# Patient Record
Sex: Male | Born: 1980 | Race: White | Hispanic: No | Marital: Single | Smoking: Current every day smoker
Health system: Southern US, Community
[De-identification: ages and names within clinical notes are randomized; demographics above are authoritative.]

## PROBLEM LIST (undated history)

## (undated) DIAGNOSIS — F319 Bipolar disorder, unspecified: Secondary | ICD-10-CM

## (undated) DIAGNOSIS — I10 Essential (primary) hypertension: Secondary | ICD-10-CM

## (undated) HISTORY — DX: Essential (primary) hypertension: I10

---
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
## 2000-11-16 ENCOUNTER — Encounter: Admission: RE | Admit: 2000-11-16 | Discharge: 2000-11-16 | Payer: Self-pay | Admitting: Nephrology

## 2000-11-16 ENCOUNTER — Encounter: Payer: Self-pay | Admitting: Nephrology

## 2001-09-16 ENCOUNTER — Emergency Department (HOSPITAL_COMMUNITY): Admission: EM | Admit: 2001-09-16 | Discharge: 2001-09-16 | Payer: Self-pay | Admitting: Emergency Medicine

## 2001-10-31 ENCOUNTER — Emergency Department (HOSPITAL_COMMUNITY): Admission: EM | Admit: 2001-10-31 | Discharge: 2001-11-01 | Payer: Self-pay | Admitting: Emergency Medicine

## 2002-01-20 ENCOUNTER — Inpatient Hospital Stay (HOSPITAL_COMMUNITY): Admission: EM | Admit: 2002-01-20 | Discharge: 2002-01-24 | Payer: Self-pay | Admitting: Psychiatry

## 2002-02-05 ENCOUNTER — Encounter: Payer: Self-pay | Admitting: Emergency Medicine

## 2002-02-05 ENCOUNTER — Emergency Department (HOSPITAL_COMMUNITY): Admission: EM | Admit: 2002-02-05 | Discharge: 2002-02-05 | Payer: Self-pay

## 2002-02-07 ENCOUNTER — Emergency Department (HOSPITAL_COMMUNITY): Admission: EM | Admit: 2002-02-07 | Discharge: 2002-02-07 | Payer: Self-pay | Admitting: *Deleted

## 2002-04-17 ENCOUNTER — Inpatient Hospital Stay (HOSPITAL_COMMUNITY): Admission: EM | Admit: 2002-04-17 | Discharge: 2002-04-26 | Payer: Self-pay | Admitting: Psychiatry

## 2003-06-02 ENCOUNTER — Inpatient Hospital Stay (HOSPITAL_COMMUNITY): Admission: EM | Admit: 2003-06-02 | Discharge: 2003-06-07 | Payer: Self-pay | Admitting: Psychiatry

## 2003-08-16 ENCOUNTER — Inpatient Hospital Stay (HOSPITAL_COMMUNITY): Admission: EM | Admit: 2003-08-16 | Discharge: 2003-08-19 | Payer: Self-pay | Admitting: Psychiatry

## 2003-10-22 ENCOUNTER — Inpatient Hospital Stay (HOSPITAL_COMMUNITY): Admission: EM | Admit: 2003-10-22 | Discharge: 2003-10-25 | Payer: Self-pay | Admitting: Psychiatry

## 2003-12-23 ENCOUNTER — Other Ambulatory Visit: Payer: Self-pay

## 2004-08-04 ENCOUNTER — Emergency Department: Payer: Self-pay | Admitting: Unknown Physician Specialty

## 2004-08-12 ENCOUNTER — Emergency Department (HOSPITAL_COMMUNITY): Admission: EM | Admit: 2004-08-12 | Discharge: 2004-08-13 | Payer: Self-pay

## 2004-08-21 ENCOUNTER — Emergency Department: Payer: Self-pay | Admitting: Emergency Medicine

## 2004-10-02 ENCOUNTER — Emergency Department: Payer: Self-pay | Admitting: Internal Medicine

## 2004-10-02 ENCOUNTER — Other Ambulatory Visit: Payer: Self-pay

## 2004-11-08 ENCOUNTER — Emergency Department: Payer: Self-pay | Admitting: Emergency Medicine

## 2004-11-27 ENCOUNTER — Emergency Department: Payer: Self-pay | Admitting: Emergency Medicine

## 2004-12-20 ENCOUNTER — Emergency Department: Payer: Self-pay | Admitting: Emergency Medicine

## 2004-12-24 ENCOUNTER — Emergency Department: Payer: Self-pay | Admitting: General Practice

## 2004-12-24 ENCOUNTER — Other Ambulatory Visit: Payer: Self-pay

## 2005-01-05 ENCOUNTER — Inpatient Hospital Stay (HOSPITAL_COMMUNITY): Admission: EM | Admit: 2005-01-05 | Discharge: 2005-01-08 | Payer: Self-pay | Admitting: Psychiatry

## 2005-01-05 ENCOUNTER — Ambulatory Visit: Payer: Self-pay | Admitting: Psychiatry

## 2005-03-21 ENCOUNTER — Emergency Department (HOSPITAL_COMMUNITY): Admission: EM | Admit: 2005-03-21 | Discharge: 2005-03-21 | Payer: Self-pay | Admitting: Emergency Medicine

## 2005-06-21 ENCOUNTER — Emergency Department: Payer: Self-pay | Admitting: Emergency Medicine

## 2005-08-24 ENCOUNTER — Emergency Department: Payer: Self-pay | Admitting: Emergency Medicine

## 2007-08-26 ENCOUNTER — Emergency Department: Payer: Self-pay | Admitting: Emergency Medicine

## 2007-11-01 ENCOUNTER — Emergency Department: Payer: Self-pay | Admitting: Emergency Medicine

## 2007-11-01 ENCOUNTER — Other Ambulatory Visit: Payer: Self-pay

## 2007-12-08 ENCOUNTER — Emergency Department: Payer: Self-pay | Admitting: Emergency Medicine

## 2007-12-12 ENCOUNTER — Emergency Department (HOSPITAL_COMMUNITY): Admission: EM | Admit: 2007-12-12 | Discharge: 2007-12-12 | Payer: Self-pay | Admitting: Emergency Medicine

## 2007-12-20 ENCOUNTER — Emergency Department (HOSPITAL_COMMUNITY): Admission: EM | Admit: 2007-12-20 | Discharge: 2007-12-22 | Payer: Self-pay | Admitting: Emergency Medicine

## 2008-01-10 ENCOUNTER — Inpatient Hospital Stay (HOSPITAL_COMMUNITY): Admission: RE | Admit: 2008-01-10 | Discharge: 2008-01-17 | Payer: Self-pay | Admitting: Psychiatry

## 2008-01-10 ENCOUNTER — Other Ambulatory Visit: Payer: Self-pay

## 2008-01-10 ENCOUNTER — Ambulatory Visit: Payer: Self-pay | Admitting: Psychiatry

## 2008-01-27 ENCOUNTER — Emergency Department (HOSPITAL_COMMUNITY): Admission: EM | Admit: 2008-01-27 | Discharge: 2008-01-27 | Payer: Self-pay | Admitting: Emergency Medicine

## 2008-02-02 ENCOUNTER — Inpatient Hospital Stay (HOSPITAL_COMMUNITY): Admission: RE | Admit: 2008-02-02 | Discharge: 2008-02-07 | Payer: Self-pay | Admitting: Psychiatry

## 2008-02-02 ENCOUNTER — Other Ambulatory Visit: Payer: Self-pay

## 2008-02-07 ENCOUNTER — Emergency Department (HOSPITAL_COMMUNITY): Admission: EM | Admit: 2008-02-07 | Discharge: 2008-02-07 | Payer: Self-pay | Admitting: Emergency Medicine

## 2008-02-08 ENCOUNTER — Ambulatory Visit: Payer: Self-pay | Admitting: Psychiatry

## 2008-02-08 ENCOUNTER — Inpatient Hospital Stay (HOSPITAL_COMMUNITY): Admission: AD | Admit: 2008-02-08 | Discharge: 2008-02-12 | Payer: Self-pay | Admitting: Psychiatry

## 2008-03-03 ENCOUNTER — Emergency Department (HOSPITAL_COMMUNITY): Admission: EM | Admit: 2008-03-03 | Discharge: 2008-03-03 | Payer: Self-pay | Admitting: Emergency Medicine

## 2008-03-04 ENCOUNTER — Emergency Department (HOSPITAL_COMMUNITY): Admission: EM | Admit: 2008-03-04 | Discharge: 2008-03-04 | Payer: Self-pay | Admitting: Emergency Medicine

## 2008-04-10 ENCOUNTER — Other Ambulatory Visit: Payer: Self-pay | Admitting: Emergency Medicine

## 2008-04-10 ENCOUNTER — Ambulatory Visit: Payer: Self-pay | Admitting: Psychiatry

## 2008-04-11 ENCOUNTER — Inpatient Hospital Stay (HOSPITAL_COMMUNITY): Admission: RE | Admit: 2008-04-11 | Discharge: 2008-04-18 | Payer: Self-pay | Admitting: Psychiatry

## 2008-05-13 ENCOUNTER — Other Ambulatory Visit: Payer: Self-pay

## 2008-05-13 ENCOUNTER — Other Ambulatory Visit: Payer: Self-pay | Admitting: Emergency Medicine

## 2008-05-14 ENCOUNTER — Ambulatory Visit: Payer: Self-pay | Admitting: *Deleted

## 2008-05-14 ENCOUNTER — Inpatient Hospital Stay (HOSPITAL_COMMUNITY): Admission: EM | Admit: 2008-05-14 | Discharge: 2008-05-16 | Payer: Self-pay | Admitting: *Deleted

## 2008-06-06 ENCOUNTER — Emergency Department (HOSPITAL_COMMUNITY): Admission: EM | Admit: 2008-06-06 | Discharge: 2008-06-08 | Payer: Self-pay | Admitting: Emergency Medicine

## 2008-07-18 ENCOUNTER — Other Ambulatory Visit: Payer: Self-pay | Admitting: Emergency Medicine

## 2008-07-18 ENCOUNTER — Other Ambulatory Visit: Payer: Self-pay

## 2008-07-19 ENCOUNTER — Inpatient Hospital Stay (HOSPITAL_COMMUNITY): Admission: RE | Admit: 2008-07-19 | Discharge: 2008-07-25 | Payer: Self-pay | Admitting: Psychiatry

## 2008-07-19 ENCOUNTER — Ambulatory Visit: Payer: Self-pay | Admitting: Psychiatry

## 2008-10-08 ENCOUNTER — Emergency Department (HOSPITAL_COMMUNITY): Admission: EM | Admit: 2008-10-08 | Discharge: 2008-10-08 | Payer: Self-pay | Admitting: Emergency Medicine

## 2008-10-15 ENCOUNTER — Other Ambulatory Visit: Payer: Self-pay | Admitting: Emergency Medicine

## 2008-10-16 ENCOUNTER — Inpatient Hospital Stay (HOSPITAL_COMMUNITY): Admission: EM | Admit: 2008-10-16 | Discharge: 2008-10-18 | Payer: Self-pay | Admitting: Psychiatry

## 2008-10-16 ENCOUNTER — Ambulatory Visit: Payer: Self-pay | Admitting: Psychiatry

## 2008-11-05 ENCOUNTER — Other Ambulatory Visit: Payer: Self-pay | Admitting: *Deleted

## 2008-11-06 ENCOUNTER — Inpatient Hospital Stay (HOSPITAL_COMMUNITY): Admission: EM | Admit: 2008-11-06 | Discharge: 2008-11-08 | Payer: Self-pay | Admitting: Psychiatry

## 2009-01-19 ENCOUNTER — Other Ambulatory Visit: Payer: Self-pay | Admitting: Emergency Medicine

## 2009-01-20 ENCOUNTER — Ambulatory Visit: Payer: Self-pay | Admitting: Psychiatry

## 2009-01-20 ENCOUNTER — Inpatient Hospital Stay (HOSPITAL_COMMUNITY): Admission: AD | Admit: 2009-01-20 | Discharge: 2009-01-24 | Payer: Self-pay | Admitting: Psychiatry

## 2009-05-05 ENCOUNTER — Emergency Department (HOSPITAL_COMMUNITY): Admission: EM | Admit: 2009-05-05 | Discharge: 2009-05-06 | Payer: Self-pay | Admitting: Emergency Medicine

## 2009-08-07 ENCOUNTER — Emergency Department (HOSPITAL_COMMUNITY): Admission: EM | Admit: 2009-08-07 | Discharge: 2009-08-07 | Payer: Self-pay | Admitting: Emergency Medicine

## 2009-09-22 IMAGING — CR RIGHT HAND - COMPLETE 3+ VIEW
1 series · 3 of 3 positions shown · non-contrast
Comparison: none

REASON FOR EXAM: fall
COMMENTS:

[Series 1: view not recorded · 0.17mm/px · 3 of 3 slices shown]
[im 1/3]
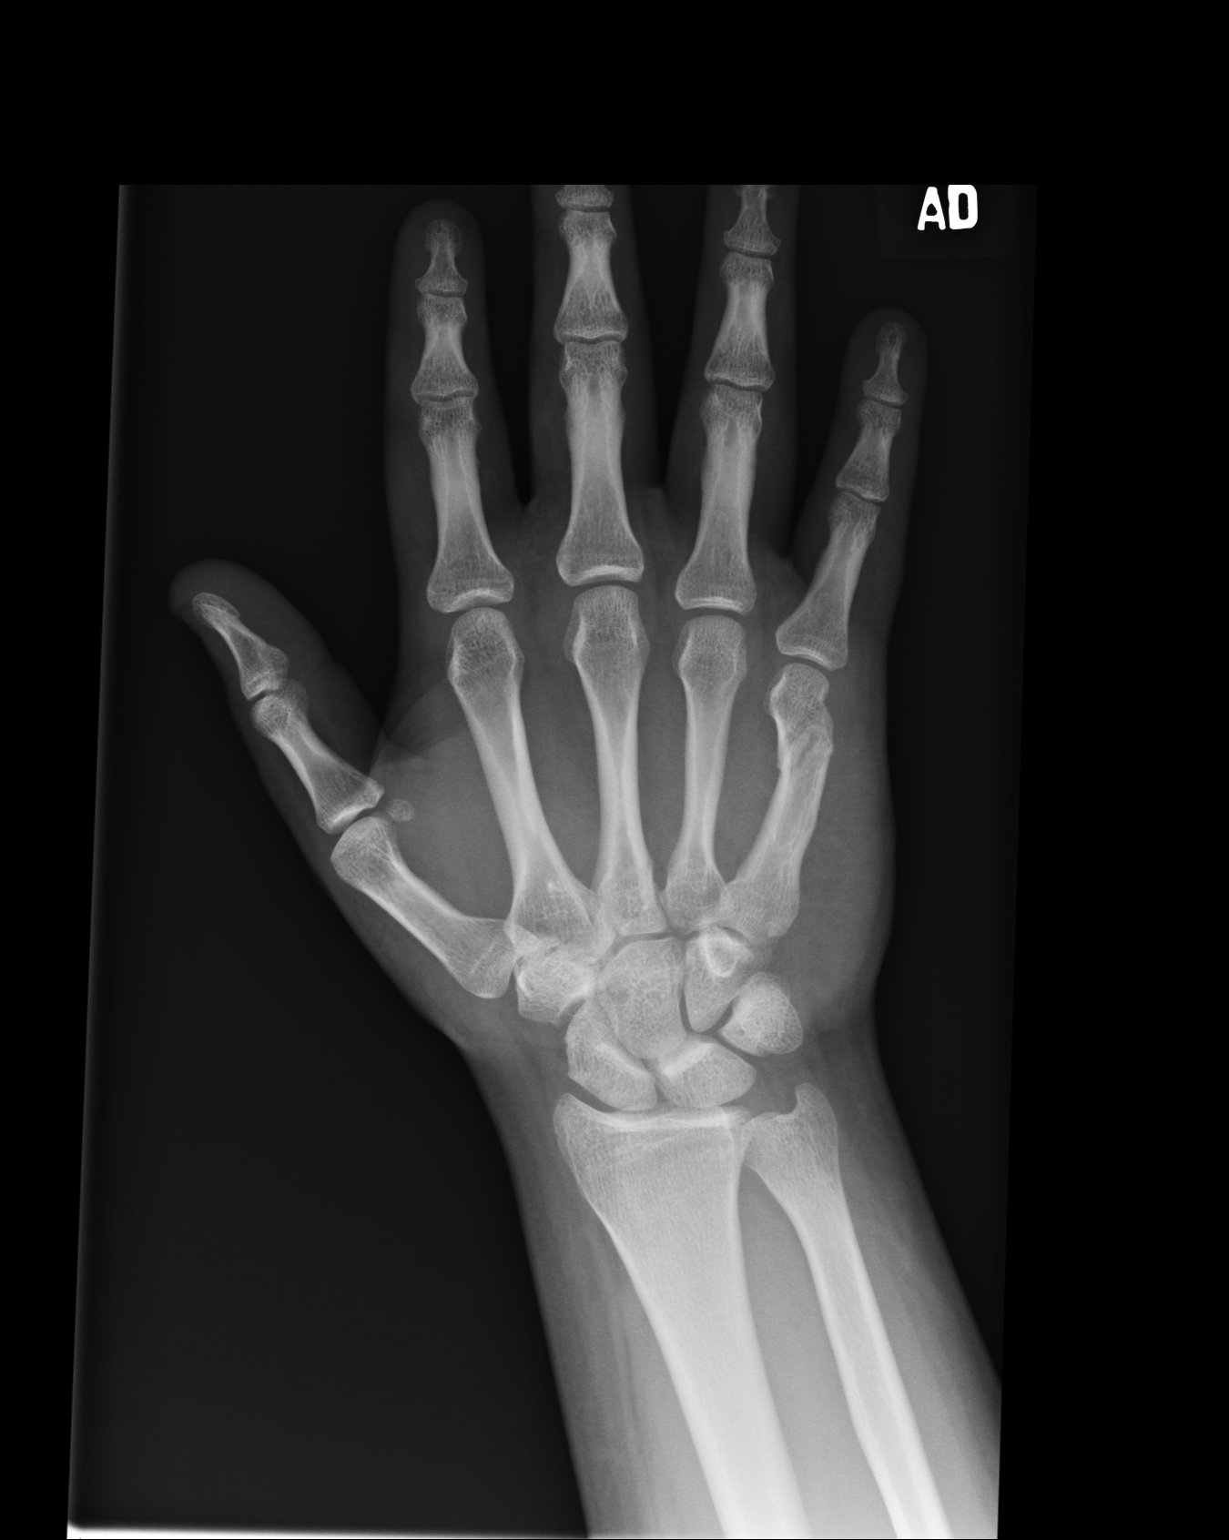
[im 2/3]
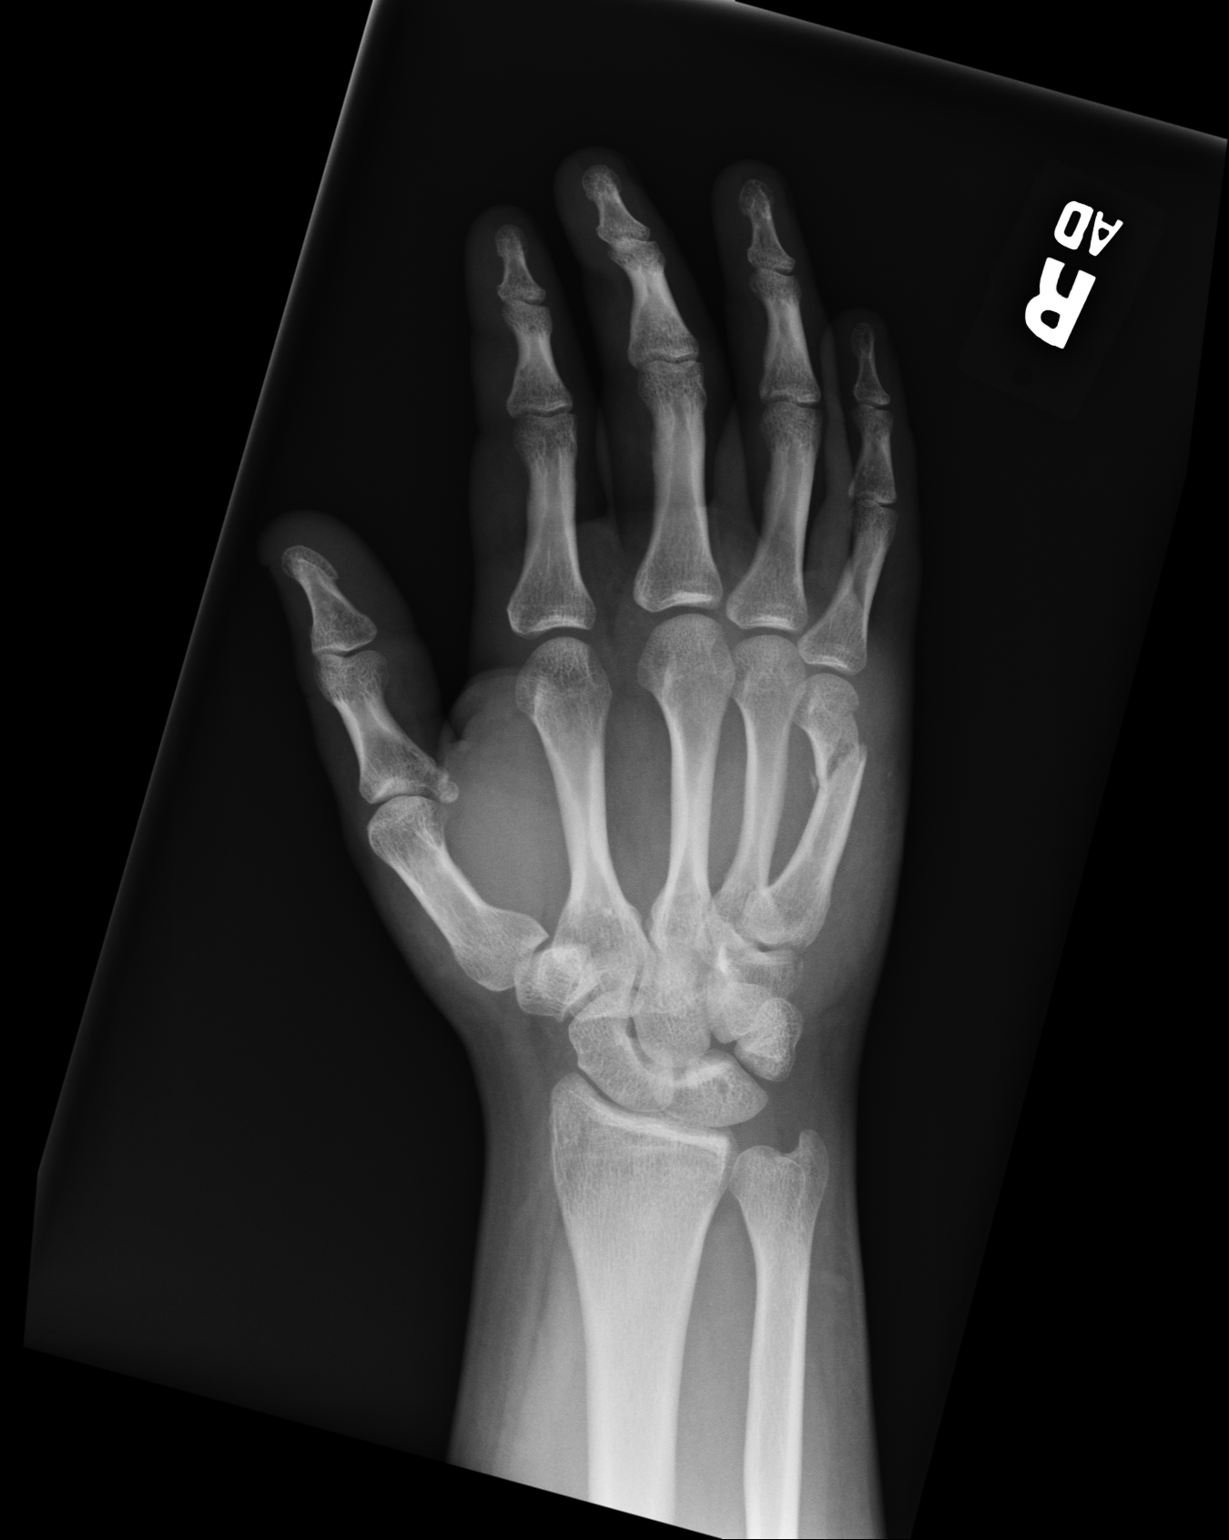
[im 3/3]
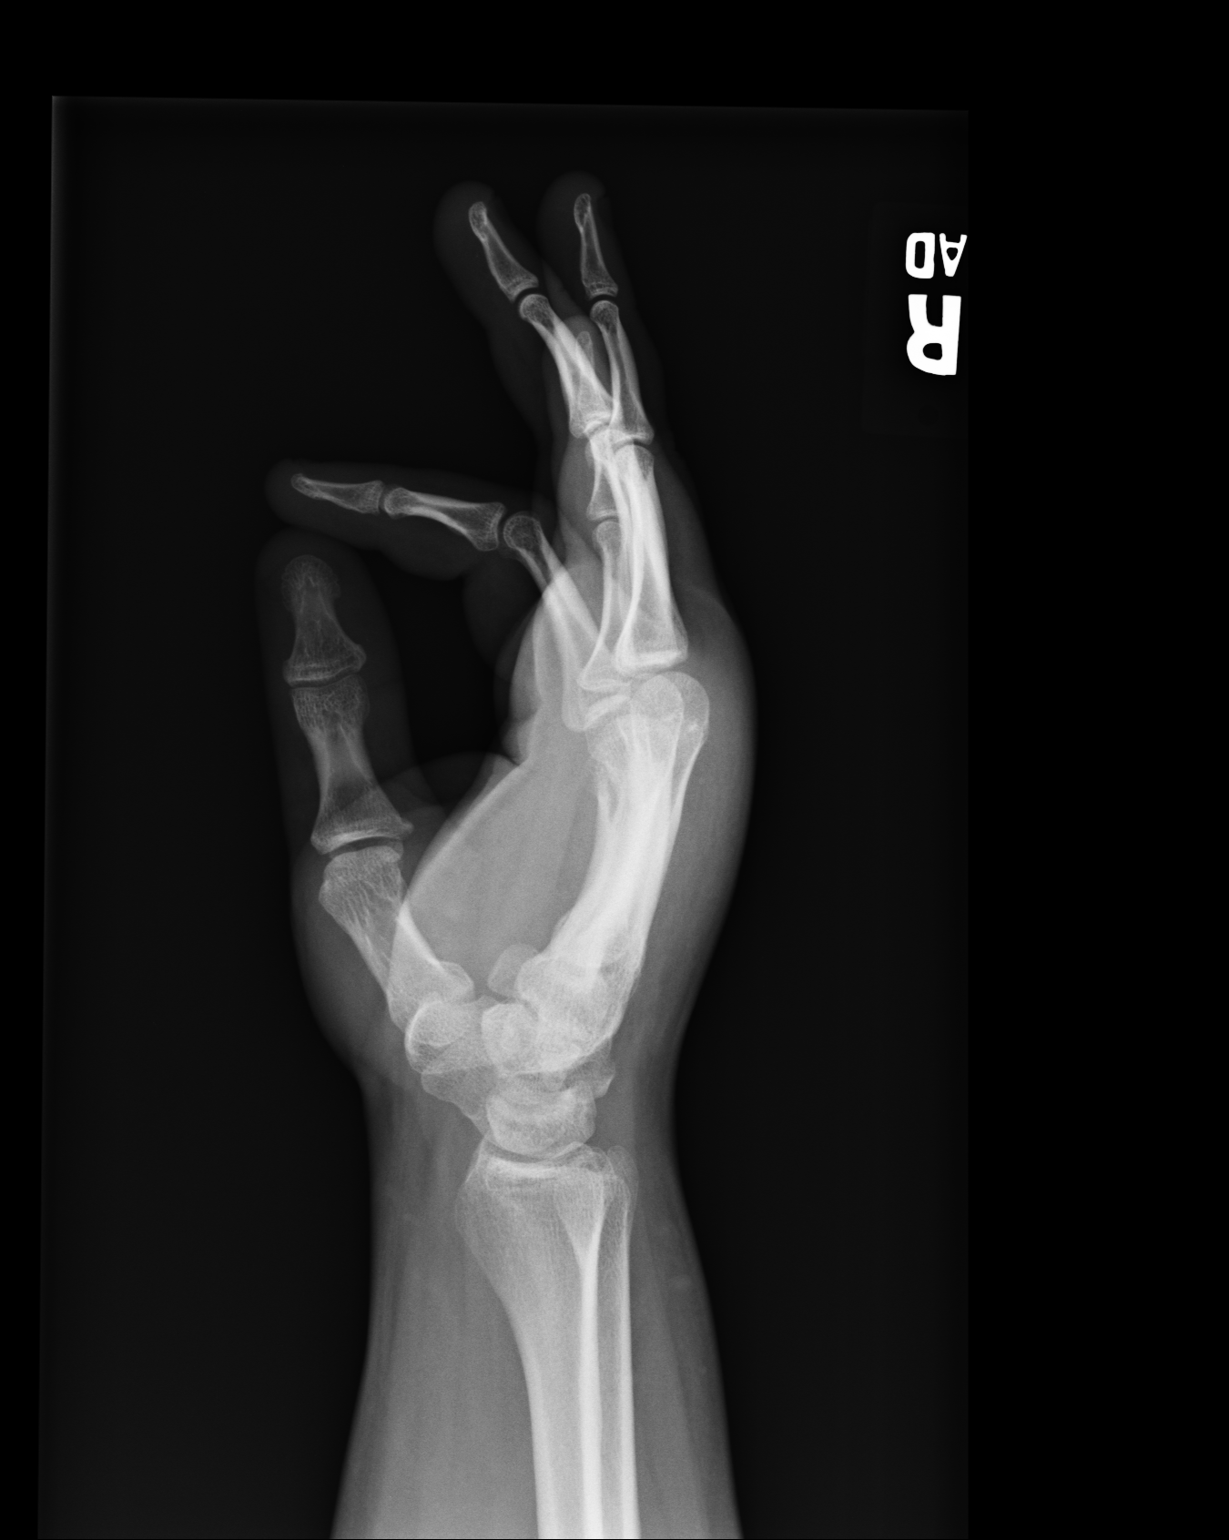

[3 of 3 positions shown; findings below may reference images not displayed]

PROCEDURE:     DXR - DXR HAND RT COMPLETE W/OBLIQUES  - August 26, 2007  [DATE]

RESULT:       A spiral partially comminuted fracture is demonstrated along
the distal aspect of the fifth metacarpal.  Distal fracture fragment
demonstrates dorsal angulation. No further evidence of fracture or
dislocation is appreciated.
IMPRESSION: Fifth metacarpal fracture as described above.

## 2010-01-08 IMAGING — CT CT HEAD W/O CM
1 series · 16 of 30 positions shown, 20 images · IV contrast (agent unspecified)
Comparison: None.

CLINICAL DATA: Headache. Fall.
 HEAD CT WITHOUT CONTRAST:
TECHNIQUE: Contiguous axial images were obtained from the base of the skull through the vertex according to standard protocol without contrast.

[Series 2: head routine 4.8 h47s · axial · 0.46mm/px · z∈[+1310,+1467]mm · 16 of 36 slices shown, 20 images]
[im 2/36  brain]
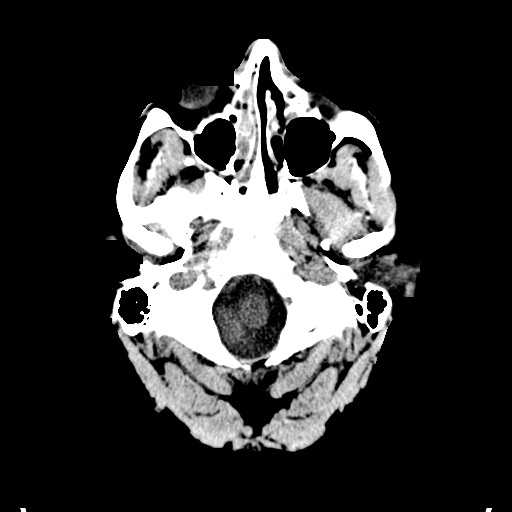
[im 2/36  bone]
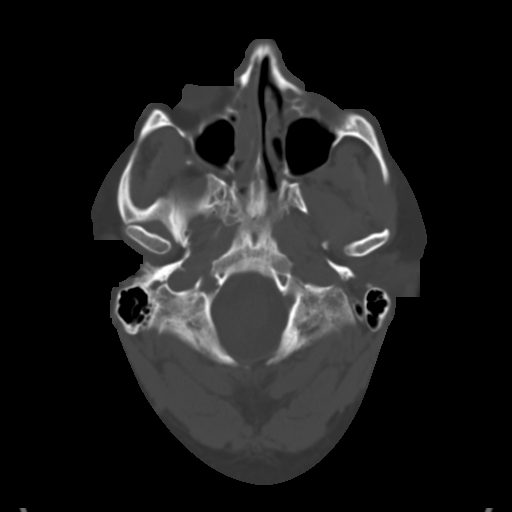
[im 4/36  brain]
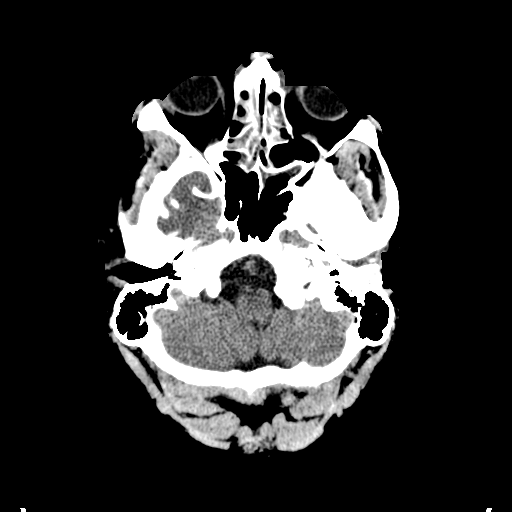
[im 7/36  brain]
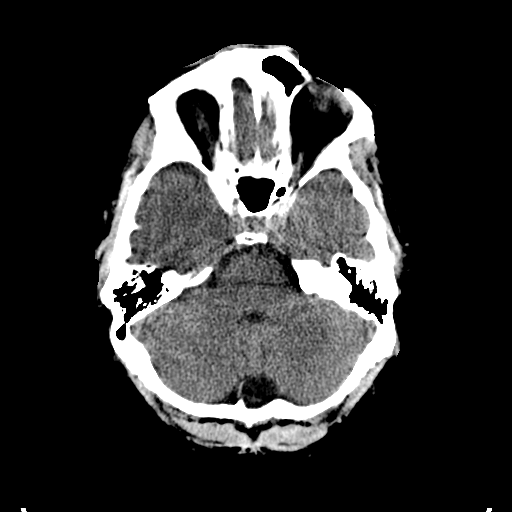
[im 9/36  brain]
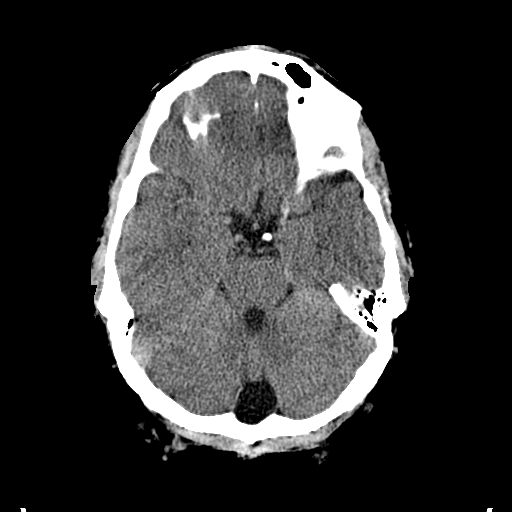
[im 10/36  brain]
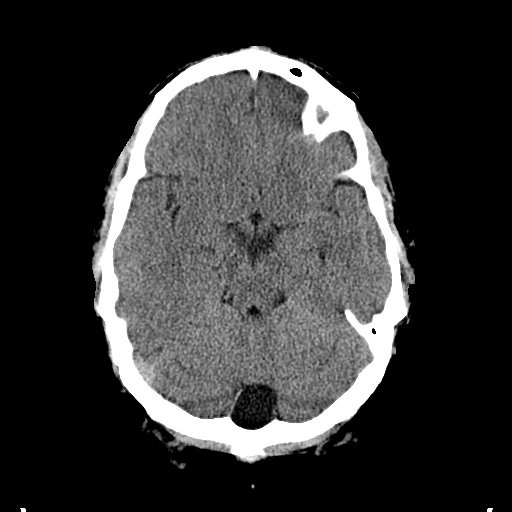
[im 10/36  bone]
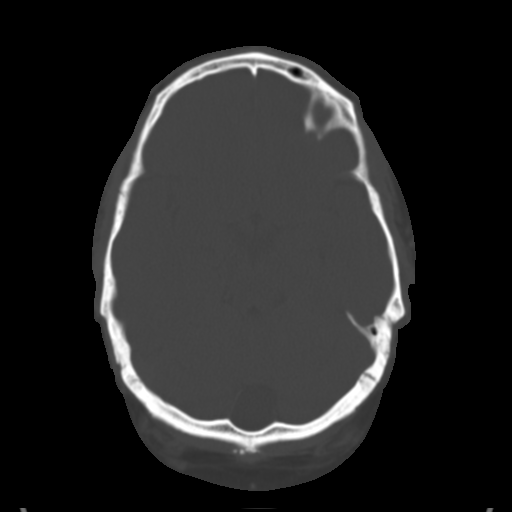
[im 13/36  brain]
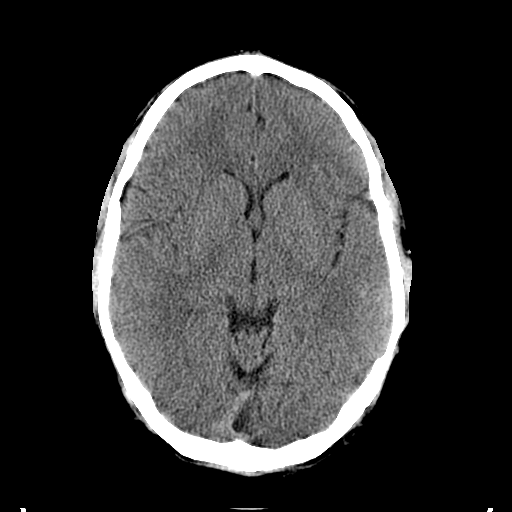
[im 15/36  brain]
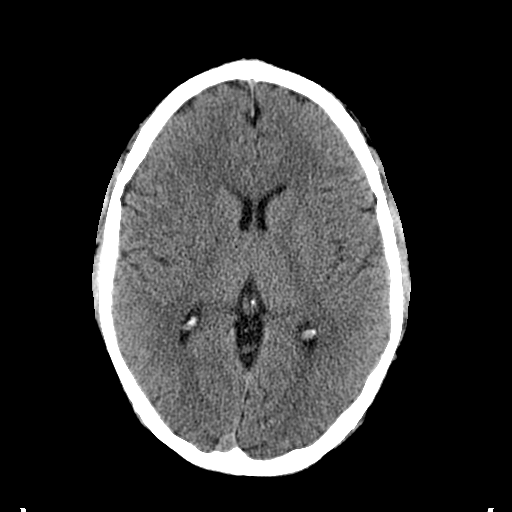
[im 17/36  brain]
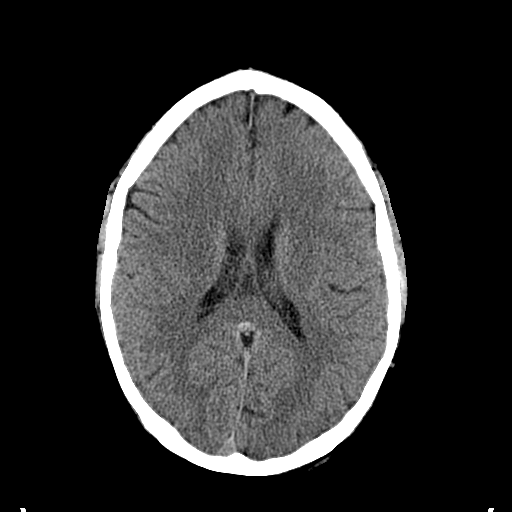
[im 19/36  brain]
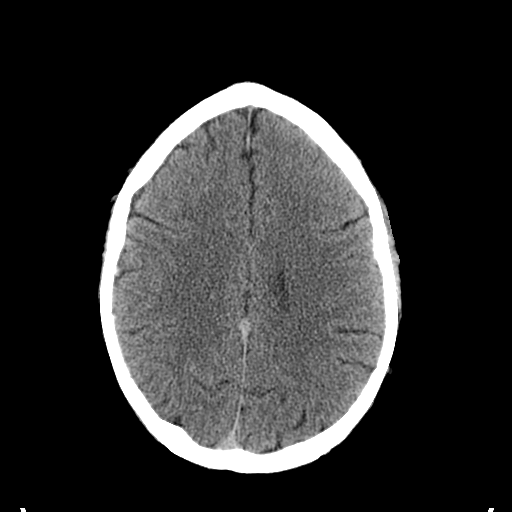
[im 19/36  bone]
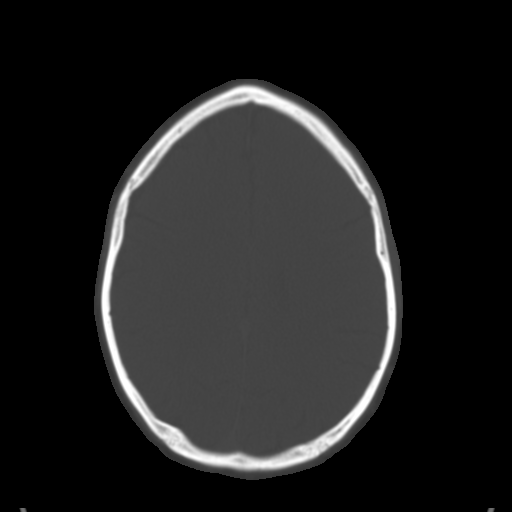
[im 21/36  brain]
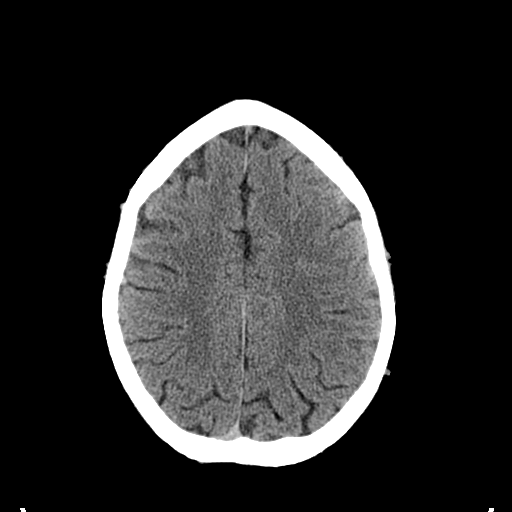
[im 23/36  brain]
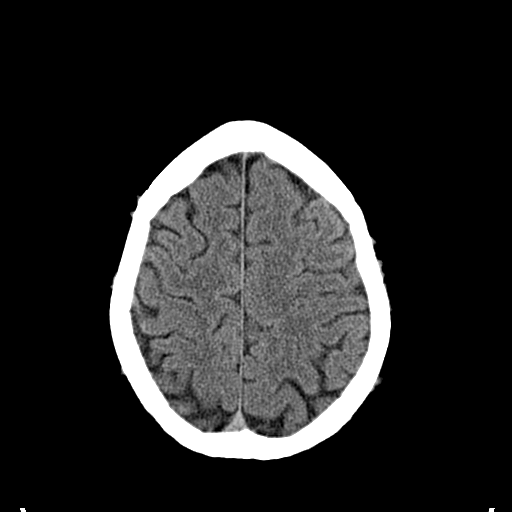
[im 26/36  brain]
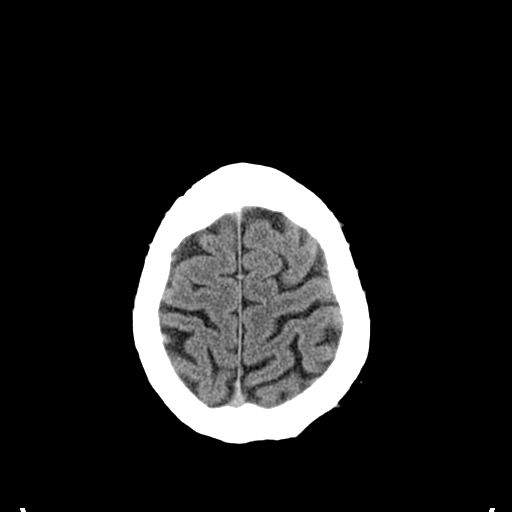
[im 27/36  brain]
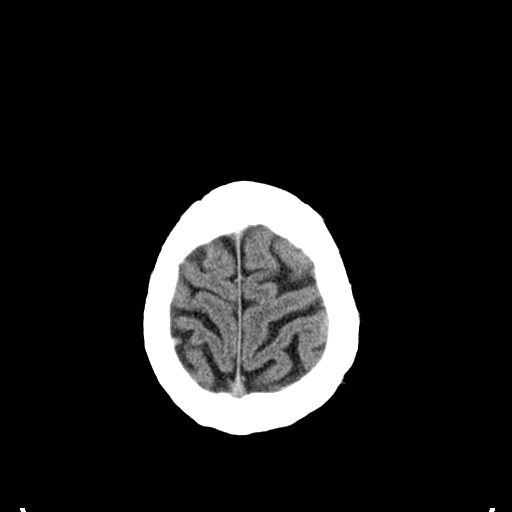
[im 27/36  bone]
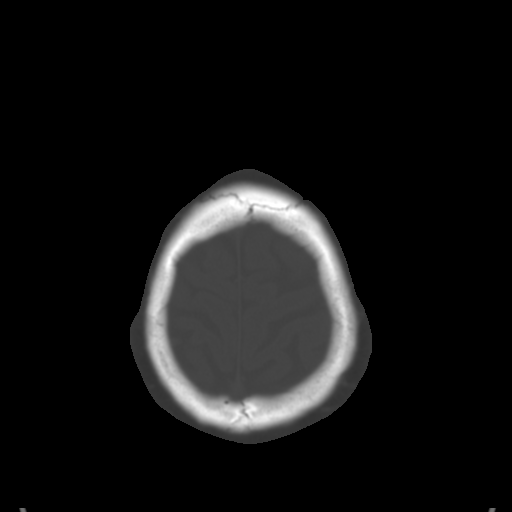
[im 29/36  brain]
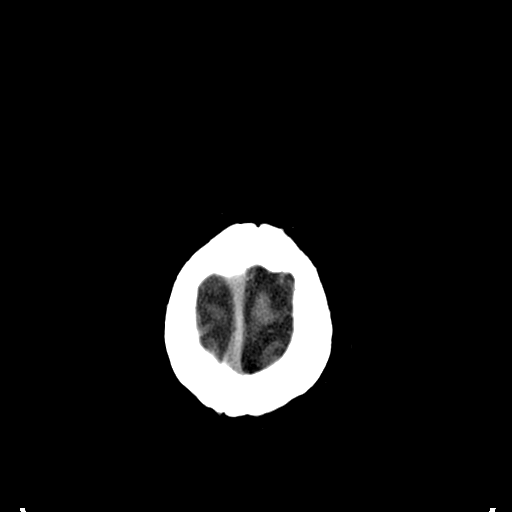
[im 32/36  brain]
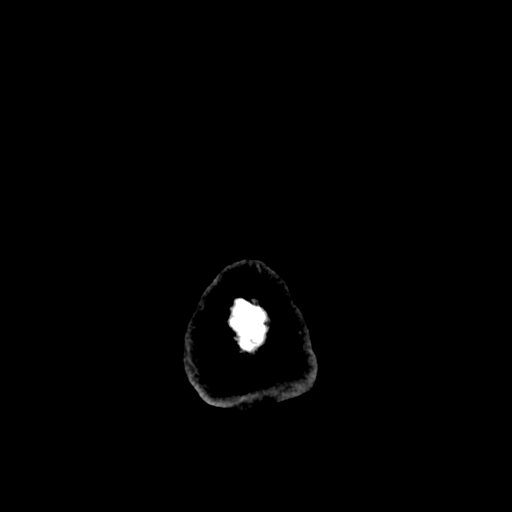
[im 34/36  brain]
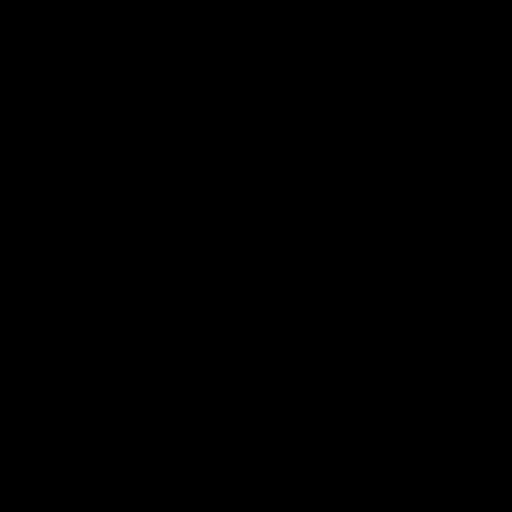

[16 of 30 positions shown; findings below may reference images not displayed]

FINDINGS: There is no evidence of intracranial hemorrhage, brain edema, acute infarct, mass lesion, or mass effect.  No other intra-axial abnormalities are seen, and the ventricles are within normal limits.  No abnormal extra-axial fluid collections or masses are identified.  No skull abnormalities are noted. There is chronic sinusitis in the ethmoid sinuses bilaterally.
IMPRESSION: Negative non-contrast head CT.

## 2010-03-31 IMAGING — CR DG CHEST 2V
2 series · 2 of 2 positions shown · non-contrast
Comparison: None available currently.

CLINICAL DATA: Chest pain, shortness of breath and left arm pain

CHEST - 2 VIEW

[w chest pa]
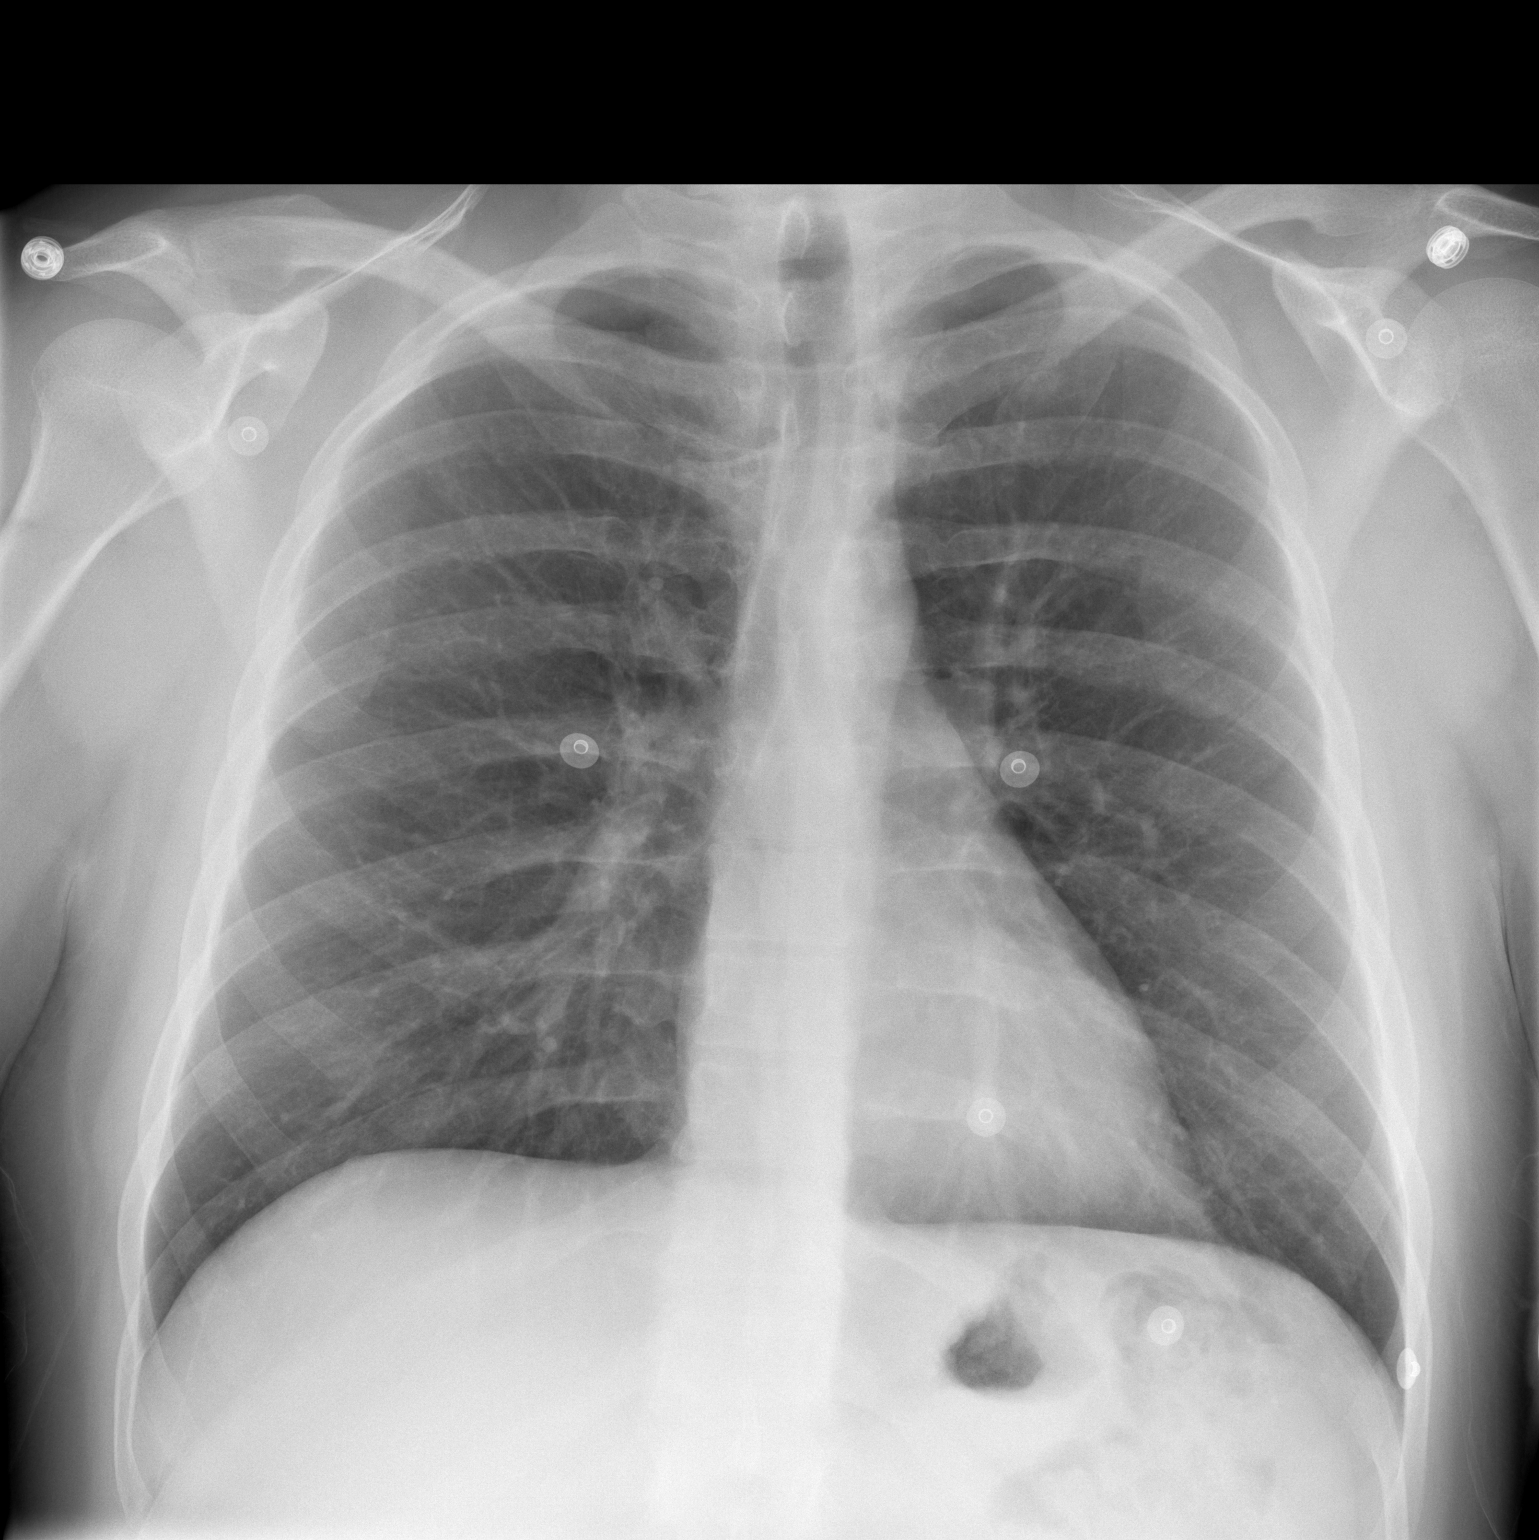

[w chest lat]
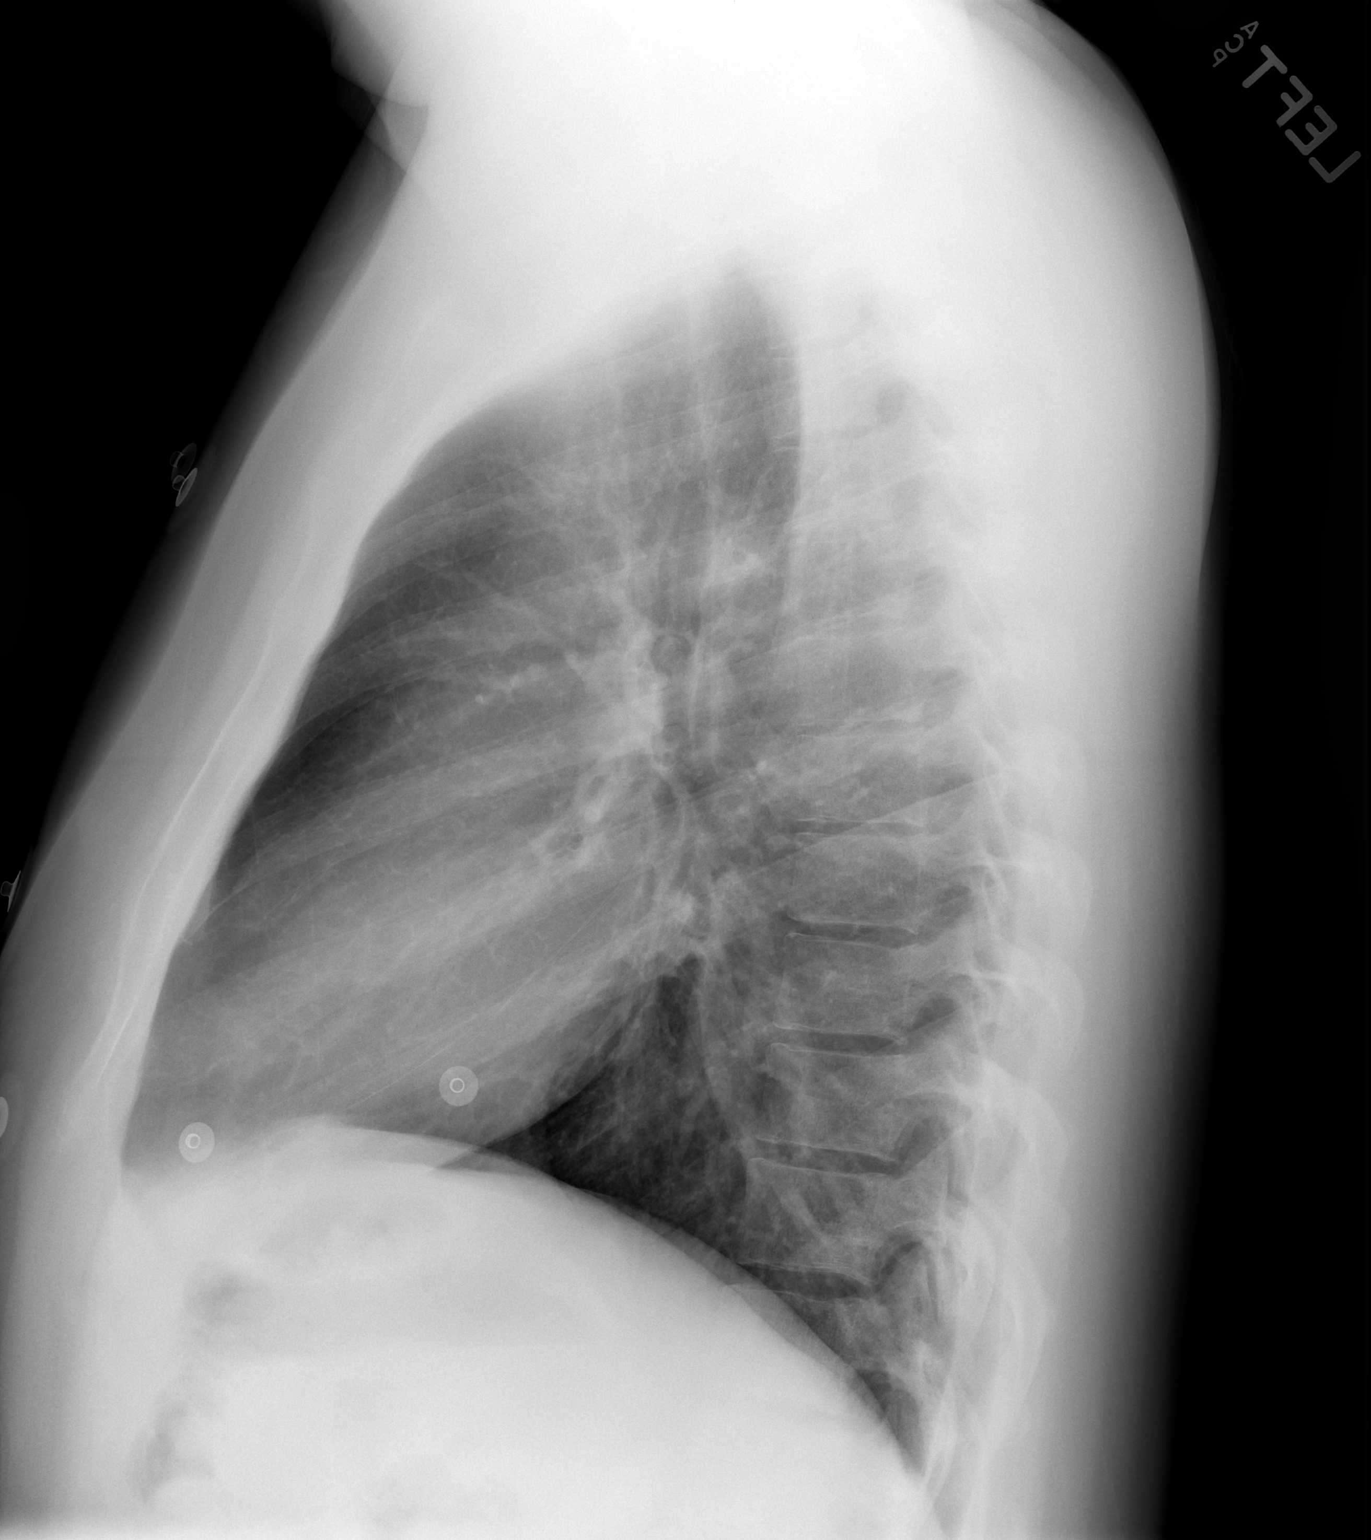

[2 of 2 positions shown; findings below may reference images not displayed]

FINDINGS: Lungs are clear.  No edema or infiltrate.  No
pneumothorax.  No pleural fluid.

Cardiac and mediastinal contours are within normal limits.  The
bony thorax is unremarkable.
IMPRESSION: No active disease.

## 2011-02-04 LAB — DIFFERENTIAL
Basophils Absolute: 0 10*3/uL (ref 0.0–0.1)
Basophils Relative: 0 % (ref 0–1)
Monocytes Relative: 10 % (ref 3–12)
Neutro Abs: 6.1 10*3/uL (ref 1.7–7.7)
Neutrophils Relative %: 61 % (ref 43–77)

## 2011-02-04 LAB — BASIC METABOLIC PANEL
CO2: 29 mEq/L (ref 19–32)
Calcium: 9.2 mg/dL (ref 8.4–10.5)
Creatinine, Ser: 0.95 mg/dL (ref 0.4–1.5)
GFR calc Af Amer: >60 mL/min (ref >60)

## 2011-02-04 LAB — CBC
MCHC: 34.6 g/dL (ref 30.0–36.0)
RBC: 5.03 MIL/uL (ref 4.22–5.81)

## 2011-02-04 LAB — POCT CARDIAC MARKERS
CKMB, poc: <1 ng/mL — ABNORMAL LOW (ref 1.0–8.0)
Myoglobin, poc: 37.6 ng/mL (ref 12–200)

## 2011-02-11 LAB — BASIC METABOLIC PANEL
GFR calc Af Amer: >60 mL/min (ref >60)
GFR calc non Af Amer: >60 mL/min (ref >60)
Potassium: 4.3 mEq/L (ref 3.5–5.1)
Sodium: 137 mEq/L (ref 135–145)

## 2011-02-11 LAB — ETHANOL: Alcohol, Ethyl (B): <5 mg/dL (ref 0–10)

## 2011-02-11 LAB — DIFFERENTIAL
Eosinophils Relative: 0 % (ref 0–5)
Lymphocytes Relative: 21 % (ref 12–46)
Lymphs Abs: 2.2 10*3/uL (ref 0.7–4.0)
Monocytes Absolute: 0.8 10*3/uL (ref 0.1–1.0)
Neutro Abs: 7.5 10*3/uL (ref 1.7–7.7)

## 2011-02-11 LAB — CBC
HCT: 46.5 % (ref 39.0–52.0)
Hemoglobin: 16.4 g/dL (ref 13.0–17.0)
RBC: 5.21 MIL/uL (ref 4.22–5.81)
WBC: 10.6 10*3/uL — ABNORMAL HIGH (ref 4.0–10.5)

## 2011-02-11 LAB — RAPID URINE DRUG SCREEN, HOSP PERFORMED
Opiates: NOT DETECTED
Tetrahydrocannabinol: POSITIVE — AB

## 2011-02-15 LAB — RAPID URINE DRUG SCREEN, HOSP PERFORMED
Amphetamines: NOT DETECTED
Barbiturates: NOT DETECTED
Benzodiazepines: NOT DETECTED
Cocaine: NOT DETECTED

## 2011-02-15 LAB — HEPATIC FUNCTION PANEL
AST: 24 U/L (ref 0–37)
Bilirubin, Direct: <0.1 mg/dL (ref 0.0–0.3)
Total Bilirubin: 0.5 mg/dL (ref 0.3–1.2)

## 2011-02-15 LAB — BASIC METABOLIC PANEL
BUN: 7 mg/dL (ref 6–23)
Chloride: 104 mEq/L (ref 96–112)
Creatinine, Ser: 1.01 mg/dL (ref 0.4–1.5)
Glucose, Bld: 98 mg/dL (ref 70–99)

## 2011-02-15 LAB — DIFFERENTIAL
Basophils Absolute: 0.1 10*3/uL (ref 0.0–0.1)
Basophils Relative: 1 % (ref 0–1)
Eosinophils Absolute: 0.1 10*3/uL (ref 0.0–0.7)
Eosinophils Relative: 0 % (ref 0–5)
Lymphs Abs: 2.4 10*3/uL (ref 0.7–4.0)
Neutrophils Relative %: 71 % (ref 43–77)

## 2011-02-15 LAB — CBC
HCT: 46.6 % (ref 39.0–52.0)
MCV: 89 fL (ref 78.0–100.0)
Platelets: 154 10*3/uL (ref 150–400)
RDW: 13.2 % (ref 11.5–15.5)
WBC: 11.9 10*3/uL — ABNORMAL HIGH (ref 4.0–10.5)

## 2011-02-15 LAB — TSH: TSH: 2.113 u[IU]/mL (ref 0.350–4.500)

## 2011-02-15 LAB — RPR: RPR Ser Ql: NONREACTIVE

## 2011-02-15 LAB — TRICYCLICS SCREEN, URINE: TCA Scrn: NOT DETECTED

## 2011-02-15 LAB — ETHANOL: Alcohol, Ethyl (B): <5 mg/dL (ref 0–10)
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
## 2011-03-16 NOTE — H&P (Signed)
NAMEWILBERN, PENNYPACKER NO.:  [id number]   MEDICAL RECORD NO.:  [id number]          PATIENT TYPE:  IPS   LOCATION:  0507                          FACILITY:  BH   PHYSICIAN:  Geoffery Lyons, M.D.      DATE OF BIRTH:  [birth year]   DATE OF ADMISSION:  01/10/2008  DATE OF DISCHARGE:                       PSYCHIATRIC ADMISSION ASSESSMENT   IDENTIFICATION:  30 year old married and widowed white male.  This is a  voluntary admission.   HISTORY OF PRESENT ILLNESS:  This is a second or third admission for  this 30 year old who are relapsed two and a half weeks ago after  2-1/2  years of abstinence from substances.  He says now he has is up to  drinking between three and five 40-ounces beers daily, also had been  using cocaine, snorting powder about every other day for the past couple  of weeks and using marijuana most every day.  He says that he is  stressed over grief and guilt of his ex-wife who committed suicide  shortly here now before their  divorce was to become finalized.  She  committed suicide about 3 weeks ago.  She was having a lot of difficulty  coping with their separation and she left three children behind ages 30,  23 and 26 years old, now living with the grandmother in [place].  He also  has been suffering from depression and attempted to cut his wrist a  couple of months ago, ended up being admitted to [facility] where he was started on Abilify 10 mg daily and trazodone.  Until the suicide then he had been fairly stable on these medications  and has been working with the [facility] support group.  Endorses  depressed mood with no active suicidal thoughts today.   PAST PSYCHIATRIC HISTORY:  This is his third [facility] admission, last  admission January 05, 2005 to January 08, 2005 for polysubstance abuse and  mood disorder.  He had also at that time been having some auditory  hallucinations, has a history of prior suicide attempts including  attempting to  jump off a bridge at one point and a history of attempting  suicide by hanging himself.  Had a history of substance induced mood  problems including some slight psychosis involving paranoia and auditory  hallucinations when using substances.  Most recent hospitalization was  at [facility] as noted above approximately 2 months ago.  He is followed by the [facility] support group and [facility] mental  health.   SOCIAL HISTORY:  Currently unemployed, was previously working in  Aeronautical engineer, last worked about 8 months ago.  He is currently living in  [place] and he is essentially homeless at this point.  Has three  children ages 89, 44 and 56 years old, now living with the grandmother in  [place].  He had previously been living in his own apartment and then lost  that because of his inability to pay rent.  He has no legal problems.  Has been at [facility] but can no longer stay there.  MEDICAL HISTORY:  The patient has no regular primary care Maximiano Lott.  Medical problems are none.  Past medical history remarkable for smoking  about two packs of cigarettes per day, history of mood disorder and  polysubstance abuse.  Current medications are Abilify 10 mg daily,  trazodone 100 mg at bedtime.   DRUG ALLERGIES.:  No known drug allergies.   PHYSICAL EXAM:  Was done in the emergency room.  Well-nourished, well-  developed, stocky build white male in no distress, pleasant and  cooperative here.  5 feet 11 inches tall, 227 pounds, temperature 97.8,  pulse 74, respirations 20, blood pressure 128/83.  Physical exam was  remarkable for some tattoos on his right scapula and his right inner  forearm.   DIAGNOSTIC STUDIES:  Were done in the emergency room.  Chemistry, sodium  139, potassium 4.3, chloride 105, carbon dioxide 28, BUN 11, creatinine  1.07 and random glucose 86.  His liver enzymes were within normal  limits.  SGOT 22, SGPT 39, alkaline phosphatase 71 and total  bilirubin  of 1.0.  Calcium normal at 9.8.  CBC, WBC 10.3, hemoglobin 16.2,  hematocrit 46.8, platelets 155,000.  His urine drug screen was positive  for marijuana.  Alcohol level less than five.   MENTAL STATUS EXAM:  Fully alert male, cooperative, with a blunted and  depressed appearing affect, appropriate, polite.  Speech normal in pace,  tone and production and form.  Gives a coherent history.  Fully engaged  in conversation, eye contact is variable but adequate.  Mood is  depressed.  Thought process logical and coherent.  No active suicidal  thoughts today.  No flight of ideas or paranoia.  No guarding.  No  symptoms of psychosis.  He is oriented x4.  Insight is adequate.  Impulse control and judgment are normal.  He is asking for help,  thinking that he could best achieve abstinence again if he could get  back to an [facility] where he can participate with the community that  was not using substances.  Has been actively attending groups and  working with a peers and staff.   AXIS I:  EtOH abuse and dependence.  Cocaine abuse rule out dependence,  polysubstance abuse, depressive disorder NOS.  AXIS II:  Deferred AXIS  III:  No diagnosis  AXIS IV:  Severe, homeless.  AXIS V:  Current 42 past year not known.   PLAN:  Is to voluntarily admit the patient with q. 15-minute checks in  place.  He has said that he does get cocaine cravings and feels that the  amantadine 100 mg b.i.d. did help him in the past with cocaine cravings.  Meanwhile, we started him on a Librium detox protocol and he is  tolerating that well.  Will receive 25 mg  q.i.d. today and then in  tapering fashion over the next 4 days along with 100 mg of thiamine  daily and multivitamin and medications for withdrawal symptoms.  He  plans on following up with [facility] and [facility] mental health.      Margaret A. Scott, N.P.      Geoffery Lyons, M.D.  Electronically Signed    MAS/MEDQ  D:  01/11/2008   T:  01/12/2008  Job:  [id number]
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
## 2011-03-19 NOTE — Discharge Summary (Signed)
NAME:  [name], [name] NO.:  [id number]   MEDICAL RECORD NO.:  [id number]                   PATIENT TYPE:  IPS   LOCATION:  0504                                 FACILITY:  BH   PHYSICIAN:  Geoffery Lyons, M.D.                   DATE OF BIRTH:  1981/01/09   DATE OF ADMISSION:  06/02/2003  DATE OF DISCHARGE:  06/07/2003                                 DISCHARGE SUMMARY   CHIEF COMPLAINT AND PRESENT ILLNESS:  This was the second admission to [facility] for this 30 year old single white male voluntarily  admitted.  History of depression, suicidal thoughts, hearing non-command  voices, hearing whispering.  Using cocaine and marijuana for years.  His  last use was three days prior to this admission.  Reported he has been  drinking six beers the day prior to this admission.  History of being  detoxified in the past, very guilty, depressed.  Decreased sleep, decreased  appetite, 44-pound weight loss.  Currently reporting positive auditory  hallucinations but no visual hallucinations or paranoia.   PAST PSYCHIATRIC HISTORY:  Second time at [facility].  Here in June  for psychotic features.  Been seen in [place].   ALCOHOL/DRUG HISTORY:  Drinking 1-1/2 packs of beer, almost daily.   PAST MEDICAL HISTORY:  Bronchitis and gastroesophageal reflux.   MEDICATIONS:  Seroquel 400 mg at night and Protonix.   PHYSICAL EXAMINATION:  Performed and failed to show any acute findings.   LABORATORY DATA:  UDS positive for marijuana.  CMET showed glucose 118,  hematocrit 54.   MENTAL STATUS EXAM:  Alert, tall, well-nourished male, cooperative with fair  eye contact.  Speech is clear.  Mood is depressed.  Affect is flat.  Thought  processes are positive for auditory hallucinations.  No visual  hallucinations.  No paranoia or delusions.  Cognition well-preserved.   ADMISSION DIAGNOSES:   AXIS I:  1. Psychotic disorder not otherwise  specified.  2. Alcohol dependence.  3. Marijuana abuse.   AXIS II:  No diagnosis.   AXIS III:  1. Gastroesophageal reflux disease.  2. Bronchitis.   AXIS IV:  Moderate.   AXIS V:  Global Assessment of Functioning upon admission 35; highest Global  Assessment of Functioning in the last year 55.   LABORATORY DATA:  Thyroid profile within normal limits.   HOSPITAL COURSE:  He was admitted and started intensive individual and group  psychotherapy.  He was given some Librium 25 mg p.r.n. for withdrawal.  He  was placed on trazodone, Seroquel 100 mg at night and Lexapro 5 mg per day.  Seroquel was increased to 150 mg at night.  He reported that he started  using drugs, also endorsed hearing voices but he started sleeping better,  understanding that he needed to abstain from substances.  Would like to go  to a residential  treatment center, [facility].  Voices started decreasing.  His  mood improved.  His affect became brighter.  On June 07, 2003, he was fully  detoxed.  No withdrawal and the voices were gone.  Mood was improved.  Looking at long-term abstinence.  Going to [facility] where they were going to help him get into [facility].  Motivated and  positive about this plan.   DISCHARGE DIAGNOSES:   AXIS I:  1. Alcohol dependence.  2. Polysubstance abuse.  3. Psychotic disorder not otherwise specified.  4. Depressive disorder not otherwise specified.   AXIS II:  No diagnosis.   AXIS III:  1. Gastroesophageal reflux disease.  2. Bronchitis.   AXIS IV:  Moderate.   AXIS V:  Global Assessment of Functioning upon discharge 55.   DISCHARGE MEDICATIONS:  1. Lexapro 10 mg daily.  2. Protonix 40 mg daily.  3. Seroquel 100 mg, 1-1/2 at bedtime.  4. Augmentin 875 mg twice a day.  5. Trazodone 100 mg at bedtime for sleep.   FOLLOW UP:  Referred to [facility] for referral to [facility].                                               Geoffery Lyons, M.D.     IL/MEDQ  D:  07/03/2003  T:  07/05/2003  Job:  [id number]

## 2011-03-19 NOTE — H&P (Signed)
[facility]  Patient:    [name], [name] Visit Number: [id number] MRN: [id number]          Service Type: PSY Location: [id number] Attending Physician:  Jeanice Lim Dictated by:   Candi Leash. Orsini, N.P. Admit Date:  01/20/2002                     Psychiatric Admission Assessment  IDENTIFYING INFORMATION:  This is a 30 year old single white male involuntarily committed for suicidal ideation and psychosis on January 20, 2002.  HISTORY OF PRESENT ILLNESS:  The patient presents with a history of depression for one week, feeling very tired and weak, with suicidal thoughts with no specific plan.  The patient reports a history of positive auditory hallucinations for years but voices have intensified, telling him to "kill himself."  Also experiencing positive visual hallucinations and paranoid ideation, feeling the devil is after him, positive tactile hallucinations, feeling the devil has been crawling on him.  He reports he has been drinking and smoking marijuana to rid himself of these hallucinations.  He reports his sleep and appetite has been satisfactory.  Denies any specific stressors.  He reports he has been feeling shaky this morning from alcohol withdrawal.  PAST PSYCHIATRIC HISTORY:  First hospitalization to [facility]. Has been in [place] two months ago for depression and suicidal thoughts.  He sees Dr. ______ on an outpatient basis.  Last visit was 1-1/2 months ago. The patient has a history of paranoid schizophrenia with no history of detox and longest history of sobriety has been two months.  SOCIAL HISTORY:  This is a 30 year old single white male with no children. Lives in [facility] for the past 3-4 months.  He has completed high school.  No legal problems.  He has been out of work for the past 6-7 months.  FAMILY HISTORY:  None.  ALCOHOL/DRUG HISTORY:  The patient smokes.  He has been drinking a pint of liquor every  other day for approximately a year.  He drinks with his friend. He has no history of blackouts or seizures.  His last drink was on Friday, drinking more than one pint.  He also states that he smokes pot every day.  PRIMARY CARE Eric Nees:  None.  MEDICAL PROBLEMS:  None.  MEDICATIONS:  The patient has been on Benadryl, Prevacid, Abilify 15 mg for one week and Lexapro 10 mg x 1 week.  DRUG ALLERGIES:  None.  PHYSICAL EXAMINATION:  Performed at [facility].  Blood pressure was 128/66.  LABORATORY DATA:  CBC was within normal limits.  CMET within normal limits. Urine drug screen was positive for THC.  Alcohol level was 58.  MENTAL STATUS EXAMINATION:  He is an alert, young adult, Caucasian male, cooperative, fair eye contact.  He is casually dressed.  Speech is normal and relevant.  Mood is depressed.  Affect is blunt and void of emotion.  Thought processes are coherent.  There is no current psychosis.  No auditory or visual hallucinations.  No suicidal or homicidal ideation.  No tactile hallucinations or paranoia.  Cognitive functioning intact.  Memory is fair.  Judgment is fair.  Insight is fair.  DIAGNOSES: Axis I:    1. Schizophrenia, paranoid-type.            2. Depressive disorder. Axis II:   Deferred. Axis III:  None. Axis IV:   Problems with social environment, occupation, other psychosocial  problems. Axis V:    Current 20; estimated this past year 60.  PLAN:  Voluntary admission for depression, suicidal ideation, psychosis. Contract for safety.  Check every 15 minutes.  The patient will be placed on the 400 Hall for close monitoring.  Will initiate Risperdal for psychosis and phenobarbital protocol for alcohol withdrawal.  Will resume Lexapro and dosage to be increased to decrease depressive symptoms.  Will obtain labs.  The patient was educated on medication compliance and to remain drug and alcohol-free after discharge.  Our goal is to  decrease the psychotic symptoms and depressive symptoms so patient can be safe and functional, to attend AA and follow up with Dr. ________.  TENTATIVE LENGTH OF STAY:  Four to five days. Dictated by:   Candi Leash. Orsini, N.P. Attending Physician:  Jeanice Lim DD:  01/21/02 TD:  01/21/02 Job: [id number]

## 2011-03-19 NOTE — Discharge Summary (Signed)
[name], [name] NO.:  [id number]   MEDICAL RECORD NO.:  [id number]          PATIENT TYPE:  IPS   LOCATION:  0601                          FACILITY:  [facility]   PHYSICIAN:  Geoffery Lyons, M.D.      DATE OF BIRTH:  02/22/1981   DATE OF ADMISSION:  04/11/2008  DATE OF DISCHARGE:  04/18/2008                               DISCHARGE SUMMARY   CHIEF COMPLAINT:  This was one of several admissions to [facility] for this 30 year old widowed white male.  Reported to  the emergency room stating that he had homicidal thoughts.  He claimed a  man had framed him for stealing a TV. He was now facing charges, he  could even serve prison time. He has had fight with this person the last  2 days having homicidal thoughts towards him. Has been noncompliant with  his medication of them for the last 3 weeks. He has a court date July 21  regarding this.   PAST PSYCHIATRIC HISTORY:  Last admission March 11 to March 18 to April  3 to April 8th and April 9 to April 13.  He still endorsed that he was  dealing with the fact that his wife from he from whom he was separated  committed suicide.  Has children ages 61, 62 and 6 living with the with  his widow wife's grandparents. When he was discharged in April, he was  on Abilify 10 mg twice a day, Protonix 40 mg per day, trazodone 100 mg  at night.   PHYSICAL EXAM:  Failed to show any acute findings.   LABORATORY WORK:  UDS positive for marijuana.  Exam reveals alert,  cooperative male appropriately groomed and dressed. His mood was with  mixed. Affect was bright, cheerful. Speech was normal in rate, tempo and  production. No active delusions.  No homicidal or suicidal ideas, no  hallucinations.  Cognition well-preserved.  As added information, he  apparently conveyed that he did not want to go to prison and basically  that was the reason why he was in the hospital as per the PA who are  admitted him.   ADMITTING DIAGNOSES:   AXIS I: Mood disorder NOS; marijuana, cocaine,  alcohol and opiate abuse.  AXIS II: No diagnosis.  AXIS III:  Acid reflux.  AXIS IV: Moderate.  AXIS V:  Upon admission 35-40, highest GAF in the last year 60.   COURSE IN THE HOSPITAL:  Was admitted.  He was started in the individual  and group psychotherapy. As already stated, a 30 year old male in one of  multiple admissions.  Claimed he had conflict with the neighbor who  accused him of stealing plasma TV.  The police were called and he spent  7 days in jail. He claimed that this person recognized that it was a  mistake and apologized, but he still endorsing homicidal ideas toward  this person.  Claimed that due to all the stress he resumed his  drinking.  He was wanting to go to past weekend to [place].  We worked  on getting the Risperdal back in place. Risperdal was increased to 2 mg.  June 14, was endorsing that he was better on the Risperdal and wanted to  pursue this medication further and was going to go to [facility]. We  worked relapse prevention.  June 15, found out that he was not going to  be able to go to [facility].  Will have to find some other options. They  were looking into [facility] are [facility] in [place].  We pursued further  work and further stabilization and coping skills.  June 16, was going to  have an assessment for possible admission to [facility].  We continued  to work to stabilize work on Pharmacologist, relapse prevention. By June  18, was in full contact with reality.  He was also going to be followed  by the [facility] program.   DISCHARGE DIAGNOSES:  AXIS I: Mood disorder NOS, alcohol and marijuana,  cocaine, opiate dependence in different stages of remission.  AXIS II:  No diagnoses.  AXIS III:  Gastroesophageal reflux disease.  AXIS IV: Moderate.  AXIS V:  Upon discharge 50.   Discharged on Risperdal M tab 0.5 twice a day and 1 mg at night,  trazodone 100 mg at night and Protonix 40 mg per day.   Followup [facility] and then [facility].      Geoffery Lyons, M.D.  Electronically Signed     IL/MEDQ  D:  05/21/2008  T:  05/21/2008  Job:  [id number]

## 2011-03-19 NOTE — Discharge Summary (Signed)
NAME:  NEHAL, SHIVES NO.:  [id number]   MEDICAL RECORD NO.:  [id number]                   PATIENT TYPE:  IPS   LOCATION:  0501                                 FACILITY:  BH   PHYSICIAN:  Carolanne Grumbling, M.D.                 DATE OF BIRTH:  04/10/1981   DATE OF ADMISSION:  10/22/2003  DATE OF DISCHARGE:  10/25/2003                                 DISCHARGE SUMMARY   INITIAL ASSESSMENT AND DIAGNOSIS:  Mr. Rahming was a 30 year old male.  Mr.  Petrey was admitted to the hospital after reportedly hearing auditory  hallucinations telling him to kill the doctor who had refused his brother  treatment for alcoholism.  He reportedly had a history of schizophrenia and  was living in a group home and was subsequently admitted.   MENTAL STATUS AT THE TIME OF THE INITIAL EVALUATION:  Revealed an alert,  oriented young man who was cooperative.  He was appropriately dressed and  groomed.  Speech was clear, affect was flat.  He had admitted to auditory  hallucinations earlier but denied any other hallucinations or delusions.  He  was positive for wanting to kill the doctor of his brother who lived in  [place].  Judgment was limited, insight was lacking, concentration was  adequate.   ADMITTING DIAGNOSIS:   AXIS I:  1. Paranoid schizophrenia.  2. History of post-traumatic stress disorder.  3. Cannabis abuse.   AXIS II:  Borderline personality disorder by history.   AXIS III:  Healthy.   AXIS IV:  Severe.   AXIS V:  25.   FINDINGS:  All indicated laboratory examinations were within normal limits  except for a slightly elevated serum glucose which was 113 and slightly  elevated liver ALT which was 44, with a normal expected high of 40.   HOSPITAL COURSE:  While in the hospital, Mr. Zenon basically pulled himself  together very quickly.  From the time of admission he denied that he would  really kill the doctor in [place].  He said he understood that his brother,  who was actually a foster brother, was responsive for his death because his  brother had been drinking in an accident where he was driving the car.  He  said even when he was having these homicidal thoughts that he would not  actually kill the doctor in [place].  He by the time of discharge was denying  any auditory hallucinations.  He wanted to go back to his group home for  Christmas, and consequently he was discharged.   FINAL DIAGNOSES:   AXIS I:  1. Psychotic disorder not otherwise specified.  2. Reported history of schizophrenia.  3. Post-traumatic stress disorder by history.   AXIS II:  Borderline personality disorder reportedly by history.   AXIS III:  Overweight.   AXIS IV:  Severe.   AXIS V:  50.  MEDICATIONS AT THE TIME OF DISCHARGE:  Risperdal 3 mg at bedtime, Topamax 25  mg at bedtime, Lexapro 20 mg daily, Remeron 15 mg at bedtime, Protonix 40 mg  daily, and was requesting a nicotine patch.   POST HOSPITAL CARE PLANS:  He was referred back to live at his group home.  He was to follow up with Dr. Barnett Abu for his outpatient treatment.   DIET AND ACTIVITY:  There were no restrictions placed on his diet or his  activity.                                               Carolanne Grumbling, M.D.    GT/MEDQ  D:  11/07/2003  T:  11/07/2003  Job:  [id number]

## 2011-03-19 NOTE — Discharge Summary (Signed)
[name], WRAY NO.:  [id number]   MEDICAL RECORD NO.:  [id number]          PATIENT TYPE:  IPS   LOCATION:  0508                          FACILITY:  BH   PHYSICIAN:  Geoffery Lyons, M.D.      DATE OF BIRTH:  November 30, 1980   DATE OF ADMISSION:  10/16/2008  DATE OF DISCHARGE:  10/18/2008                               DISCHARGE SUMMARY   CHIEF COMPLAINT/HISTORY OF PRESENT ILLNESS:  This was one of multiple  admissions to [facility] for this 30 year old single  male known to over system.  He reports he had been suicidal for 2 days,  can't take it anymore depressed, learning that his grandmother in  [place], [place] died at age 63 on 03-30-23 10-24-2023.  He has been  using cocaine and drinking since then.   PAST PSYCHIATRIC HISTORY:  Multiple admissions to [facility] followed by [facility].   ALCOHOL AND DRUG HISTORY:  Persistent use of substances.  Alcohol,  marijuana, cocaine.   MEDICAL HISTORY:  Noncontributory.   MEDICATIONS:  Off meds for 2-3 weeks.  Used to take Abilify.   PHYSICAL EXAMINATION:  Failed to show any acute findings.   LABORATORY WORK:  Laboratory workup not available in the chart.   MENTAL STATUS EXAMINATION:  Exam reveals an alert, cooperative male.  Mood depressed.  Affect depressed.  Thought processes logical, coherent  and relevant.  Endorsed feeling overwhelmed.  Endorsed no active  suicidal or homicidal ideas upon this assessment.  No hallucinations.  No delusions.  Cognition well preserved.   ADMISSION DIAGNOSES:  AXIS I:  Polysubstance dependence.  Depressive  disorder not otherwise specified.  AXIS II:  No diagnosis.  AXIS III:  No diagnosis.  AXIS IV:  Moderate.  AXIS V:  GAF on admission 35-40, highest GAF in the last year 60.   COURSE IN THE HOSPITAL:  He was admitted and started on individual and  group psychotherapy.  He was able to start opening up.  Endorsed  depression, family  problems.  He lost his grandmother, claims she was  his last surviving family member.  His ex would not allow him to see his  children for Christmas.  Since then, he has been using alcohol,  marijuana and cocaine heavily.  Endorsing decreased sleep, nightmares,  crazy dreams.  He was placed on Abilify and given trazodone for sleep.  By December 18th, he endorsed he was feeling better.  Endorsed no active  suicidal or homicidal ideas, no hallucinations or delusions.  He was  wanting to go to [facility].  He was going to be at his placement but the  outpatient caseworker with [facility] was going to be checking with [facility]  to see when he could be admitted.  He felt safe.  He wanted to be out of  the hospital.  We went ahead and discharged him to outpatient follow-up.   DISCHARGE DIAGNOSES:  AXIS I:  Mood disorder NOS.  Psychotic disorder  NOS.  Polysubstance dependence, alcohol, cocaine, marijuana.  AXIS II:  No diagnosis.  AXIS III:  No diagnosis.  AXIS IV:  Moderate.  AXIS V:  GAF on discharge 50-55.   DISCHARGE MEDICATIONS:  1. Discharged on Abilify 15 mg twice a day.  2. Pepcid 20 mg per day.  3. Trazodone 100 mg at night.   Follow up at [facility] on [address] in [place].      Geoffery Lyons, M.D.  Electronically Signed     IL/MEDQ  D:  11/19/2008  T:  11/19/2008  Job:  [id number]

## 2011-03-19 NOTE — Discharge Summary (Signed)
NAMETAVONE, CAESAR NO.:  [id number]   MEDICAL RECORD NO.:  [id number]          PATIENT TYPE:  IPS   LOCATION:  0502                          FACILITY:  BH   PHYSICIAN:  Geoffery Lyons, M.D.      DATE OF BIRTH:  1981-03-27   DATE OF ADMISSION:  07/19/2008  DATE OF DISCHARGE:  07/25/2008                               DISCHARGE SUMMARY   CHIEF COMPLAINT/PRESENTING ILLNESS:  This was 1 of multiple recurrent  admissions to [facility] for this 30 year old male  voluntarily admitted September 18.  Claimed depression, homicidal ideas,  told ex-girlfriend's boyfriend.  Boyfriend leaving threatening messages  towards him.  He has no history of violence.  Relapse on cocaine and  alcohol.  In 2009 he had been admitted from March 11-March 18, April 3-  April 8, April 9-April 13, June 11-June 18, July 13-July 16 and now  September 19.   PAST SURGICAL HISTORY:  As already stated multiple admissions for  different reasons followed by [facility] community support.   ALCOHOL AND DRUG HISTORY:  As already stated, persistent use of alcohol  and cocaine.   MEDICAL HISTORY:  Noncontributory.   MEDICATIONS:  Was on trazodone on Abilify, although he has been on  Risperdal before.   Physical exam failed to show any acute findings.   LABORATORY WORKUP:  Not available in the chart.   PHYSICAL EXAMINATION:  Reveals an alert, cooperative male, soft spoken,  offers little mood depressed, affect depressed, endorsing different  reasons why he had to be admitted, rationalizes, projects, not too  forthcoming, not able to see his role in everything that is going on in  his life, no active suicidal, homicidal  ideas, no delusions.  No  hallucinations.  Cognition well-preserved.   ADMITTING DIAGNOSES:  Axis I:  Mood disorder not otherwise specified,  alcohol and cocaine abuse.  Axis II:  No diagnosis.  Axis III:  No diagnosis.  Axis IV: Moderate.  Axis V:  Upon  admission 35-40, in the last year 60.   COURSE IN THE HOSPITAL:  He was admitted.  He was started individual and  group psychotherapy.  He was placed back on the Risperdal and the  trazodone.  As already stated he was staying in a boarding house, claim  homicidal thoughts towards the ex-girlfriend's boyfriend who called  threatening him, using marijuana, cocaine, alcohol; alcohol two or three  40 ounces, marijuana every day, cocaine every other day, crack for the  last 2-1/2 weeks.  September 20 trying to see where he was going to go  from here, trying to get into a structured place.  Claimed that he  understood he could not be going back and forth as he has been lately.  Claimed that he did get easily agitated.  Claimed voices, so we pursue  the Risperdal.  Was very vague, wanting to go to residential treatment  center.  Claimed he were not able to make it with broad different  placement options.  He continued to have no clear idea.  We continued to  try to work on Pharmacologist, work some relapse prevention.  We  encouraged to focus on himself.  September 23 he became more honest,  endorsed that the baseline was the landlord wanted him out because he  was allowing people to use drugs in his place.  Wanted Child psychotherapist,  called the landlord and tried to ask the landlord to allow him to get  back.  We were encouraging him to go to rebound.  September 24 endorsed  he wanted to go to the rescue mission in [place].  Apparently his  apartment manager's brother is connected with the mission and will be  working with him and get him established in [place].  So, we went ahead  and facilitated his getting discharged to [facility] with help of his  community support group.  Upon discharge in full contact with reality.  No suicidal, homicidal ideations.  Claimed that he was committed to make  this work.   DISCHARGE DIAGNOSES:  Axis I:  Alcohol, cocaine, marijuana abuse.  Psychotic not otherwise  specified.  Mood disorder not otherwise  specified.  Axis II:  No diagnosis.  Axis III:  No diagnosis.  Axis IV:  Moderate.  Axis V:  Upon discharge 55-60.   Discharged on:  1. Risperdal 0.51 twice a day, 2 at night.  2. Trazodone 100 at bedtime.  3. Protonix 40 mg per day.   FOLLOWUP:  [facility] community support.      Geoffery Lyons, M.D.  Electronically Signed     IL/MEDQ  D:  08/07/2008  T:  08/08/2008  Job:  [id number]

## 2011-03-19 NOTE — H&P (Signed)
[facility]  Patient:    [name], [name] Visit Number: [id number] MRN: [id number]          Service Type: PSY Location: 500 [id number] Attending Physician:  Rachael Fee Dictated by:   Candi Leash. Orsini, N.P. Admit Date:  04/17/2002                     Psychiatric Admission Assessment  IDENTIFYING INFORMATION:  A 30 year old single white male who was voluntarily committed for depression and suicidal ideation and psychosis on April 17, 2002.  HISTORY OF PRESENT ILLNESS:  The patient presents with a history of depression, suicidal thoughts to cut his wrists.  Reports positive auditory hallucinations for years that have been increasing in intensity for 1 week. Reports significant stressors.  His mother and his daughter recently moved to [place].  He states he feels very depressed, has difficulty going to sleep.  His appetite has been satisfactory.  He reports no weight loss.  Denies any current suicidal or homicidal ideations or psychosis.  He reports he has been compliant with his medication, but he states he does not want to return to his current group home.  PAST PSYCHIATRIC HISTORY:  Second hospitalization at [facility], was in [place] in May of 2003 for alcohol and marijuana use.  He has a history of hanging himself, cutting himself, and jumping off a bridge, and jumping in front of a car.  SOCIAL HISTORY:  A 30 year old single white male with 1 daughter who is age 69. He is at [facility] group home in [place], has been there for the past 3-4 months.  He has completed high school.  He has no legal problems.  He wants to return to [facility] group home.  FAMILY HISTORY:  None.  ALCOHOL DRUG HISTORY:  The patient smokes.  He has been sober for the past 5-6 weeks.  PAST MEDICAL HISTORY:  Primary care Keri Veale is none.  Medical problems are none.  MEDICATIONS:  Protonix 40 mg q.a.m., Risperdal 1.5 mg q.h.s., Lexapro 20 mg every day for the  past 2 weeks, Neurontin 300 mg at h.s.  DRUG ALLERGIES:  No known allergies.  PHYSICAL EXAMINATION:  The patients vital signs:  97.4, 95 heart rate, 20 respirations, blood pressure is 137/78.  The patient is 5 feet 9 inches tall and weighs 232 pounds.  GENERAL APPEARANCE:    Patient is a 30 year old Caucasian male in no acute distress.  He is well developed, appears his stated age.  He is fairly groomed, alert and cooperative.  HEAD:  Normocephalic, he can raise his eyebrows.  His hair is short, clean and of equal distribution.  EYES:  His EOMs are intact bilaterally.  His external ear canals are patent.  His hearing is appropriate to conversation.  No sinus tenderness or nasal discharge.  Mucosa is moist with fair dentition. Tongue protrudes midline without tremor.  He can clench his teeth and puff out his cheeks.  NECK:  Supple, no JVD, negative lymphadenopathy.  Thyroid is nonpalpable and nontender.  CHEST:  Clear to auscultation.  No adventitious sounds.  HEART:  Regular rate and rhythm, without murmurs, gallops or rubs. GENITALIA EXAM is deferred.  ABDOMEN:  Soft, mildly obese abdomen.  No masses or organomegaly.  No CVA tenderness.  MUSCULOSKELETAL:  Spine is straight.  Muscle strength and tone is equal bilaterally.  No signs of injuries.  SKIN:  Warm and dry with good turgor.  Nail beds are pink  with good capillary refill.  Strong bilateral radial pulses.  NEUROLOGIC:  Cranial nerves are grossly intact.  Good grip strength bilaterally.  No involuntary movements.  Cerebellar functions were intact, with heel-to-shin, normal alternating movements.  LABORATORY DATA:  UA is clear.  CBC within normal limits, CMET:  blood sugar is elevated at mildly at 117.  SGPT 62.  Urine drug screen was negative.  TSH is 2.472.  MENTAL STATUS EXAMINATION:  He is an alert, young adult, tall, well-developed male, calm, good eye contact.  Speech is clear, affect is flat.   Thought processes:  No current psychosis, no  auditory or visual hallucinations, no suicidal or homicidal ideations.  Cognitive function intact.  Memory is fair, judgment is fair, insight is fair.  ADMISSION DIAGNOSES: Axis I:    1. Schizophrenia, paranoid type,            2. Rule out schizoaffective disorder.            3. Rule out depression not otherwise specified.            4. 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 Axis II:   Deferred. Axis III:  None. Axis IV:   Problems with primary support group, housing and other psychosocial            problems. Axis V:    Current 35, estimated this past year 25.  INITIAL PLAN OF CARE:  Voluntary admission commitment for depression and psychosis and suicidal ideation.  Contract for safety, check every 15 minutes. Will resume his routine medications, will check labs, have trazodone for sleep.  The patient to attend groups.  Case worker to look at patient returning to [facility] group home.  Follow up with mental health, remain alcohol and drug free.  TENTATIVE LENGTH OF STAY:  3-5 days. Dictated by:   Candi Leash. Orsini, N.P. Attending Physician:  Rachael Fee DD:  04/19/02 TD:  04/19/02 Job: [id number]

## 2011-03-19 NOTE — Discharge Summary (Signed)
[facility]  Patient:    [name], [name] Visit Number: [id number] MRN: [id number]          Service Type: EMS Location: [facility] Attending Physician:  Carmelina Peal Dictated by:   Reymundo Poll Dub Mikes, M.D. Admit Date:  02/07/2002 Discharge Date: 02/07/2002                             Discharge Summary  CHIEF COMPLAINT AND HISTORY OF PRESENT ILLNESS:  This was the first admission to [facility] for this 30 year old male involuntarily admitted due to suicidal ideas and psychosis.  History of depression for one week, very tired and weak, suicidal thoughts, no specific plan.  History of positive auditory hallucinations for years but voices were intensified telling him to kill himself.  Experiencing positive visual hallucinations and paranoid ideas, feeling that they were after him; positive tactile hallucinations feeling that had been crawling on him.  Reported he had been drinking and smoking marijuana to rid himself of these hallucinations.  Reported that his sleep and appetite have been satisfactory.  Denied any specific stressors. Had been feeling shaky this morning from alcohol withdrawal.  PAST PSYCHIATRIC HISTORY:  First time at [facility]. Had been in [place] two months ago for depression and suicidal thoughts. History of paranoid schizophrenia.  No history of previous detoxification.  SUBSTANCE ABUSE HISTORY:  Drinking a pint of liquor every other day for a year, drinks with his friends.  He had no history of blackouts or seizures. Last drink was on Friday, drinking one pint.  Smoked marijuana every day.  PAST MEDICAL HISTORY:  Noncontributory.  MEDICATIONS ON ADMISSION: 1. ______ . 2. Prevacid. 3. Abilify for one week. 4. Lexapro 10 mg daily.  PHYSICAL EXAMINATION:  GENERAL:  Performed and failed to show any acute findings.  MENTAL STATUS EXAMINATION ON ADMISSION:  Alert, young male, cooperative,  fair eye contact, casually dressed.  Speech was normal and relevant.  Mood was depressed.  Affect was blunt and void of emotions.  Thought processes: Coherent; no current psychosis, no auditory or visual hallucinations, no suicidal or homicidal ideation.  Cognitive: Well preserved.  ADMITTING DIAGNOSES: Axis I:    1. Schizophrenia, paranoid type.            2. Depressive disorder, not otherwise specified. Axis II:   No diagnosis. Axis III:  No diagnosis. Axis IV:   Moderate. Axis V:    Global assessment of functioning upon admission 25-30, highest          global assessment of functioning in the last year 60.  HOSPITAL COURSE:  He was admitted and started in intensive individual and group psychotherapy.  He was kept on Prevacid, Risperdal, and Lexapro.  Was detoxified using phenobarbital.  Lexapro was increased to 50 mg daily.  Once he was admitted and started back on medications he started feeling better, started looking at his life, growing up in foster homes, living basically isolated, withdrawn.  On March 25 he was feeling much better, was sleeping through the night.  Continued to work on self.  On March 26 he was in full contact with reality, mood euthymic, affect bright and broad, no suicidal ideas, no homicidal ideas, no active psychosis, willing and motivated to pursue further outpatient treatment.  DISCHARGE DIAGNOSES: Axis I:    1. Schizophrenia, paranoid type.            2.  Depressive disorder, not otherwise specified.            3. Alcohol and marijuana abuse. Axis II:   No diagnosis. Axis III:  No diagnosis. Axis IV:   Moderate. Axis V:    Global assessment of functioning upon discharge 55.  DISCHARGE MEDICATIONS: 1. Protonix 40 mg daily. 2. Risperdal 0.25 mg one in the morning and two at night. 3. Lexapro 50 mg daily.  FOLLOWUP:  [facility]. Dictated by:   Reymundo Poll Dub Mikes, M.D. Attending Physician:  Carmelina Peal DD:  02/28/02 TD:   02/28/02 Job: [id number]

## 2011-03-19 NOTE — H&P (Signed)
NAME:  [name], [name]                            ACCOUNT NO.:  [id number]   MEDICAL RECORD NO.:  [id number]                   PATIENT TYPE:  IPS   LOCATION:  0504                                 FACILITY:  [facility]   PHYSICIAN:  Geoffery Lyons, M.D.                   DATE OF BIRTH:  [birth year]   DATE OF ADMISSION:  08/16/2003  DATE OF DISCHARGE:                         PSYCHIATRIC ADMISSION ASSESSMENT   ATTENDING PHYSICIANS:  1. Geoffery Lyons, M.D.  2. Jeanice Lim, M.D.   HISTORY OF PRESENT ILLNESS:  This is a 30 year old white single male.  His  commitment papers indicate that he is known to have schizophrenia and  cannabis dependence as well as cocaine abuse and alcohol abuse, in remission  for 83 days.  He is known to have a diagnosis of PTSD as well as borderline  personality disorder.  He is now complaining of increase in auditory  hallucinations commanding him to kill, kill, kill and he feels that he may  act on the commands.  He is staying at the [facility] and does want to relapse on  drugs.   PAST PSYCHIATRIC HISTORY:  The patient states that he has had several  admissions previously to [facility], [facility] and [facility].  He is currently followed at [facility], where he is receiving Remeron 15 mg at night (h.s.), Risperdal 2 mg  at h.s. and Lexapro 20 mg p.o. daily.  He had recently been on Seroquel, but  that was discontinued because it was making him too sleepy during the  daytime.   SOCIAL HISTORY:  He finished high school.  He has been living at this rehab  center for one and a half months.  He has been clean and sober 83 days and  this was from cocaine, alcohol and marijuana.  He does continue to smoke; he  smokes 1 to 1-1/2 packs per day and he has for 10 years.   FAMILY HISTORY:  He states his mother has had depression.   PRIMARY CARE Armstead Heiland:  He has no medical physician, he just has his  psychiatric, Dr.  Sherin Quarry.   MEDICATIONS:  Medications have already been listed.   ALLERGIES:  He has no known drug allergies.   PHYSICAL EXAMINATION:  GENERAL:  This is an obese white male in no acute  distress.  HEENT:  Exam was within normal limits.  CHEST:  Chest is clear to auscultation and percussion.  HEART:  His heart revealed a regular rate and rhythm without murmurs, rubs,  or gallops.  ABDOMEN:  His abdomen is obese with no palpable hepatosplenomegaly, masses  or tenderness.  MUSCULOSKELETAL:  His musculoskeletal system revealed no clubbing, cyanosis,  or edema.  He does have some decrease in range of motion secondary to  obesity.  NEUROLOGIC:  Cranial nerves II-XII are grossly intact.   MENTAL  STATUS EXAM:  He is alert and oriented x3.  He is obese.  He is neat.  He is cooperative.  His speech has a normal rate, rhythm and tone, no  psychotic content.  His mood is currently within normal limits.  His thought  processes are clear and rational.  He currently denies suicidal, homicidal  or auditory hallucinations.  Concentration and memory are intact.  Judgment  and insight are intact.  His intelligence is average.   ASSESSMENT:   AXES I:  The patient presented with diagnoses of:  1. Paranoid schizophrenia.  2. Posttraumatic stress disorder.  3. Substance abuse, in full sustained remission of cocaine, alcohol and     marijuana for 83 days.   AXIS II:  Borderline personality disorder.   AXIS III:  Obesity.   AXIS IV:  Severe -- occupational problem causing increase in stress and  making him become symptomatic psychotically.   AXIS V:  Twenty-five.   PLAN:  The plan is to optimize his medications to eliminate auditory  hallucinations, to add Topamax to help with nightmares and intrusions for  his PTSD and to encourage him to change his job.  A week and a half ago, he  became a door-to-door vacuum salesman and apparently this is just too  stressful.   TENTATIVE LENGTH OF STAY  AND DISCHARGE PLAN:  Three to five days.     Mickie Leonarda Salon, P.A.-C.               Geoffery Lyons, M.D.    MD/MEDQ  D:  08/17/2003  T:  08/19/2003  Job:  [id number]

## 2011-03-19 NOTE — Discharge Summary (Signed)
NAME:  [name], ROMANOSKI NO.:  [id number]   MEDICAL RECORD NO.:  [id number]                   PATIENT TYPE:  PS   LOCATION:  0507                                 FACILITY:  [facility]   PHYSICIAN:  Haskel Khan, M.D.           DATE OF BIRTH:  10-Sep-1981   DATE OF ADMISSION:  04/17/2002  DATE OF DISCHARGE:  04/26/2002                                 DISCHARGE SUMMARY   IDENTIFYING DATA:  This is a 30 year old single Caucasian male voluntarily  admitted for depression and suicidal ideation and psychosis, reporting  auditory hallucinations worsening over the last week.   MEDICATIONS:  Protonix, Risperdal, Lexapro and Neurontin.   ALLERGIES:  No known drug allergies.   PHYSICAL EXAMINATION:  Essentially within normal limits.  Neurologically  nonfocal.   LABORATORY DATA:  Urinalysis negative.  CBC within normal limits.  CMET  within normal limits.  Glucose mildly elevated at 117 and SGPT at 52.  Urine  drug screen negative.  TSH was 2.47.   MENTAL STATUS EXAM:  Alert, young, tall, well-developed male, calm with good  eye contact.  Speech clear.  Mood depressed.  Affect flat.  Thought  processes goal directed.  Thought content negative for psychotic symptoms.  He denied current auditory hallucinations.  Cognitive function was intact.  Judgment and insight fair.   ADMISSION DIAGNOSES:   AXIS I:  1. Schizophrenia, paranoid-type.  2. Alcohol and marijuana abuse, in partial remission.   AXIS II:  None.   AXIS III:  None.   AXIS IV:  Moderate (problems with primary support group and housing).   AXIS V:  35/55.   HOSPITAL COURSE:  The patient was admitted and ordered routine p.r.n.  medications.  The patient was restarted on Risperdal, Neurontin and Lexapro  as well as Protonix and trazodone.  Risperdal and Neurontin were optimized  and case management worked on psychosocial stressors including patient  returning to group home.  Risperdal and  Neurontin were further optimized  targeting psychotic symptoms.  The patient had a TB skin test and discharge  planning was completed.  The patient participated fully in individual, group  and milieu therapy, reporting a positive response to medication changes and  clinical intervention without side effects.   CONDITION ON DISCHARGE:  Improved.  Mood was more euthymic.  Affect  brighter.  Thought processes goal directed.  Thought content negative for  psychotic symptoms.  There is no suicidal or homicidal ideation.  The  patient reported motivation to be compliant with follow-up plan.   DISCHARGE MEDICATIONS:  1. Lexapro 10 mg, 2 q.a.m.  2. Protonix 40 mg q.a.m.  3. Risperdal 3 mg q.h.s.  4. Neurontin 300 mg, 2 q.h.s.  5. Trazodone 100 mg q.h.s.   FOLLOW UP:  The patient was to [facility] at [facility] on Wednesday, May 02, 2002 at 3:30 p.m.  DISCHARGE DIAGNOSES:   AXIS I:  1. Schizophrenia, paranoid-type.  2. Alcohol and marijuana abuse, in partial remission.   AXIS II:  None.   AXIS III:  None.   AXIS IV:  Moderate (problems with primary support group and housing).   AXIS V:  Global Assessment of Functioning on discharge 55.                                                Haskel Khan, M.D.    JEM/MEDQ  D:  06/06/2002  T:  06/06/2002  Job:  [id number]

## 2011-03-19 NOTE — Discharge Summary (Signed)
[name], [name] NO.:  [id number]   MEDICAL RECORD NO.:  [id number]          PATIENT TYPE:  IPS   LOCATION:  0302                          FACILITY:  [facility]   PHYSICIAN:  Jeanice Lim, M.D. DATE OF BIRTH:  02-26-1981   DATE OF ADMISSION:  01/05/2005  DATE OF DISCHARGE:  01/08/2005                                 DISCHARGE SUMMARY   IDENTIFYING DATA:  This is a 30 year old single Caucasian male voluntarily  admitted, brought by pastor.  He had been threatened with eviction by  landlord due to reported drug use.  Patient reported that he had relapsed on  alcohol and for 2-3 months had been drinking, ever since he was put on  Ativan for panic attacks, which he said was very helpful for the panic  attacks.  Medication were monitored through ACT team so he is unable to  abuse the Ativan, but apparently was drinking on top of his reported  auditory hallucinations 3 days ago.  He had a history of hanging himself,  cutting himself and tried to jump off a bridge reporting strong suicidal  urges before coming to the hospital.  ACT team was contacted regarding  admission, and patient's symptoms.  Patient was emphatic that he go directly  to a residential program despite PACT team providing comprehensive services  and able to assist him with his living situation.  He felt the substance  abuse issue was out of control.   MEDICATIONS:  Lexapro.  Protonix.  Topamax.  Risperdal.  Ativan 1 mg b.i.d.  Ambien.  Remeron.   ALLERGIES:  No known drug allergies.   PHYSICAL EXAMINATION:  Physical and neurologic exam within normal limits.   ROUTINE ADMISSION LABS:  Within normal limits.   MENTAL STATUS EXAM:  Alert, cooperative with psychomotor slowing.  Speech  slow, decreased amount.  Mood depressed, feeling guilty, lack of volition.  Thought process goal directed.  Describing a clear history of auditory  hallucinations occurring a day prior to admission.  Mildly paranoid,  but  this was improving as well.  Patient appeared to be highly motivated to work  on his substance abuse history and did not feel like he could maintain  sobriety nor safety outside of the hospital if he was not in a program.   ADMISSION DIAGNOSES:   AXIS I:  1.  Alcohol dependence.  2.  Cocaine abuse.  3.  Schizophrenia, paranoid type.  4.  Possible substance induced mood and psychotic disorder.   AXIS II:  Deferred.   AXIS III:  None.   AXIS IV:  Severe stressors with living situation and sequelae of substance  use and limited support system other than comprehensive intensive ACT team  followup.   AXIS V:  35/55.   HOSPITAL COURSE:  Patient was admitted, ordered routine p.r.n. medications,  underwent further monitoring, was encouraged to participate in individual,  group and milieu therapy.  He was placed on Librium detox protocol and  continued on Risperdal and Symmetrel.  ACT team psychiatrist was contacted,  Elna Breslow, MD, and this clinician had  no concerns regarding continued use  of Ativan since the Ativan was being monitored, although may not have been  aware of the extent of the patient's alcohol use and was under the  impression that there had been a recent relapse of cocaine, but the patient  apparently had not been informing the ACT team of the extent of his alcohol.  Abuse.  Treatment plan initially was to attempt to get the patient  stabilized and in the outpatient setting due to the intensive services  provided, he would be able to be maintained safely in the outpatient  setting.  However, the patient was emphatic about going to a residential  program and was not willing to be discharged, also reported being unsafe to  be discharged due to feeling out of control with the substances.  Therefore,  despite attempts to encourage the patient continue with getting back into  the intensive monitoring which was quite an asset for him, he refused this  plan.   Therefore, assistance was given for patient to be placed in a  residential program, short term, and then, hopefully, return to ACT team  services if still available.  Patient did report improvement in psychotic  symptoms and mood, and appeared highly motivated regarding the substance use  issues to remain abstinent and requested to be actually detoxed off of the  Ativan since he felt this although very helpful for panic did not make it  possible for him to control the alcohol.  Therefore, patient was quite  forthcoming and showed good insight regarding this, which is another asset.  The patient may be ready to benefit from substance abuse treatment.  Patient  was discharged in improved condition to go directly to [facility] in  [place].  They would pick him up at the [facility], and patient  would be driven directly to the [facility], given medication  education and discharged.   DISCHARGE MEDICATIONS:  1.  Symmetrel 100 mg b.i.d.  2.  Risperdal 2 mg 2 q.h.s.  3.  Topamax 25 mg b.i.d.  4.  Lexapro 10 mg q.a.m.  5.  Ativan 1 mg b.i.d. prior to admission which had been stopped at the      request of the patient.  6.  Protonix 40 mg daily.   Patient was to avoid alcohol and drug use.  The possibility of benzos in the  future will be up to the substance abuse treatment center and the outpatient  psychiatrist, and may be reasonable if ACT services can provide random  screens for substance abuse and be aware of the increased risk of relapsing  with alcohol if on a benzodiazepine.  Patient was discharged in improved  condition.   DISCHARGE DIAGNOSES:   AXIS I:  1.  Alcohol dependence.  2.  Cocaine dependence.  3.  Schizophrenia, paranoid type, acute exacerbation, mild.   AXIS II:  Deferred.   AXIS III:  None.   AXIS IV:  Severe stressors with living situation and sequelae of substance use and limited support system other than comprehensive intensive ACT  team  followup.   AXIS V:  Global assessment of function was 55 on discharge.      JEM/MEDQ  D:  02/13/2005  T:  02/14/2005  Job:  [id number]

## 2011-03-19 NOTE — Discharge Summary (Signed)
NAME:  [name], [name] NO.:  [id number]   MEDICAL RECORD NO.:  [id number]                   PATIENT TYPE:  IPS   LOCATION:  0504                                 FACILITY:  [facility]   PHYSICIAN:  Geoffery Lyons, M.D.                   DATE OF BIRTH:  01-Sep-1981   DATE OF ADMISSION:  08/16/2003  DATE OF DISCHARGE:  08/19/2003                                 DISCHARGE SUMMARY   CHIEF COMPLAINT AND PRESENT ILLNESS:  This was one of several admissions to  [facility] and other institutions for this 30-year-  old single male, committed.  Papers indicated that he was complaining of  increased auditory hallucinations commanding him to kill, kill, kill.  He  was afraid he would act on the commands.  He was staying at the [facility] and did  not want to relapse on drugs.   PAST PSYCHIATRIC HISTORY:  Several admissions to [facility], [place], [facility], and [place].  He was receiving Remeron 15 mg at night,  Risperdal 2 mg at night, Lexapro 20 mg daily.  He had been on Seroquel but  had discontinued, making him too sleepy.   SUBSTANCE ABUSE HISTORY:  As already stated.  History of cocaine, alcohol,  and marijuana but denied any active use.   PAST MEDICAL HISTORY:  Noncontributory.   MEDICATIONS:  1. Risperdal 3 mg at night.  2. Remeron 15 mg at night.  3. Lexapro 20 mg per day.  4. Ambien 10 mg at bedtime for sleep.   PHYSICAL EXAMINATION:  Physical examination was performed, failed to show  any acute findings.   MENTAL STATUS EXAM:  Mental status exam revealed an alert, oriented male,  overweight, cooperative.  Speech was normal rate, rhythm, and tone; no  psychotic content.  Mood: Anxious.  Thought processes were clear and  rational; denied any suicidal or homicidal ideas.  Cognitive: Cognition was  well preserved.   ADMISSION DIAGNOSES:   AXIS I:  1. Psychotic disorder, not otherwise specified.  2. Posttraumatic stress  disorder by history.   AXIS II:  Deferred.   AXIS III:  Obesity.   AXIS IV:  Moderate.   AXIS V:  Global assessment of functioning upon admission 25-30, highest  global assessment of functioning in the last year 65.   LABORATORY DATA:  CBC was within normal limits.  Blood chemistries were  within normal limits.  SGOT 30, SGPT 64.  Thyroid profile was within normal  limits.   HOSPITAL COURSE:  He was admitted and started in intensive individual and  group psychotherapy.  He was maintained on his medications and he was given  Topamax at bedtime.  He was still feeling depressed, living in the  residential treatment center, stress from a new job, selling vacuum cleaners  door-to-door.  He became depressed and suicidal.  As he presented to the  emergency room and was admitted, he was feeling much better.  On October 18,  he was in full contact with reality, no hallucinations, no delusions, no  suicidal ideas, no homicidal ideas.  He stated that he was back to feeling  himself, stated that he just needed a medication adjustment, was feeling  much better.  He was going to get a new job as he did not feel he could back  to selling vacuum cleaners because it was too stressful.  We went ahead and  discharged back to [facility].   DISCHARGE DIAGNOSES:   AXIS I:  1. Psychotic disorder, not otherwise specified.  2. Posttraumatic stress disorder, by history.  3. Polysubstance abuse in sustained remission.   AXIS II:  No diagnosis.   AXIS III:  Obesity.   AXIS IV:  Moderate.   AXIS V:  Global assessment of functioning upon discharge 55.   DISCHARGE MEDICATIONS:  1. Risperdal 3 mg at night.  2. Lexapro 20 mg daily.  3. Remeron 15 mg at bedtime.  4. Topamax 25 mg at bedtime.  5. Ambien 10 mg as needed for sleep.   FOLLOW UP:  [facility], [place] [facility]                                               Geoffery Lyons, M.D.    IL/MEDQ  D:   09/11/2003  T:  09/12/2003  Job:  [id number]

## 2011-03-19 NOTE — Discharge Summary (Signed)
[name], [name] NO.:  [id number]   MEDICAL RECORD NO.:  [id number]          PATIENT TYPE:  IPS   LOCATION:  0501                          FACILITY:  BH   PHYSICIAN:  Geoffery Lyons, M.D.      DATE OF BIRTH:  January 12, 1981   DATE OF ADMISSION:  02/02/2008  DATE OF DISCHARGE:  02/07/2008                               DISCHARGE SUMMARY   CHIEF COMPLAINT/HISTORY OF PRESENT ILLNESS:  This was one of several  admissions, the fourth actually, to [facility] for  this 30 year old widowed white male, last admitted in March 2009 for  polysubstance dependence.  He was discharged to a drug rehab program in  [place], left after 5 days as he  claimed he had a hard time getting  along with other residents.  Had  been living in a boarding house, where  he said he was exposed to substance, relapsed 5 days prior to this  admission, using cocaine most every day, using OxyContin, Percocet, also  drinking a significant amount of alcohol.  Endorsed some paranoia,  feeling that people were looking at him and judging him.  Increased  depression as he stayed in the rooming house.  Feeling hopeless.  No  sleep.  Still dealing with his wife's suicide.  She was a heroin addict  according to his report and cut her throat, and he found her in the  bathtub the first part of March.  He  has been separated.   PAST PSYCHIATRIC HISTORY:  Fourth time at [facility], last time  March 11 to 18.  Had been diagnosed with mood disorder, probably  bipolar.  Has some psychotic symptoms, some suicidal thoughts in the  past with hospitalization to [facility], followup at [facility] and the [facility].   MEDICAL HISTORY:  Noncontributory.   MEDICATIONS:  Abilify 10 mg twice a day, trazodone 100 at bedtime,  Prevacid 30 mg per day.   PHYSICAL EXAMINATION:  Failed to show any acute findings.   LABORATORY WORK:  CBC:  White blood cell 12, hemoglobin 15.4,  sodium  137, potassium 3.7, BUN 11, creatinine 1.06, glucose 91.  Alcohol level  less than 5.  UDS positive for cocaine and marijuana.   MENTAL STATUS EXAM:  Reveals alert, cooperative male, complaining of  myalgia, body aches.  Endorsed he has not slept in days, tired.  Endorsed no active homicidal or suicidal ideas, no hallucinations or  delusions.  Cognition was well-preserved.   ADMITTING DIAGNOSES:  AXIS I:  Opiate dependence, cocaine dependence,  alcohol abuse.  Mood disorder NOS.  AXIS II: No diagnosis.  AXIS III:  No diagnosis  AXIS IV:  Moderate.  AXIS V:  On admission 45, highest GAF in the last year 60.   COURSE IN THE HOSPITAL:  He was admitted, started in individual and  group psychotherapy.  Detoxified with Librium.  He was placed on  Abilify, which he felt work for him.  Was given some Rozerem for sleep  and eventually started on Effexor.  As already stated, endorsed  that he  was really depressed and he relapsed and that the program he went to was  not conducive to staying sober.  Felt that he needed to go to a longer-  term treatment program.  Endorsed feeling depressed, suicidal thoughts,  depressed mood and affect, grieving the death of his wife.  He continued  to feel that he needed to go into a residential program long-term.  In  groups he was able to start working on grieving the loss of his wife and  address his mood, and on April 8 he was in full contact with reality.  He felt he was ready to go home.  He was going to stay with a friend  until the staff from [facility] was going to help him to find a more  permanent arrangement.  As he was in full contact with reality with no  active suicidal or homicidal ideas, no hallucinations or delusions, we  went ahead and discharged to outpatient followup.   DISCHARGE DIAGNOSES:  AXIS I:  Opiate, cocaine dependence, alcohol abuse  and mood disorder NOS.  AXIS II:  No diagnosis.  AXIS III:  No diagnosis.  AXIS  IV:  Moderate.  AXIS V:  On discharge 50, 55.   Discharged on naproxen, Protonix 40 mg per day, Rozerem 8 mg at night  and Abilify 10 mg twice a day.   Followup [facility] and [facility].      Geoffery Lyons, M.D.  Electronically Signed     IL/MEDQ  D:  03/06/2008  T:  03/06/2008  Job:  [id number]

## 2011-03-19 NOTE — H&P (Signed)
NAME:  [name], [name] NO.:  [id number]   MEDICAL RECORD NO.:  [id number]                   PATIENT TYPE:  IPS   LOCATION:  0504                                 FACILITY:  [facility]   PHYSICIAN:  Geoffery Lyons, M.D.                   DATE OF BIRTH:  [birth year]   DATE OF ADMISSION:  06/02/2003  DATE OF DISCHARGE:                         PSYCHIATRIC ADMISSION ASSESSMENT   IDENTIFYING INFORMATION:  The patient is a 30 year old single white male  voluntarily admitted on June 02, 2003.   HISTORY OF PRESENT ILLNESS:  The patient presents with a history of  depression and suicidal thoughts, hearing noncommand voices, hearing  whispering.  The patient has also been using cocaine and marijuana for  years.  His last use was three days ago.  He also reports he has been  drinking.  He had six beers the day prior to this admission.  He has a  history of being detoxified in the past.  He feels very guilty and  depressed.  He reports decreased sleep, decreased appetite with a 42 pound  weight loss.  He is currently reporting positive auditory hallucinations but  denies any visual hallucinations or paranoid ideation.   PAST PSYCHIATRIC HISTORY:  This is the second visit.  The patient was here  in June 2003 for psychotic symptoms.  He sees Dr. Melchor Amour in [place].  He  has a history of cutting his wrists in June 2003.  He also reports a history  of a suicide attempt where he went into traffic.  He was at [facility] years ago.  He was detoxified in March 2004 at [facility] in [place].  Longest history of sobriety has been one week.   SUBSTANCE ABUSE HISTORY:  Smokes.  He has been drinking one and a half packs  of beer almost daily.  His last drink was on July 31 where he had six beers.  First drink was at the age of 24.   PAST MEDICAL HISTORY:  Primary care June Rode: None.  Medical problems:  Bronchitis and GERD.   MEDICATIONS:  1. Seroquel 400 mg at  bedtime.  2. Protonix.   DRUG ALLERGIES:  None.   PHYSICAL EXAMINATION:  Physical examination was done at [facility] with no  significant findings.  The patient looks like a well nourished, well  developed male in no acute distress.   LABORATORY DATA:  Urine drug screen was positive for THC.  Alcohol level was  less than 5.  CMET showed a glucose 118.  Hematocrit 54.   SOCIAL HISTORY:  He is a 62 year old single white male.  He lives with  friends.  He last work was in February 2004.  He worked at [employer] in the  past.  He has completed the 12th grade.  He is in violation of probation for  common loitering.  He was in  jail for 30 days.  The patient did state that  he had two children that are with the grandmother in [place].   FAMILY HISTORY:  Mother with drug use and depression.   MENTAL STATUS EXAM:  He is an alert, tall, well nourished male, cooperative  with fair eye contact.  Speech is clear.  Mood is depressed.  Affect is  flat.  Thought processes: Positive auditory hallucinations, no visual  hallucinations, no paranoia or delusions.  Cognitive functioning is intact.  Memory is fair.  Judgment is fair.  Insight is poor.  He is a questionable  historian.   ADMISSION DIAGNOSES:   AXIS I:  1. Psychosis, not otherwise specified.  2. Rule out substance-induced psychosis.  3. Rule out substance-induced mood disorder.   AXIS II:  Deferred.   AXIS III:  1. Gastroesophageal reflux disease.  2. Bronchitis.   AXIS IV:  Problems with primary support group, housing, legal system, and  other psychosocial problems.   AXIS V:  Current is 35, this past year is 20.   INITIAL PLAN OF CARE:  Plan is an admission to [facility] for  depression, suicidal ideation, psychosis, and polysubstance abuse.  Check  every 15 minutes.  Will stabilize mood and thinking so the patient can be  safe.  Will have Librium available after six hours for withdrawal symptoms.  Will initiate  an antidepressant to decrease depressive symptoms.  Will  continue with Seroquel for psychotic symptoms.  Will increase coping skills.  The patient is to attend groups.  Medication compliance was discussed with  the patient.  The patient is to follow up with mental health and follow up  with his appointment.  Case worker is to look at living situation.  Long-  term goal is for the patient to remain alcohol and drug free and attend  NA/AA meetings.   ESTIMATED LENGTH OF STAY:  Four to six days.      Landry Corporal, N.P.                       Geoffery Lyons, M.D.    JO/MEDQ  D:  06/04/2003  T:  06/04/2003  Job:  [id number]

## 2011-03-19 NOTE — Discharge Summary (Signed)
[name], [name] NO.:  [id number]   MEDICAL RECORD NO.:  [id number]          PATIENT TYPE:  IPS   LOCATION:  0502                          FACILITY:  [facility]   PHYSICIAN:  Geoffery Lyons, M.D.      DATE OF BIRTH:  07/29/1981   DATE OF ADMISSION:  01/10/2008  DATE OF DISCHARGE:  01/17/2008                               DISCHARGE SUMMARY   CHIEF COMPLAINT AND PRESENT ILLNESS:  This was the third admission to  [facility] for this 30 year old widowed white male who  relapsed 2-1/2 weeks after 2-1/2 years of abstinence.  He said he is up  to drinking between three and five 40 ounce beers.  He has been using  cocaine, snorting powder, every other day for the past couple of weeks.  Using marijuana most every day.  He is stressed over the grief and guilt  of his ex-wife whom he claims committed suicide.  That happened about 3  weeks ago.  She, according to him, was having a lot of difficulty coping  with the separation, and she left three children behind, 12, 7 and 6,  now living with the grandmother in [place].  He endorsed depression.  Attempted to cut his wrists a couple of months ago.  He went to [facility].  He was started on Abilify 10 and trazodone.  Until  the suicidal gesture, he had been fairly stable on these medications.   PAST PSYCHIATRIC HISTORY:  The [facility] time at Behavior Health.  Last  admission in March of 2006.  At that time, he was having some auditory  hallucination.  Has had prior suicide attempts including attempting to  jump off a bridge and by hanging himself.  He was at [facility] 2 months prior to this admission.  Followed by __________  and  [facility].   ALCOHOL AND DRUG HISTORY:  Persistent use of alcohol and cocaine as  already stated, as well as marijuana.   MEDICAL HISTORY:  Noncontributory.   LABORATORY WORK:  Sodium 139, potassium 4.3, BUN 11, creatinine 1.07,  glucose  86.  Liver enzymes within normal limits.  SGOT 22, SGPT 39.  Bilirubin 1.0.  CBC revealed white blood cells of 10.3, hemoglobin 16.2.  UDS positive for marijuana.   MENTAL STATUS EXAMINATION:  A fully alert cooperative male, blunted and  depressed affect.  Speech was normal in pace, tone and production.  Giving a coherent history.  Fully engaged in conversation.  Mood is  depressed.  Thought processes logical, coherent and relevant.  No  evidence of active suicidal or homicidal ideations.  No active  delusions.  No hallucinations.  Cognition well-preserved.   ADMITTING DIAGNOSES:  AXIS I:  Alcohol dependence, cocaine dependence.  Depressive disorder not otherwise specified.  AXIS II:  No diagnosis.  AXIS III:  No diagnosis.  AXIS IV:  Moderate.  AXIS V:  Upon admission 35; GAF in the last year 60.   COURSE IN THE HOSPITAL:  He was admitted.  He was started in individual  and group psychotherapy.  He was given Ambien for sleep.  He was  detoxified with Librium and was placed on Symmetrel for cravings.  He  was maintained on Abilify 10 mg per day.  As already stated, he has been  drinking three to four 40 ounce beers a day, marijuana every day,  cocaine every other day, snorting.  Two-and-a-half years abstinence.  Stressful events.  His ex-wife committed suicide.  The divorce was going  to be final.  The kids are with the grandmother in [place].  Not working.  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  Receives SSI.  He been on Abilify and  trazodone.  He has been staying at [facility]; prior to that he was  in [facility] and.  He was going to go into a halfway house, although  considering a group home.  The patient was detoxed. We worked on Materials engineer, we worked on relapse prevention.  Endorsed that he needed the  structure, so he worked on getting accepted into a halfway house.  Endorsed that he really does well when he has this structure.  On January 14, 2008, noticed a lot of anxiety.   He was having a hard time.  Endorsed some difficulty with the GI cramping in stomach.  He did say he  was also abusing opiates.  He did not discuss how much.  He called the  halfway house, and they told him they did not have a bed.  He started  considering going into a group home, as he needed the structure.  He  developed some GI symptoms.  On January 16, 2008, he was still dealing  with a lot of anxiety.  Endorsed dreams and nightmares about his ex-wife  who killed herself.  She was going to be buried the following week.  She  was undergoing autopsy.  Did endorse that he knew that he would not have  been able to help her, but endorsed that all this time he had thought  that he could have done something about it.  He continued to detox and  work on Pharmacologist, relapse prevention, and on January 17, 2008, he was  in full contact with reality.  There were no active suicidal or  homicidal ideas, no hallucinations, no delusions.  He was going to  pursue outpatient treatment.  He was going to go to a residential  treatment program.   DISCHARGE DIAGNOSES:  AXIS I:  Alcohol, opiate, cocaine and marijuana  abuse.  Depressive disorder not otherwise specified.  AXIS II:  No diagnosis.  AXIS III:  No diagnosis.  AXIS IV:  Moderate.  AXIS V:  Upon discharge 50.   DISCHARGE MEDICATIONS:  1. Abilify 10 mg per day.  2. Symmetrel 100 twice a day.  3. Protonix 40 mg per day.  4. Trazodone 100 at bedtime for sleep.   FOLLOWUP:  __________ .      Geoffery Lyons, M.D.  Electronically Signed     IL/MEDQ  D:  02/13/2008  T:  02/13/2008  Job:  [id number]

## 2011-03-19 NOTE — Discharge Summary (Signed)
[name], BEAVERS NO.:  [id number]   MEDICAL RECORD NO.:  [id number]          PATIENT TYPE:  IPS   LOCATION:  0505                          FACILITY:  BH   PHYSICIAN:  Geoffery Lyons, M.D.      DATE OF BIRTH:  1981/03/15   DATE OF ADMISSION:  11/06/2008  DATE OF DISCHARGE:  11/08/2008                               DISCHARGE SUMMARY   CHIEF COMPLAINT AND HISTORY OF PRESENT ILLNESS:  This was one of  multiple admissions to [facility] for this 30 year old  male who presented in the emergency room upset, claiming homicidal  thoughts over his pregnant girlfriend after she revealed to him that the  father of her expected baby in fact was his best friend and not him.  He  used alcohol and cocaine and marijuana, unclear amounts, within an hour  of initially presenting.  He was agitated, unsafe, out of control, did  not feel that the Abilify was helping.   PAST PSYCHIATRIC HISTORY:  History of multiple admissions to [facility] followed by the [facility] support.   ALCOHOL AND DRUG HISTORY:  He has a history of opiate abuse, alcohol,  cocaine and marijuana abuse.  He most recently had several relapses this  past year on alcohol and cocaine, a prior history of suicidal gestures.  He attempted to jump off a bridge and attempted to hang himself in the  past.   MEDICAL HISTORY:  Gastroesophageal reflux.   MEDICATION:  Prevacid 30 mg per day, Abilify 20 mg per day, trazodone  100 mg at night.   PHYSICAL EXAM:  Failed to show any acute findings.   LABORATORY WORK:  UDS positive for marijuana.  Blood chemistry within  normal limits.  SGOT 24, SGPT 32, total bilirubin 0.5.  TSH 2.113.  RPR  nonreactive.   MENTAL STATUS EXAM:  Revealed an alert cooperative male who endorsed he  was feeling agitated.  Speech was normal rate, tempo and production.  Mood anxious, agitated.  Affect broad.  Thought processes logical,  coherent and relevant.  No active delusions.  No active suicidal or  homicidal ideas.  No hallucinations.  Cognition well preserved.   ADMISSION DIAGNOSES:  AXIS I:  Alcohol and cocaine dependence.  Mood disorder not otherwise specified.  AXIS II:  No diagnosis.  AXIS III:  Gastroesophageal reflux.  AXIS IV:  Moderate.  AXIS V:  Upon admission 35, global assessment of functioning in the last  year 60.   COURSE IN THE HOSPITAL:  He was admitted, started in individual and  group psychotherapy.  We helped detox with some Librium p.r.n.  He was  given trazodone for sleep and he was placed on Geodon to substitute the  Abilify.  As already stated, he endorsed that he had a difficult time  during the holidays.  Endorsed he does not do well this time of the year  and shortly after the new year found out that the girlfriend was  pregnant with a friend's child and not his.  Claimed this led to  the  relapse.  After the relapse he got more and more depressed with suicidal  ideations.  Asked for help.  He was placed on Geodon.  That agrees with  him.  He felt better on it and by November 08, 2008 he felt better.  He  said he was not suicidal.  He was wanting to give the Geodon a try.  He  has a place to go and wanting to be discharged.  As he was not suicidal  or homicidal and we had already switched medication, we went ahead and  discharged him to outpatient followup.   DISCHARGE DIAGNOSES:  AXIS I:  Alcohol and cocaine dependence.  Mood disorder not otherwise specified.  AXIS II:  No diagnosis.  AXIS III:  Gastroesophageal reflux.  AXIS IV:  Moderate.  AXIS V:  Global assessment of functioning on discharge 50.   Discharged on Geodon 60 mg twice a day, Protonix 40 mg per day,  trazodone 100 mg at night.   FOLLOWUP:  [facility] and [facility].      Geoffery Lyons, M.D.  Electronically Signed     IL/MEDQ  D:  11/28/2008  T:  11/28/2008  Job:  [id number]
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
## 2011-05-30 ENCOUNTER — Emergency Department (HOSPITAL_COMMUNITY)
Admission: EM | Admit: 2011-05-30 | Discharge: 2011-05-30 | Disposition: A | Discharge status: Home / Self Care | Payer: Medicaid Other | Attending: Emergency Medicine | Admitting: Emergency Medicine

## 2011-05-30 DIAGNOSIS — N61 Mastitis without abscess: Secondary | ICD-10-CM | POA: Insufficient documentation

## 2011-05-30 DIAGNOSIS — F209 Schizophrenia, unspecified: Secondary | ICD-10-CM | POA: Insufficient documentation

## 2011-05-30 DIAGNOSIS — F319 Bipolar disorder, unspecified: Secondary | ICD-10-CM | POA: Insufficient documentation

## 2011-07-23 LAB — URINALYSIS, ROUTINE W REFLEX MICROSCOPIC
Bilirubin Urine: NEGATIVE
Glucose, UA: NEGATIVE
Hgb urine dipstick: NEGATIVE
Ketones, ur: 15 — AB
Nitrite: NEGATIVE
Specific Gravity, Urine: 1.029
pH: 6

## 2011-07-23 LAB — I-STAT 8, (EC8 V) (CONVERTED LAB)
BUN: 18
Glucose, Bld: 90
Hemoglobin: 15.6
Potassium: 3.8
Sodium: 140
pH, Ven: 7.363 — ABNORMAL HIGH

## 2011-07-23 LAB — CBC
Hemoglobin: 14.5
MCHC: 33.8
MCV: 87.3
RBC: 4.9
WBC: 13.6 — ABNORMAL HIGH

## 2011-07-23 LAB — DIFFERENTIAL
Eosinophils Absolute: 0.1
Lymphs Abs: 2.5
Monocytes Absolute: 1.2 — ABNORMAL HIGH
Monocytes Relative: 9
Neutrophils Relative %: 72

## 2011-07-23 LAB — RAPID URINE DRUG SCREEN, HOSP PERFORMED
Barbiturates: NOT DETECTED
Cocaine: NOT DETECTED
Opiates: NOT DETECTED
Tetrahydrocannabinol: POSITIVE — AB

## 2011-07-23 LAB — POCT I-STAT CREATININE: Creatinine, Ser: 1.3

## 2011-07-26 LAB — DIFFERENTIAL
Basophils Relative: 1
Eosinophils Absolute: 0.1
Eosinophils Relative: 1
Monocytes Absolute: 1
Monocytes Relative: 9
Neutro Abs: 6.7

## 2011-07-26 LAB — COMPREHENSIVE METABOLIC PANEL
ALT: 39
AST: 22
Albumin: 4.1
Alkaline Phosphatase: 71
GFR calc Af Amer: >60
Potassium: 4.3
Sodium: 139
Total Protein: 7.4

## 2011-07-26 LAB — RAPID URINE DRUG SCREEN, HOSP PERFORMED
Cocaine: NOT DETECTED
Tetrahydrocannabinol: POSITIVE — AB

## 2011-07-26 LAB — CBC
Platelets: 155
RDW: 13.7
WBC: 10.3

## 2011-07-26 LAB — ETHANOL: Alcohol, Ethyl (B): <5

## 2011-07-27 LAB — RAPID URINE DRUG SCREEN, HOSP PERFORMED
Amphetamines: NOT DETECTED
Amphetamines: NOT DETECTED
Barbiturates: NOT DETECTED
Benzodiazepines: POSITIVE — AB
Cocaine: NOT DETECTED
Opiates: NOT DETECTED
Tetrahydrocannabinol: POSITIVE — AB

## 2011-07-27 LAB — CBC
HCT: 41.4
HCT: 43.8
Hemoglobin: 14.6
Hemoglobin: 15.4
MCV: 86.4
RBC: 4.79
RDW: 13.5
WBC: 12 — ABNORMAL HIGH
WBC: 15.4 — ABNORMAL HIGH

## 2011-07-27 LAB — DIFFERENTIAL
Basophils Absolute: 0.1
Eosinophils Absolute: 0.1
Eosinophils Relative: 1
Lymphocytes Relative: 22
Lymphs Abs: 2.1
Lymphs Abs: 2.7
Monocytes Absolute: 1.4 — ABNORMAL HIGH
Monocytes Absolute: 1.9 — ABNORMAL HIGH
Monocytes Relative: 12
Neutro Abs: 7.7
Neutrophils Relative %: 73

## 2011-07-27 LAB — BASIC METABOLIC PANEL
Calcium: 9.2
Chloride: 101
GFR calc Af Amer: >60
GFR calc Af Amer: >60
GFR calc non Af Amer: >60
Glucose, Bld: 91
Potassium: 3.6
Potassium: 3.7
Sodium: 137
Sodium: 137

## 2011-07-27 LAB — URINALYSIS, ROUTINE W REFLEX MICROSCOPIC
Glucose, UA: NEGATIVE
Ketones, ur: NEGATIVE
Protein, ur: NEGATIVE
Urobilinogen, UA: 1

## 2011-07-27 LAB — ETHANOL
Alcohol, Ethyl (B): 5
Alcohol, Ethyl (B): <5

## 2011-07-27 LAB — HEPATIC FUNCTION PANEL
ALT: 27
Albumin: 3.9
Indirect Bilirubin: 0.6
Total Protein: 6.8

## 2011-07-28 LAB — POCT CARDIAC MARKERS
CKMB, poc: <1 — ABNORMAL LOW
CKMB, poc: <1 — ABNORMAL LOW
Troponin i, poc: <0.05
Troponin i, poc: <0.05

## 2011-07-28 LAB — URINALYSIS, ROUTINE W REFLEX MICROSCOPIC
Bilirubin Urine: NEGATIVE
Hgb urine dipstick: NEGATIVE
Specific Gravity, Urine: 1.021
pH: 5.5

## 2011-07-28 LAB — DIFFERENTIAL
Lymphocytes Relative: 22
Lymphs Abs: 1.9
Monocytes Absolute: 0.9
Monocytes Relative: 10
Neutro Abs: 5.6

## 2011-07-28 LAB — ETHANOL: Alcohol, Ethyl (B): <5

## 2011-07-28 LAB — RAPID URINE DRUG SCREEN, HOSP PERFORMED
Amphetamines: NOT DETECTED
Amphetamines: NOT DETECTED
Barbiturates: POSITIVE — AB
Benzodiazepines: NOT DETECTED
Cocaine: POSITIVE — AB
Opiates: NOT DETECTED
Tetrahydrocannabinol: NOT DETECTED

## 2011-07-28 LAB — POCT I-STAT, CHEM 8
BUN: 16
Calcium, Ion: 1.15
Chloride: 105
Glucose, Bld: 103 — ABNORMAL HIGH
Glucose, Bld: 82
HCT: 45
Hemoglobin: 15.3
Potassium: 3.8
Potassium: 4.4

## 2011-07-28 LAB — CBC
Hemoglobin: 15.7
RBC: 5.29

## 2011-07-29 LAB — DIFFERENTIAL
Basophils Relative: 2 — ABNORMAL HIGH
Basophils Relative: 1
Eosinophils Absolute: 0.1
Lymphs Abs: 2.5
Lymphs Abs: 2.3
Monocytes Absolute: 1
Monocytes Absolute: 0.9
Monocytes Relative: 9
Monocytes Relative: 7
Neutro Abs: 7.3

## 2011-07-29 LAB — POCT I-STAT, CHEM 8
BUN: 11
Calcium, Ion: 1.2
Calcium, Ion: 1.11 — ABNORMAL LOW
Chloride: 103
Chloride: 104
Creatinine, Ser: 1.2
Creatinine, Ser: 1.1
Glucose, Bld: 86
Glucose, Bld: 81
HCT: 50
HCT: 50
Hemoglobin: 17
Potassium: 4.3
Potassium: 4.2

## 2011-07-29 LAB — CBC
Hemoglobin: 16.1
Hemoglobin: 16.1
MCHC: 34.4
MCHC: 34.2
MCV: 87.9
RBC: 5.38
RBC: 5.34
WBC: 11.3 — ABNORMAL HIGH
WBC: 13 — ABNORMAL HIGH

## 2011-07-29 LAB — RAPID URINE DRUG SCREEN, HOSP PERFORMED
Amphetamines: NOT DETECTED
Barbiturates: NOT DETECTED
Benzodiazepines: NOT DETECTED
Cocaine: NOT DETECTED
Opiates: NOT DETECTED
Tetrahydrocannabinol: POSITIVE — AB

## 2011-07-30 LAB — BASIC METABOLIC PANEL
BUN: 16
Chloride: 106
Creatinine, Ser: 1.03
GFR calc Af Amer: >60
GFR calc non Af Amer: >60

## 2011-07-30 LAB — DIFFERENTIAL
Basophils Absolute: 0
Basophils Relative: 0
Eosinophils Absolute: 0.1
Eosinophils Relative: 1
Monocytes Absolute: 1
Neutro Abs: 7.3

## 2011-07-30 LAB — RAPID URINE DRUG SCREEN, HOSP PERFORMED
Benzodiazepines: NOT DETECTED
Cocaine: POSITIVE — AB
Tetrahydrocannabinol: POSITIVE — AB

## 2011-07-30 LAB — CBC
Platelets: 149 — ABNORMAL LOW
RBC: 5.02
WBC: 11.4 — ABNORMAL HIGH

## 2011-07-30 LAB — ETHANOL: Alcohol, Ethyl (B): <5

## 2011-08-02 LAB — DIFFERENTIAL
Eosinophils Relative: 1
Lymphocytes Relative: 25
Lymphs Abs: 3
Monocytes Absolute: 1
Monocytes Relative: 8
Neutro Abs: 8 — ABNORMAL HIGH

## 2011-08-02 LAB — POCT I-STAT, CHEM 8
BUN: 12
Calcium, Ion: 1.17
Chloride: 105
Creatinine, Ser: 1.2
TCO2: 25

## 2011-08-02 LAB — CBC
HCT: 46
Hemoglobin: 15.8
RBC: 5.18
WBC: 12.1 — ABNORMAL HIGH

## 2011-08-02 LAB — RAPID URINE DRUG SCREEN, HOSP PERFORMED: Cocaine: NOT DETECTED

## 2011-08-02 LAB — ETHANOL: Alcohol, Ethyl (B): <5

## 2011-08-06 LAB — RAPID URINE DRUG SCREEN, HOSP PERFORMED
Amphetamines: NOT DETECTED
Barbiturates: NOT DETECTED
Benzodiazepines: NOT DETECTED
Cocaine: NOT DETECTED

## 2011-08-06 LAB — DIFFERENTIAL
Basophils Absolute: 0 10*3/uL (ref 0.0–0.1)
Basophils Relative: 0 % (ref 0–1)
Eosinophils Absolute: 0.1 10*3/uL (ref 0.0–0.7)
Eosinophils Relative: 1 % (ref 0–5)
Neutrophils Relative %: 61 % (ref 43–77)

## 2011-08-06 LAB — BASIC METABOLIC PANEL
BUN: 10 mg/dL (ref 6–23)
Chloride: 105 mEq/L (ref 96–112)
Creatinine, Ser: 1.05 mg/dL (ref 0.4–1.5)
Glucose, Bld: 112 mg/dL — ABNORMAL HIGH (ref 70–99)
Potassium: 4.1 mEq/L (ref 3.5–5.1)

## 2011-08-06 LAB — CBC
HCT: 46.2 % (ref 39.0–52.0)
MCHC: 33.5 g/dL (ref 30.0–36.0)
MCV: 91.5 fL (ref 78.0–100.0)
Platelets: 140 10*3/uL — ABNORMAL LOW (ref 150–400)
RDW: 13.7 % (ref 11.5–15.5)
WBC: 10.7 10*3/uL — ABNORMAL HIGH (ref 4.0–10.5)

## 2011-08-06 LAB — URINALYSIS, ROUTINE W REFLEX MICROSCOPIC
Glucose, UA: NEGATIVE mg/dL
Hgb urine dipstick: NEGATIVE
Ketones, ur: NEGATIVE mg/dL
Protein, ur: NEGATIVE mg/dL
pH: 7 (ref 5.0–8.0)

## 2011-08-06 LAB — HEPATIC FUNCTION PANEL
Albumin: 4.3 g/dL (ref 3.5–5.2)
Alkaline Phosphatase: 54 U/L (ref 39–117)
Indirect Bilirubin: 0.4 mg/dL (ref 0.3–0.9)
Total Protein: 6.9 g/dL (ref 6.0–8.3)

## 2011-08-06 LAB — ETHANOL: Alcohol, Ethyl (B): <5 mg/dL (ref 0–10)

## 2012-02-15 ENCOUNTER — Encounter (HOSPITAL_COMMUNITY): Payer: Self-pay | Admitting: *Deleted

## 2012-02-15 ENCOUNTER — Emergency Department (HOSPITAL_COMMUNITY)
Admission: EM | Admit: 2012-02-15 | Discharge: 2012-02-15 | Disposition: A | Discharge status: Home / Self Care | Payer: Medicaid Other | Attending: Emergency Medicine | Admitting: Emergency Medicine

## 2012-02-15 DIAGNOSIS — R21 Rash and other nonspecific skin eruption: Secondary | ICD-10-CM | POA: Insufficient documentation

## 2012-02-15 DIAGNOSIS — B029 Zoster without complications: Secondary | ICD-10-CM | POA: Insufficient documentation

## 2012-02-15 DIAGNOSIS — Z79899 Other long term (current) drug therapy: Secondary | ICD-10-CM | POA: Insufficient documentation

## 2012-02-15 DIAGNOSIS — F319 Bipolar disorder, unspecified: Secondary | ICD-10-CM | POA: Insufficient documentation

## 2012-02-15 HISTORY — DX: Bipolar disorder, unspecified: F31.9

## 2012-02-15 MED ORDER — FLUORESCEIN SODIUM 1 MG OP STRP
ORAL_STRIP | OPHTHALMIC | Status: AC
  Administered 2012-02-15: 13:00:00
  Filled 2012-02-15: qty 1

## 2012-02-15 MED ORDER — ACYCLOVIR 400 MG PO TABS: 400.0000 mg | ORAL_TABLET | Freq: Four times a day (QID) | ORAL | Status: DC

## 2012-02-15 MED ORDER — OXYCODONE-ACETAMINOPHEN 5-325 MG PO TABS: 1.0000 | ORAL_TABLET | Freq: Four times a day (QID) | ORAL | Status: AC | PRN

## 2012-02-15 MED ORDER — ACYCLOVIR 400 MG PO TABS: 800.0000 mg | ORAL_TABLET | Freq: Every day | ORAL | Status: AC

## 2012-02-15 NOTE — ED Provider Notes (Signed)
Medical screening examination/treatment/procedure(s) were performed by non-physician practitioner and as supervising physician I was immediately available for consultation/collaboration.  Doug Sou, MD 02/15/12 2117

## 2012-02-15 NOTE — Discharge Instructions (Signed)

## 2012-02-15 NOTE — ED Notes (Signed)
Pt reports wkaing up on Saturday morning with multiple bug bites to left side of forehead, pt reports discomfort is shooting down his face into his shoulder. Pt reports dizziness and diarrhea. No fever noted. Pt with scabbing noted to left forehead.

## 2012-02-15 NOTE — ED Provider Notes (Signed)
History     CSN: [id number]  Arrival date & time 02/15/12  1145   First MD Initiated Contact with Patient 02/15/12 1205      Chief Complaint  Patient presents with  . Insect Bite    (Consider location/radiation/quality/duration/timing/severity/associated sxs/prior treatment) HPI Comments: Patient reports that he first noticed a bump on the left side of his forehead three days ago.  He reports that the bumps then multiplied and progressed to several bumps on the left side of the forehead.  No bumps anywhere else.  Today he began having shooting pulsating pain on the left side of his face.  He thought that it was an insect bite, but states that he did not feel a bite.  He reports that he has had Varicella in the past, but has never had Shingles.  He is not immunocompromised.  He denies any eye pain or changes in vision.    The history is provided by the patient.    Past Medical History  Diagnosis Date  . Bipolar 1 disorder     History reviewed. No pertinent past surgical history.  History reviewed. No pertinent family history.  History  Substance Use Topics  . Smoking status: Current Everyday Smoker  . Smokeless tobacco: Not on file  . Alcohol Use: No      Review of Systems  Constitutional: Negative for fever and chills.  HENT: Negative for facial swelling, neck pain and neck stiffness.   Eyes: Negative for photophobia, pain, discharge, redness and visual disturbance.  Respiratory: Negative for shortness of breath.   Skin: Positive for rash.  Neurological: Negative for dizziness, facial asymmetry and light-headedness.    Allergies  Review of patient's allergies indicates no known allergies.  Home Medications   Current Outpatient Rx  Name Route Sig Dispense Refill  . ARIPIPRAZOLE 10 MG PO TABS Oral Take 10 mg by mouth daily.    Marland Kitchen LANSOPRAZOLE 30 MG PO CPDR Oral Take 30 mg by mouth daily.    . TRAZODONE HCL 100 MG PO TABS Oral Take 100 mg by mouth at bedtime.       BP 146/89  Pulse 79  Temp(Src) 98.1 F (36.7 C) (Oral)  Resp 16  SpO2 100%  Physical Exam  Nursing note and vitals reviewed. Constitutional: He appears well-developed and well-nourished. No distress.  HENT:  Head: Normocephalic and atraumatic.  Mouth/Throat: Oropharynx is clear and moist.  Eyes: EOM are normal. Pupils are equal, round, and reactive to light. Right conjunctiva is not injected. Left conjunctiva is not injected.       No dendrites visualized with fluorescein exam.  Neck: Normal range of motion.  Cardiovascular: Normal rate, regular rhythm and normal heart sounds.   Pulmonary/Chest: Effort normal and breath sounds normal.  Neurological: He is alert.  Skin: Skin is warm and dry. He is not diaphoretic.          Negative Hutchinson's sign  Psychiatric: He has a normal mood and affect.    ED Course  Procedures (including critical care time)  Labs Reviewed - No data to display No results found.   1. Shingles     Eye was examined with Fluroescein stain and Wood's lamp to evaluate for dendrites.  No dendrites visualized.  MDM  Signs and symptoms consistent with Shingles.  No dendrites visualized on eye exam.  No eye pain or vision changes.  Negative Hutchinson's sign.  Patient given prescription for Acyclovir and short course of pain medication.  Pascal Lux Dermott, PA-C 02/15/12 1806

## 2012-06-06 ENCOUNTER — Emergency Department (INDEPENDENT_AMBULATORY_CARE_PROVIDER_SITE_OTHER)
Admission: EM | Admit: 2012-06-06 | Discharge: 2012-06-06 | Disposition: A | Discharge status: Home / Self Care | Payer: Medicaid Other | Source: Home / Self Care | Attending: Emergency Medicine | Admitting: Emergency Medicine

## 2012-06-06 ENCOUNTER — Encounter (HOSPITAL_COMMUNITY): Payer: Self-pay | Admitting: Emergency Medicine

## 2012-06-06 DIAGNOSIS — K0401 Reversible pulpitis: Secondary | ICD-10-CM

## 2012-06-06 MED ORDER — CLINDAMYCIN HCL 300 MG PO CAPS: 300.0000 mg | ORAL_CAPSULE | Freq: Four times a day (QID) | ORAL | Status: AC

## 2012-06-06 MED ORDER — HYDROCODONE-ACETAMINOPHEN 5-325 MG PO TABS: ORAL_TABLET | ORAL | Status: AC

## 2012-06-06 MED ORDER — DICLOFENAC SODIUM 75 MG PO TBEC: 75.0000 mg | DELAYED_RELEASE_TABLET | Freq: Two times a day (BID) | ORAL | Status: DC

## 2012-06-06 NOTE — ED Provider Notes (Signed)
Chief Complaint  Patient presents with  . Dental Pain    History of Present Illness:   Mr. [name] is a 31 year old male who presents with a six-day history of pain in his left lower second and third molars. He has some swelling of the gingiva and on his cheek. He feels a little bit chilled and has had some headache. Hurts to chew on that side and open his mouth wide. He denies any pain on swallowing or difficulty breathing. He's had no fever, coughing, wheezing, or shortness of breath.  Review of Systems:  Other than noted above, the patient denies any of the following symptoms: Systemic:  No fever, chills, sweats or weight loss. ENT:  No headache, ear ache, sore throat, nasal congestion, facial pain, or swelling. Lymphatic:  No adenopathy. Lungs:  No coughing, wheezing or shortness of breath.  PMFSH:  Past medical history, family history, social history, meds, and allergies were reviewed.  Physical Exam:   Vital signs:  BP 146/92  Pulse 68  Temp 97.5 F (36.4 C) (Oral)  Resp 20  SpO2 98% General:  Alert, oriented, in no distress. ENT:  TMs and canals normal.  Nasal mucosa normal. Mouth exam:  He has widespread dental decay. The left lower second and third molars are decayed down to the gumline and the right second third molars are about the same. There is no swelling of the gingiva, no purulent drainage, no swelling of the throat of the floor the mouth. The pharynx is clear. He also has a polypoid lesion of the hard palate on the left. Neck:  No swelling or adenopathy. Lungs:  Breath sounds clear and equal bilaterally.  No wheezes, rales or rhonchi. Heart:  Regular rhythm.  No gallops or murmers. Skin:  Clear, warm and dry.  Assessment:  The encounter diagnosis was Pulpitis.  Plan:   1.  The following meds were prescribed:   New Prescriptions   CLINDAMYCIN (CLEOCIN) 300 MG CAPSULE    Take 1 capsule (300 mg total) by mouth 4 (four) times daily.   DICLOFENAC (VOLTAREN) 75 MG EC  TABLET    Take 1 tablet (75 mg total) by mouth 2 (two) times daily.   HYDROCODONE-ACETAMINOPHEN (NORCO/VICODIN) 5-325 MG PER TABLET    1 to 2 tabs every 4 to 6 hours as needed for pain.   2.  The patient was instructed in symptomatic care and handouts were given. 3.  The patient was told to return if becoming worse in any way, if no better in 3 or 4 days, and given some red flag symptoms that would indicate earlier return, especially difficulty breathing. 4.  The patient was told to follow up with a dentist as soon as possible. He also was informed about the polypoid lesion and he'll need to have this removed as well.    Reuben Likes, MD 06/06/12 [phone]

## 2012-06-06 NOTE — ED Notes (Signed)
Reports breaking left wisdom tooth, bottom last week.  Patient was eating peanuts at the time.  Has had pain for one week.  Reports family member noticed swelling and encouraged him to come today, here.  Waiting for medicaid to see a dentist.  Denies fever

## 2012-10-15 ENCOUNTER — Emergency Department (HOSPITAL_COMMUNITY)
Admission: EM | Admit: 2012-10-15 | Discharge: 2012-10-15 | Disposition: A | Discharge status: Home / Self Care | Payer: Medicaid Other | Attending: Emergency Medicine | Admitting: Emergency Medicine

## 2012-10-15 ENCOUNTER — Encounter (HOSPITAL_COMMUNITY): Payer: Self-pay | Admitting: Emergency Medicine

## 2012-10-15 DIAGNOSIS — F17200 Nicotine dependence, unspecified, uncomplicated: Secondary | ICD-10-CM | POA: Insufficient documentation

## 2012-10-15 DIAGNOSIS — R110 Nausea: Secondary | ICD-10-CM | POA: Insufficient documentation

## 2012-10-15 DIAGNOSIS — R109 Unspecified abdominal pain: Secondary | ICD-10-CM

## 2012-10-15 DIAGNOSIS — R634 Abnormal weight loss: Secondary | ICD-10-CM | POA: Insufficient documentation

## 2012-10-15 DIAGNOSIS — F319 Bipolar disorder, unspecified: Secondary | ICD-10-CM | POA: Insufficient documentation

## 2012-10-15 DIAGNOSIS — R1084 Generalized abdominal pain: Secondary | ICD-10-CM | POA: Insufficient documentation

## 2012-10-15 LAB — URINALYSIS, ROUTINE W REFLEX MICROSCOPIC
Glucose, UA: NEGATIVE mg/dL
Ketones, ur: NEGATIVE mg/dL
Leukocytes, UA: NEGATIVE
Nitrite: NEGATIVE
Specific Gravity, Urine: 1.023 (ref 1.005–1.030)
pH: 6 (ref 5.0–8.0)

## 2012-10-15 LAB — COMPREHENSIVE METABOLIC PANEL
ALT: 26 U/L (ref 0–53)
AST: 18 U/L (ref 0–37)
Albumin: 3.8 g/dL (ref 3.5–5.2)
CO2: 27 mEq/L (ref 19–32)
Chloride: 101 mEq/L (ref 96–112)
Creatinine, Ser: 1.09 mg/dL (ref 0.50–1.35)
Sodium: 138 mEq/L (ref 135–145)
Total Bilirubin: 0.2 mg/dL — ABNORMAL LOW (ref 0.3–1.2)

## 2012-10-15 LAB — CBC WITH DIFFERENTIAL/PLATELET
Eosinophils Absolute: 0.2 10*3/uL (ref 0.0–0.7)
Eosinophils Relative: 2 % (ref 0–5)
HCT: 46 % (ref 39.0–52.0)
Lymphocytes Relative: 26 % (ref 12–46)
Lymphs Abs: 2.6 10*3/uL (ref 0.7–4.0)
MCH: 30 pg (ref 26.0–34.0)
MCV: 89.1 fL (ref 78.0–100.0)
Monocytes Absolute: 1.1 10*3/uL — ABNORMAL HIGH (ref 0.1–1.0)
RBC: 5.16 MIL/uL (ref 4.22–5.81)
WBC: 9.7 10*3/uL (ref 4.0–10.5)

## 2012-10-15 MED ORDER — OXYCODONE-ACETAMINOPHEN 5-325 MG PO TABS: 1.0000 | ORAL_TABLET | ORAL | Status: DC | PRN

## 2012-10-15 MED ORDER — ONDANSETRON HCL 4 MG/2ML IJ SOLN
4.0000 mg | Freq: Once | INTRAMUSCULAR | Status: AC
  Administered 2012-10-15: 4 mg via INTRAVENOUS
  Filled 2012-10-15: qty 2

## 2012-10-15 MED ORDER — OXYCODONE-ACETAMINOPHEN 5-325 MG PO TABS
2.0000 | ORAL_TABLET | Freq: Once | ORAL | Status: AC
  Administered 2012-10-15: 2 via ORAL
  Filled 2012-10-15: qty 2

## 2012-10-15 MED ORDER — MORPHINE SULFATE 4 MG/ML IJ SOLN
4.0000 mg | Freq: Once | INTRAMUSCULAR | Status: AC
  Administered 2012-10-15: 4 mg via INTRAVENOUS
  Filled 2012-10-15: qty 1

## 2012-10-15 NOTE — ED Provider Notes (Signed)
History     CSN: [id number]  Arrival date & time 10/15/12  1007   First MD Initiated Contact with Patient 10/15/12 1040      Chief Complaint  Patient presents with  . Abdominal Pain     Patient is a 31 y.o. male presenting with abdominal pain. The history is provided by the patient.  Abdominal Pain The primary symptoms of the illness include abdominal pain, fatigue and nausea. The primary symptoms of the illness do not include fever, vomiting, diarrhea, hematemesis, hematochezia or dysuria. The current episode started more than 2 days ago. The onset of the illness was gradual. The problem has been gradually worsening.  The patient has not had a change in bowel habit. Additional symptoms associated with the illness include chills.  pt reports diffuse abdominal pain for past 2-3 days No change in bowel habits No cp.  No fever.  He reports significant wt loss (40 lbs in 2 weeks) of unclear etiology He denies night sweats No dysuria No focal weakness   Past Medical History  Diagnosis Date  . Bipolar 1 disorder     History reviewed. No pertinent past surgical history.  No family history on file.  History  Substance Use Topics  . Smoking status: Current Every Day Smoker  . Smokeless tobacco: Not on file  . Alcohol Use: No      Review of Systems  Constitutional: Positive for chills, fatigue and unexpected weight change. Negative for fever.  Respiratory: Negative for chest tightness.   Cardiovascular: Negative for chest pain.  Gastrointestinal: Positive for nausea and abdominal pain. Negative for vomiting, diarrhea, blood in stool, hematochezia and hematemesis.  Genitourinary: Negative for dysuria.  Neurological: Negative for tremors.  Psychiatric/Behavioral: Negative for agitation.  All other systems reviewed and are negative.    Allergies  Review of patient's allergies indicates no known allergies.  Home Medications   Current Outpatient Rx  Name  Route  Sig   Dispense  Refill  . IBUPROFEN 200 MG PO TABS   Oral   Take 400 mg by mouth every 6 (six) hours as needed. For pain           BP 119/76  Pulse 69  Temp 97.6 F (36.4 C) (Oral)  Resp 18  SpO2 99%  Physical Exam CONSTITUTIONAL: Well developed/well nourished HEAD AND FACE: Normocephalic/atraumatic EYES: EOMI/PERRL, no icterus ENMT: Mucous membranes moist NECK: supple no meningeal signs SPINE:entire spine nontender CV: S1/S2 noted, no murmurs/rubs/gallops noted LUNGS: Lungs are clear to auscultation bilaterally, no apparent distress ABDOMEN: soft, diffuse tenderness, tenderness is moderate, no rebound or guarding GU:no cva tenderness, no testicular tenderness, no hernia NEURO: Pt is awake/alert, moves all extremitiesx4 EXTREMITIES: pulses normal, full ROM SKIN: warm, color normal PSYCH: no abnormalities of mood noted  ED Course  Procedures  Labs Reviewed  CBC WITH DIFFERENTIAL - Abnormal; Notable for the following:    Platelets 134 (*)     Monocytes Absolute 1.1 (*)     All other components within normal limits  URINALYSIS, ROUTINE W REFLEX MICROSCOPIC  COMPREHENSIVE METABOLIC PANEL  LIPASE, BLOOD    Pt improved.  His abdomen is soft without any acute findings. Labs reassuring   I gave him option of CT imaging but he would like to go home and monitor on his own.  He is well appearing.  He reports significant wt loss which I advised needs close f/u and workup as outpatient.  He has no personal h/o cancer.  He does not  know his family so unsure if familial h/o cancer I doubt abdominal process given his exam.  I spoke about strict return precautions including worsened pain, radiation of pain to RLQ, fever/vomiting over next 8-12 hours.  He agrees with plan  MDM  Nursing notes including past medical history and social history reviewed and considered in documentation Labs/vital reviewed and considered         Joya Gaskins, MD 10/15/12 [phone]

## 2012-10-15 NOTE — ED Notes (Signed)
Pt c/o upper abdominal pain x 3 days. Pt denies n/v. Last BM today, normal in consistency. Pt took ibuprofen last night without relief. Pt reports pain radiates into chest. Pt also reports weight loss in past 2 weeks. Pt was wearing size 42/44 jeans that were tight, wore size 38 jeans yesterday that were almost falling off.

## 2012-10-15 NOTE — ED Notes (Signed)
Pt and wife given drinks ok w/dr wickline requesting bus passes

## 2012-10-15 NOTE — ED Notes (Signed)
Given bus pass and wife given 2$ to ride buc

## 2013-04-12 ENCOUNTER — Encounter (HOSPITAL_COMMUNITY): Payer: Self-pay | Admitting: *Deleted

## 2013-04-12 ENCOUNTER — Emergency Department (HOSPITAL_COMMUNITY)
Admission: EM | Admit: 2013-04-12 | Discharge: 2013-04-12 | Discharge status: Against Medical Advice | Payer: Medicaid Other | Attending: Emergency Medicine | Admitting: Emergency Medicine

## 2013-04-12 DIAGNOSIS — R064 Hyperventilation: Secondary | ICD-10-CM | POA: Insufficient documentation

## 2013-04-12 DIAGNOSIS — F410 Panic disorder [episodic paroxysmal anxiety] without agoraphobia: Secondary | ICD-10-CM | POA: Insufficient documentation

## 2013-04-12 DIAGNOSIS — F17200 Nicotine dependence, unspecified, uncomplicated: Secondary | ICD-10-CM | POA: Insufficient documentation

## 2013-04-12 NOTE — ED Notes (Signed)
Pt not in waiting room

## 2013-04-12 NOTE — ED Notes (Signed)
Marijuana use 3 hours prior to arrival to help with anxiety

## 2013-04-12 NOTE — ED Notes (Signed)
Not in waiting area.  

## 2013-04-12 NOTE — ED Notes (Signed)
Per EMS pt c/o anxiety attack; history of same; calm and cooperative on arrival; hyperventilating on EMS arrival

## 2013-10-22 ENCOUNTER — Emergency Department (HOSPITAL_COMMUNITY)
Admission: EM | Admit: 2013-10-22 | Discharge: 2013-10-22 | Disposition: A | Discharge status: Home / Self Care | Payer: Medicaid Other | Attending: Emergency Medicine | Admitting: Emergency Medicine

## 2013-10-22 ENCOUNTER — Emergency Department (HOSPITAL_COMMUNITY): Payer: Medicaid Other

## 2013-10-22 ENCOUNTER — Encounter (HOSPITAL_COMMUNITY): Payer: Self-pay | Admitting: Emergency Medicine

## 2013-10-22 DIAGNOSIS — R509 Fever, unspecified: Secondary | ICD-10-CM | POA: Insufficient documentation

## 2013-10-22 DIAGNOSIS — R197 Diarrhea, unspecified: Secondary | ICD-10-CM | POA: Insufficient documentation

## 2013-10-22 DIAGNOSIS — R059 Cough, unspecified: Secondary | ICD-10-CM | POA: Insufficient documentation

## 2013-10-22 DIAGNOSIS — B9789 Other viral agents as the cause of diseases classified elsewhere: Secondary | ICD-10-CM | POA: Insufficient documentation

## 2013-10-22 DIAGNOSIS — R05 Cough: Secondary | ICD-10-CM | POA: Insufficient documentation

## 2013-10-22 DIAGNOSIS — J069 Acute upper respiratory infection, unspecified: Secondary | ICD-10-CM | POA: Insufficient documentation

## 2013-10-22 DIAGNOSIS — B349 Viral infection, unspecified: Secondary | ICD-10-CM | POA: Diagnosis present

## 2013-10-22 DIAGNOSIS — F17200 Nicotine dependence, unspecified, uncomplicated: Secondary | ICD-10-CM | POA: Insufficient documentation

## 2013-10-22 DIAGNOSIS — F319 Bipolar disorder, unspecified: Secondary | ICD-10-CM | POA: Insufficient documentation

## 2013-10-22 DIAGNOSIS — Z79899 Other long term (current) drug therapy: Secondary | ICD-10-CM | POA: Insufficient documentation

## 2013-10-22 LAB — BASIC METABOLIC PANEL
CO2: 23 mEq/L (ref 19–32)
Calcium: 9.3 mg/dL (ref 8.4–10.5)
Creatinine, Ser: 1.08 mg/dL (ref 0.50–1.35)
GFR calc Af Amer: >90 mL/min (ref >90)
GFR calc non Af Amer: 89 mL/min — ABNORMAL LOW (ref >90)

## 2013-10-22 LAB — CBC WITH DIFFERENTIAL/PLATELET
Basophils Absolute: 0 10*3/uL (ref 0.0–0.1)
Basophils Relative: 0 % (ref 0–1)
Eosinophils Absolute: 0 10*3/uL (ref 0.0–0.7)
Eosinophils Relative: 0 % (ref 0–5)
HCT: 47.5 % (ref 39.0–52.0)
MCH: 31.1 pg (ref 26.0–34.0)
MCHC: 34.1 g/dL (ref 30.0–36.0)
MCV: 91.2 fL (ref 78.0–100.0)
Monocytes Absolute: 1.3 10*3/uL — ABNORMAL HIGH (ref 0.1–1.0)
RDW: 14 % (ref 11.5–15.5)

## 2013-10-22 MED ORDER — ONDANSETRON 4 MG PO TBDP: ORAL_TABLET | ORAL | Status: DC

## 2013-10-22 MED ORDER — ACETAMINOPHEN 325 MG PO TABS
650.0000 mg | ORAL_TABLET | Freq: Once | ORAL | Status: AC
  Administered 2013-10-22: 650 mg via ORAL
  Filled 2013-10-22: qty 2

## 2013-10-22 MED ORDER — ONDANSETRON 4 MG PO TBDP
4.0000 mg | ORAL_TABLET | Freq: Once | ORAL | Status: AC
  Administered 2013-10-22: 4 mg via ORAL
  Filled 2013-10-22: qty 1

## 2013-10-22 NOTE — ED Notes (Signed)
Pt. Stated, it started a week ago with hot and cold sweats and now I can't eat anything.  Ive been running a fever.  Ive taken Tylenol about 1 hour ago

## 2013-10-22 NOTE — ED Provider Notes (Signed)
CSN: [id number]     Arrival date & time 10/22/13  1353 History   First MD Initiated Contact with Patient 10/22/13 1607     Chief Complaint  Patient presents with  . Generalized Body Aches   (Consider location/radiation/quality/duration/timing/severity/associated sxs/prior Treatment) Patient is a 32 y.o. male presenting with vomiting. The history is provided by the patient.  Emesis Severity:  Mild Duration:  1 week Timing:  Intermittent Quality:  Stomach contents Able to tolerate:  Liquids Progression:  Unchanged Chronicity:  New Recent urination:  Normal Relieved by:  Nothing Worsened by:  Nothing tried Ineffective treatments:  None tried Associated symptoms: chills, cough, diarrhea, fever and URI   Associated symptoms: no abdominal pain and no headaches     Past Medical History  Diagnosis Date  . Bipolar 1 disorder    History reviewed. No pertinent past surgical history. No family history on file. History  Substance Use Topics  . Smoking status: Current Every Day Smoker  . Smokeless tobacco: Not on file  . Alcohol Use: No    Review of Systems  Constitutional: Positive for chills. Negative for fever.  HENT: Negative for drooling and rhinorrhea.   Eyes: Negative for pain.  Respiratory: Positive for cough. Negative for shortness of breath.   Cardiovascular: Negative for chest pain and leg swelling.  Gastrointestinal: Positive for nausea, vomiting and diarrhea. Negative for abdominal pain.  Genitourinary: Negative for dysuria and hematuria.  Musculoskeletal: Negative for gait problem and neck pain.  Skin: Negative for color change.  Neurological: Negative for numbness and headaches.  Hematological: Negative for adenopathy.  Psychiatric/Behavioral: Negative for behavioral problems.  All other systems reviewed and are negative.    Allergies  Review of patient's allergies indicates no known allergies.  Home Medications   Current Outpatient Rx  Name  Route  Sig   Dispense  Refill  . ARIPiprazole (ABILIFY) 5 MG tablet   Oral   Take 5 mg by mouth daily.         Marland Kitchen ibuprofen (ADVIL,MOTRIN) 200 MG tablet   Oral   Take 400 mg by mouth every 6 (six) hours as needed. For pain         . traZODone (DESYREL) 100 MG tablet   Oral   Take 100 mg by mouth at bedtime.          BP 134/81  Pulse 75  Temp(Src) 98.9 F (37.2 C) (Oral)  Resp 16  SpO2 95% Physical Exam  Nursing note and vitals reviewed. Constitutional: He is oriented to person, place, and time. He appears well-developed and well-nourished.  HENT:  Head: Normocephalic and atraumatic.  Right Ear: External ear normal.  Left Ear: External ear normal.  Nose: Nose normal.  Mouth/Throat: Oropharynx is clear and moist. No oropharyngeal exudate.  Eyes: Conjunctivae and EOM are normal. Pupils are equal, round, and reactive to light.  Neck: Normal range of motion. Neck supple.  Cardiovascular: Normal rate, regular rhythm, normal heart sounds and intact distal pulses.  Exam reveals no gallop and no friction rub.   No murmur heard. Pulmonary/Chest: Effort normal and breath sounds normal. No respiratory distress. He has no wheezes.  Abdominal: Soft. Bowel sounds are normal. He exhibits no distension. There is no tenderness. There is no rebound and no guarding.  Musculoskeletal: Normal range of motion. He exhibits no edema and no tenderness.  Neurological: He is alert and oriented to person, place, and time.  Skin: Skin is warm and dry.  Psychiatric: He has a normal  mood and affect. His behavior is normal.    ED Course  Procedures (including critical care time) Labs Review Labs Reviewed  CBC WITH DIFFERENTIAL - Abnormal; Notable for the following:    Platelets 115 (*)    Lymphocytes Relative 10 (*)    Monocytes Relative 15 (*)    Monocytes Absolute 1.3 (*)    All other components within normal limits  BASIC METABOLIC PANEL - Abnormal; Notable for the following:    Sodium 134 (*)    GFR  calc non Af Amer 89 (*)    All other components within normal limits   Imaging Review Dg Chest 2 View  10/22/2013   CLINICAL DATA:  Cough, chest congestion, fever, body aches  EXAM: CHEST  2 VIEW  COMPARISON:  08/07/2009  FINDINGS: Normal heart size, mediastinal contours, and pulmonary vascularity.  Lungs clear.  Bones unremarkable.  No pneumothorax.  IMPRESSION: Normal exam.   Electronically Signed   By: Ulyses Southward M.D.   On: 10/22/2013 17:22    EKG Interpretation   None       MDM   1. Viral syndrome    4:24 PM 32 y.o. male presents with generalized body aches, chills, vomiting, diarrhea for approximately one week. He believes he had a fever yesterday. He is afebrile and vital signs are unremarkable here. He is tolerating liquids well at home. Likely viral syndrome. He has had a cough as well, will get chest x-ray to rule out pneumonia. If negative will recommend symptomatic tx at home.   5:54 PM: CXR non-contrib.  I have discussed the diagnosis/risks/treatment options with the patient and believe the pt to be eligible for discharge home to follow-up with pcp as needed. We also discussed returning to the ED immediately if new or worsening sx occur. We discussed the sx which are most concerning (e.g., sob, cp, inability to tolerate po) that necessitate immediate return. Any new prescriptions provided to the patient are listed below.  New Prescriptions   ONDANSETRON (ZOFRAN ODT) 4 MG DISINTEGRATING TABLET    4mg  ODT q4 hours prn nausea/vomit     Junius Argyle, MD 10/23/13 1112

## 2013-10-22 NOTE — ED Notes (Signed)
Unable to locate patient for room x3 

## 2014-08-09 ENCOUNTER — Emergency Department (HOSPITAL_COMMUNITY)
Admission: EM | Admit: 2014-08-09 | Discharge: 2014-08-09 | Disposition: A | Discharge status: Home / Self Care | Payer: Medicaid Other | Attending: Emergency Medicine | Admitting: Emergency Medicine

## 2014-08-09 ENCOUNTER — Encounter (HOSPITAL_COMMUNITY): Payer: Self-pay | Admitting: Emergency Medicine

## 2014-08-09 DIAGNOSIS — Z79899 Other long term (current) drug therapy: Secondary | ICD-10-CM | POA: Diagnosis not present

## 2014-08-09 DIAGNOSIS — H578 Other specified disorders of eye and adnexa: Secondary | ICD-10-CM | POA: Diagnosis present

## 2014-08-09 DIAGNOSIS — H109 Unspecified conjunctivitis: Secondary | ICD-10-CM

## 2014-08-09 DIAGNOSIS — F319 Bipolar disorder, unspecified: Secondary | ICD-10-CM | POA: Diagnosis not present

## 2014-08-09 DIAGNOSIS — H1012 Acute atopic conjunctivitis, left eye: Secondary | ICD-10-CM | POA: Diagnosis not present

## 2014-08-09 DIAGNOSIS — Z720 Tobacco use: Secondary | ICD-10-CM | POA: Diagnosis not present

## 2014-08-09 LAB — COMPREHENSIVE METABOLIC PANEL
ALBUMIN: 4 g/dL (ref 3.5–5.2)
ALT: 29 U/L (ref 0–53)
AST: 21 U/L (ref 0–37)
Alkaline Phosphatase: 87 U/L (ref 39–117)
Anion gap: 12 (ref 5–15)
BUN: 11 mg/dL (ref 6–23)
CALCIUM: 9.3 mg/dL (ref 8.4–10.5)
CHLORIDE: 100 meq/L (ref 96–112)
CO2: 27 mEq/L (ref 19–32)
Creatinine, Ser: 1.06 mg/dL (ref 0.50–1.35)
GFR calc Af Amer: >90 mL/min (ref >90)
Glucose, Bld: 93 mg/dL (ref 70–99)
Potassium: 4.5 mEq/L (ref 3.7–5.3)
SODIUM: 139 meq/L (ref 137–147)
Total Bilirubin: <0.2 mg/dL — ABNORMAL LOW (ref 0.3–1.2)
Total Protein: 7.8 g/dL (ref 6.0–8.3)

## 2014-08-09 LAB — CBC WITH DIFFERENTIAL/PLATELET
Basophils Absolute: 0 10*3/uL (ref 0.0–0.1)
Basophils Relative: 0 % (ref 0–1)
EOS ABS: 0 10*3/uL (ref 0.0–0.7)
EOS PCT: 1 % (ref 0–5)
HEMATOCRIT: 46.3 % (ref 39.0–52.0)
Hemoglobin: 15.7 g/dL (ref 13.0–17.0)
LYMPHS ABS: 2.1 10*3/uL (ref 0.7–4.0)
LYMPHS PCT: 24 % (ref 12–46)
MCH: 30.3 pg (ref 26.0–34.0)
MCHC: 33.9 g/dL (ref 30.0–36.0)
MCV: 89.2 fL (ref 78.0–100.0)
MONO ABS: 1.3 10*3/uL — AB (ref 0.1–1.0)
MONOS PCT: 15 % — AB (ref 3–12)
Neutro Abs: 5.2 10*3/uL (ref 1.7–7.7)
Neutrophils Relative %: 60 % (ref 43–77)
Platelets: 167 10*3/uL (ref 150–400)
RBC: 5.19 MIL/uL (ref 4.22–5.81)
RDW: 13.6 % (ref 11.5–15.5)
WBC: 8.6 10*3/uL (ref 4.0–10.5)

## 2014-08-09 MED ORDER — FLUORESCEIN SODIUM 1 MG OP STRP
1.0000 | ORAL_STRIP | Freq: Once | OPHTHALMIC | Status: AC
  Administered 2014-08-09: 1 via OPHTHALMIC
  Filled 2014-08-09: qty 1

## 2014-08-09 MED ORDER — PROCHLORPERAZINE EDISYLATE 5 MG/ML IJ SOLN
10.0000 mg | Freq: Four times a day (QID) | INTRAMUSCULAR | Status: DC | PRN
  Administered 2014-08-09: 10 mg via INTRAVENOUS
  Filled 2014-08-09: qty 2

## 2014-08-09 MED ORDER — OXYCODONE-ACETAMINOPHEN 7.5-325 MG PO TABS: 1.0000 | ORAL_TABLET | ORAL | Status: DC | PRN

## 2014-08-09 MED ORDER — PROMETHAZINE HCL 25 MG/ML IJ SOLN
12.5000 mg | Freq: Once | INTRAMUSCULAR | Status: AC
  Administered 2014-08-09: 12.5 mg via INTRAVENOUS
  Filled 2014-08-09: qty 1

## 2014-08-09 MED ORDER — CIPROFLOXACIN HCL 0.3 % OP SOLN
2.0000 [drp] | Freq: Once | OPHTHALMIC | Status: AC
  Administered 2014-08-09: 2 [drp] via OPHTHALMIC
  Filled 2014-08-09: qty 2.5

## 2014-08-09 MED ORDER — TETRACAINE HCL 0.5 % OP SOLN
1.0000 [drp] | Freq: Once | OPHTHALMIC | Status: AC
  Administered 2014-08-09: 1 [drp] via OPHTHALMIC
  Filled 2014-08-09: qty 2

## 2014-08-09 NOTE — ED Notes (Signed)
Dr[facility] Radford PaxBeaton [facility]

## 2014-08-09 NOTE — ED Notes (Signed)
Pt transferred to FT room 4

## 2014-08-09 NOTE — ED Provider Notes (Addendum)
CSN: [id number]     Arrival date & time 08/09/14  1229 History   First MD Initiated Contact with Patient 08/09/14 1635     Chief Complaint  Patient presents with  . Eye Problem  . Emesis  . Headache      HPI  Pt reports "having an infection in his left eye that he thinks has spread to rest of his body." has redness, drainage, itching, burning and blurred vision to left eye for several days. Now also having n/v/d And migraine headache. No acute distress noted at triage. States that his eye was crusted from discharge this morning.       Past Medical History  Diagnosis Date  . Bipolar 1 disorder    History reviewed. No pertinent past surgical history. History reviewed. No pertinent family history. History  Substance Use Topics  . Smoking status: Current Every Day Smoker  . Smokeless tobacco: Not on file  . Alcohol Use: No    Review of Systems  Eyes: Positive for discharge, redness and itching.  All other systems reviewed and are negative.     Allergies  Review of patient's allergies indicates no known allergies.  Home Medications   Prior to Admission medications   Medication Sig Start Date End Date Taking? Authorizing Provider  ibuprofen (ADVIL,MOTRIN) 200 MG tablet Take 400 mg by mouth every 6 (six) hours as needed for moderate pain. For pain   Yes Historical Provider, MD  ARIPiprazole (ABILIFY) 5 MG tablet Take 5 mg by mouth daily.    Historical Provider, MD  ondansetron (ZOFRAN ODT) 4 MG disintegrating tablet 4mg  ODT q4 hours prn nausea/vomit 10/22/13   Purvis SheffieldForrest Harrison, MD  oxyCODONE-acetaminophen (PERCOCET) 7.5-325 MG per tablet Take 1 tablet by mouth every 4 (four) hours as needed for pain. 08/09/14   Nelia Shiobert L Alliya Marcon, MD  traZODone (DESYREL) 100 MG tablet Take 100 mg by mouth at bedtime.    Historical Provider, MD   BP 124/74  Pulse 72  Temp(Src) 99 F (37.2 C) (Oral)  Resp 13  Ht 5\' 11"  (1.803 m)  Wt 225 lb (102.059 kg)  BMI 31.39 kg/m2  SpO2 98% Physical  Exam  Nursing note and vitals reviewed. Constitutional: He is oriented to person, place, and time. He appears well-developed and well-nourished. No distress.  HENT:  Head: Normocephalic and atraumatic.  Eyes: EOM are normal. Pupils are equal, round, and reactive to light. Left eye exhibits discharge. Left eye exhibits no hordeolum. No foreign body present in the left eye. Right conjunctiva is not injected. Left conjunctiva is injected.  Fundoscopic exam:      The left eye shows red reflex.  Slit lamp exam:      The left eye shows no corneal abrasion, no corneal flare, no corneal ulcer, no foreign body, no hyphema, no hypopyon, no fluorescein uptake and no anterior chamber bulge.  No consensual pain with light shined in the right eye.  Neck: Normal range of motion.  Cardiovascular: Normal rate and intact distal pulses.   Pulmonary/Chest: No respiratory distress.  Abdominal: Normal appearance. He exhibits no distension.  Musculoskeletal: Normal range of motion.  Neurological: He is alert and oriented to person, place, and time. No cranial nerve deficit.  Skin: Skin is warm and dry. No rash noted.  Psychiatric: He has a normal mood and affect. His behavior is normal.    ED Course  Procedures (including critical care time)  Medications  tetracaine (PONTOCAINE) 0.5 % ophthalmic solution 1 drop (1 drop Left  Eye Given 08/09/14 1752)  fluorescein ophthalmic strip 1 strip (1 strip Left Eye Given 08/09/14 1753)  promethazine (PHENERGAN) injection 12.5 mg (12.5 mg Intravenous Given 08/09/14 1911)  ciprofloxacin (CILOXAN) 0.3 % ophthalmic solution 2 drop (2 drops Left Eye Given 08/09/14 1943)    Labs Review Labs Reviewed  COMPREHENSIVE METABOLIC PANEL - Abnormal; Notable for the following:    Total Bilirubin <0.2 (*)    All other components within normal limits  CBC WITH DIFFERENTIAL - Abnormal; Notable for the following:    Monocytes Relative 15 (*)    Monocytes Absolute 1.3 (*)    All other  components within normal limits    Imaging Review No results found.    At this time the history and exam consistent with bacterial conjunctivitis. Doubt iritis.  Will start on Cipro drops tonight and will bring back in 24 hours for recheck. Told him it was important to come back for recheck and he assured me he would.  He will return earlier if vision changes or if symptoms worsen. Vision checked prior to discharge.  MDM   Final diagnoses:  Conjunctivitis of left eye          Nelia Shiobert L Rastus Borton, MD 08/11/14 1044

## 2014-08-09 NOTE — Discharge Instructions (Signed)

## 2014-08-09 NOTE — ED Notes (Signed)
20/30 left eye, 20/20 right eye notified Dr. Radford PaxBeaton

## 2014-08-09 NOTE — ED Notes (Signed)
Pt reports "having an infection in his left eye that he thinks has spread to rest of his body." has redness, drainage, itching, burning and blurred vision to left eye for several days. Now also having n/v/d  And migraine headache. No acute distress noted at triage.

## 2014-08-09 NOTE — ED Notes (Signed)
Dr Beaton at bedside 

## 2015-05-28 ENCOUNTER — Encounter (HOSPITAL_COMMUNITY): Payer: Self-pay

## 2015-05-28 ENCOUNTER — Emergency Department (HOSPITAL_COMMUNITY)
Admission: EM | Admit: 2015-05-28 | Discharge: 2015-05-29 | Disposition: A | Discharge status: Home / Self Care | Payer: Medicaid Other | Attending: Emergency Medicine | Admitting: Emergency Medicine

## 2015-05-28 DIAGNOSIS — K0889 Other specified disorders of teeth and supporting structures: Secondary | ICD-10-CM

## 2015-05-28 DIAGNOSIS — F319 Bipolar disorder, unspecified: Secondary | ICD-10-CM | POA: Insufficient documentation

## 2015-05-28 DIAGNOSIS — Z720 Tobacco use: Secondary | ICD-10-CM | POA: Insufficient documentation

## 2015-05-28 DIAGNOSIS — Z79899 Other long term (current) drug therapy: Secondary | ICD-10-CM | POA: Diagnosis not present

## 2015-05-28 DIAGNOSIS — R109 Unspecified abdominal pain: Secondary | ICD-10-CM | POA: Insufficient documentation

## 2015-05-28 DIAGNOSIS — K088 Other specified disorders of teeth and supporting structures: Secondary | ICD-10-CM | POA: Insufficient documentation

## 2015-05-28 LAB — URINALYSIS, ROUTINE W REFLEX MICROSCOPIC
Glucose, UA: NEGATIVE mg/dL
Hgb urine dipstick: NEGATIVE
Ketones, ur: 15 mg/dL — AB
Leukocytes, UA: NEGATIVE
NITRITE: NEGATIVE
Protein, ur: NEGATIVE mg/dL
Specific Gravity, Urine: 1.045 — ABNORMAL HIGH (ref 1.005–1.030)
UROBILINOGEN UA: 1 mg/dL (ref 0.0–1.0)
pH: 6 (ref 5.0–8.0)

## 2015-05-28 LAB — COMPREHENSIVE METABOLIC PANEL
ALK PHOS: 81 U/L (ref 38–126)
ALT: 32 U/L (ref 17–63)
AST: 37 U/L (ref 15–41)
Albumin: 4.8 g/dL (ref 3.5–5.0)
Anion gap: 9 (ref 5–15)
BUN: 15 mg/dL (ref 6–20)
CO2: 27 mmol/L (ref 22–32)
CREATININE: 0.97 mg/dL (ref 0.61–1.24)
Calcium: 9.4 mg/dL (ref 8.9–10.3)
Chloride: 103 mmol/L (ref 101–111)
GFR calc Af Amer: >60 mL/min (ref >60)
GFR calc non Af Amer: >60 mL/min (ref >60)
Glucose, Bld: 122 mg/dL — ABNORMAL HIGH (ref 65–99)
Potassium: 3.7 mmol/L (ref 3.5–5.1)
Sodium: 139 mmol/L (ref 135–145)
Total Bilirubin: 1 mg/dL (ref 0.3–1.2)
Total Protein: 8.1 g/dL (ref 6.5–8.1)

## 2015-05-28 LAB — CBC
HCT: 47.2 % (ref 39.0–52.0)
Hemoglobin: 15.9 g/dL (ref 13.0–17.0)
MCH: 30.7 pg (ref 26.0–34.0)
MCHC: 33.7 g/dL (ref 30.0–36.0)
MCV: 91.1 fL (ref 78.0–100.0)
Platelets: 151 10*3/uL (ref 150–400)
RBC: 5.18 MIL/uL (ref 4.22–5.81)
RDW: 13.3 % (ref 11.5–15.5)
WBC: 15.9 10*3/uL — AB (ref 4.0–10.5)

## 2015-05-28 LAB — LIPASE, BLOOD: Lipase: 54 U/L — ABNORMAL HIGH (ref 22–51)

## 2015-05-28 MED ORDER — AMOXICILLIN-POT CLAVULANATE 875-125 MG PO TABS
1.0000 | ORAL_TABLET | Freq: Once | ORAL | Status: AC
  Administered 2015-05-28: 1 via ORAL
  Filled 2015-05-28: qty 1

## 2015-05-28 MED ORDER — HYDROCODONE-ACETAMINOPHEN 5-325 MG PO TABS
1.0000 | ORAL_TABLET | Freq: Once | ORAL | Status: AC
  Administered 2015-05-28: 1 via ORAL
  Filled 2015-05-28: qty 1

## 2015-05-28 NOTE — ED Notes (Signed)
Informed the pt that a urine specimen is needed. 

## 2015-05-28 NOTE — ED Notes (Signed)
Writer asked pt if he was able to supply a urine sample, pt sts he had just went to the bathroom, Clinical research associate informed pt that urine is needed

## 2015-05-28 NOTE — ED Notes (Signed)
Patient arrived via EMS with c/o dental pain.  He had two teeth removed last week.  Swelling noted to left jaw area.  Pain has increased since he ran out of Vicodin 2 days PTA.  He also c/o abdominal pain and nausea.  Denies vomiting/diarrhea.

## 2015-05-28 NOTE — ED Provider Notes (Signed)
CSN: [id number]     Arrival date & time 05/28/15  2107 History   None    Chief Complaint  Patient presents with  . Dental Pain  . Abdominal Pain   HPI  34 year old male presents today with dental pain. Patient reports that 10 days ago he had 2 molars removed on his right lower jaw. He reports that he had minimal pain following the procedure without, dictation. He notes that over the last over the last 4 days he is developed increased right lower jaw pain and swelling. Patient notes that he had amoxicillin pills left over from pre-procedure and took 3 of those today. He reports that he does not have any more Vicodin, and has been using Tylenol and ibuprofen without relief of symptoms. Patient denies fever, chills, nausea, vomiting, dizziness difficulty swallowing, swelling to any other aspect of the mouth, tongue, throat, no neck pain or stiffness. He reports contacting his dentist yesterday who inform them that he could follow-up in the office in 5 days for reevaluation; they do not prescribe him antibiotics or pain medication. Patient additionally notes that prior to arrival he had left abdominal pain, felt this pressure. He reports the pain has subsided by the time of my evaluation. Patient reports normal bowel movements.  Past Medical History  Diagnosis Date  . Bipolar 1 disorder    History reviewed. No pertinent past surgical history. No family history on file. History  Substance Use Topics  . Smoking status: Current Every Day Smoker  . Smokeless tobacco: Not on file  . Alcohol Use: No    Review of Systems  All other systems reviewed and are negative.   Allergies  Review of patient's allergies indicates no known allergies.  Home Medications   Prior to Admission medications   Medication Sig Start Date End Date Taking? Authorizing Provider  acetaminophen (TYLENOL) 325 MG tablet Take 650 mg by mouth every 6 (six) hours as needed (dental pain.).   Yes Historical Provider, MD   ARIPiprazole (ABILIFY) 5 MG tablet Take 5 mg by mouth daily.   Yes Historical Provider, MD  traZODone (DESYREL) 100 MG tablet Take 100 mg by mouth at bedtime.   Yes Historical Provider, MD  amoxicillin-clavulanate (AUGMENTIN) 875-125 MG per tablet Take 1 tablet by mouth every 12 (twelve) hours. 05/29/15   Eyvonne Mechanic, PA-C  HYDROcodone-acetaminophen (NORCO/VICODIN) 5-325 MG per tablet Take 1 tablet by mouth every 6 (six) hours as needed. 05/29/15   Eyvonne Mechanic, PA-C  ondansetron (ZOFRAN ODT) 4 MG disintegrating tablet  ODT q4 hours prn nausea/vomit Patient not taking: Reported on 05/28/2015 10/22/13   Purvis Sheffield, MD  oxyCODONE-acetaminophen (PERCOCET) 7.5-325 MG per tablet Take 1 tablet by mouth every 4 (four) hours as needed for pain. Patient not taking: Reported on 05/28/2015 08/09/14   Nelva Nay, MD   BP 111/66 mmHg  Pulse 71  Temp(Src) 98.4 F (36.9 C) (Oral)  Resp 18  Ht  (1.803 m)  Wt 220 lb (99.791 kg)  BMI 30.70 kg/m2  SpO2 98%   Physical Exam  Constitutional: He is oriented to person, place, and time. He appears well-developed and well-nourished.  HENT:  Head: Normocephalic and atraumatic.  Asymmetry of the jaw right greater than left, no signs of soft tissue infection on external exam. Patient is able to open and close his mouth without difficulty or pain. Handling secretions, no difficulty with breathing or speaking. Internal exam reveals tenderness to mid mandible on the right, firm area no signs of  obvious fluctuance or discharge, no erythema. Remainder of John and tender to palpation. Second and third molars removed, no signs of obvious infection. Tongue soft nontender full range of motion, floor of mouth soft nontender oropharynx clear no signs of infection.   Eyes: Conjunctivae are normal. Pupils are equal, round, and reactive to light. Right eye exhibits no discharge. Left eye exhibits no discharge. No scleral icterus.  Neck: Normal range of motion.  Neck supple. No JVD present. No tracheal deviation present. No thyromegaly present.  Cardiovascular: Normal rate and regular rhythm.  Exam reveals no gallop and no friction rub.   No murmur heard. Pulmonary/Chest: Effort normal and breath sounds normal. No stridor.  Abdominal: Soft. He exhibits no distension and no mass. There is no tenderness. There is no rebound and no guarding.  Musculoskeletal: Normal range of motion. He exhibits no edema or tenderness.  Neurological: He is alert and oriented to person, place, and time. Coordination normal.  Skin: Skin is warm and dry.  Psychiatric: He has a normal mood and affect. His behavior is normal. Judgment and thought content normal.  Nursing note and vitals reviewed.    ED Course  Procedures (including critical care time) Labs Review Labs Reviewed  LIPASE, BLOOD - Abnormal; Notable for the following:    Lipase 54 (*)    All other components within normal limits  COMPREHENSIVE METABOLIC PANEL - Abnormal; Notable for the following:    Glucose, Bld 122 (*)    All other components within normal limits  CBC - Abnormal; Notable for the following:    WBC 15.9 (*)    All other components within normal limits  URINALYSIS, ROUTINE W REFLEX MICROSCOPIC (NOT AT [facility]) - Abnormal; Notable for the following:    Color, Urine AMBER (*)    Specific Gravity, Urine 1.045 (*)    Bilirubin Urine SMALL (*)    Ketones, ur 15 (*)    All other components within normal limits    Imaging Review No results found.   EKG Interpretation None      MDM   Final diagnoses:  Pain, dental    Labs: Urinalysis, lipase, CMP, CBC- significant for WBC of 15.9  Imaging: None indicated  Consults:  Therapeutics: Amoxicillin, Vicodin  Discharge Meds:   Assessment/Plan: Patient presents with dental pain, no obvious fluctuance noted on exam amenable to I&D. Suspect early localized dental infection. Patient's vital signs reassuring. Patient has follow-up planned  with his dentist in 5 days, encouraged for him to contact them tomorrow for immediate follow-up. He'll be placed on oral antibiotics, pain medication, strict return precautions. Patient verbalizes understanding and agreement for today's plan and had no further questions or concerns.         Eyvonne Mechanic, PA-C 05/29/15 0004  Tomasita Crumble, MD 05/29/15 [phone]

## 2015-05-29 MED ORDER — HYDROCODONE-ACETAMINOPHEN 5-325 MG PO TABS: 1.0000 | ORAL_TABLET | Freq: Four times a day (QID) | ORAL | Status: DC | PRN

## 2015-05-29 MED ORDER — AMOXICILLIN-POT CLAVULANATE 875-125 MG PO TABS: 1.0000 | ORAL_TABLET | Freq: Two times a day (BID) | ORAL | Status: DC

## 2015-05-29 NOTE — Discharge Instructions (Signed)
Dental Pain A tooth ache may be caused by cavities (tooth decay). Cavities expose the nerve of the tooth to air and hot or cold temperatures. It may come from an infection or abscess (also called a boil or furuncle) around your tooth. It is also often caused by dental caries (tooth decay). This causes the pain you are having. DIAGNOSIS  Your caregiver can diagnose this problem by exam. TREATMENT   If caused by an infection, it may be treated with medications which kill germs (antibiotics) and pain medications as prescribed by your caregiver. Take medications as directed.  Only take over-the-counter or prescription medicines for pain, discomfort, or fever as directed by your caregiver.  Whether the tooth ache today is caused by infection or dental disease, you should see your dentist as soon as possible for further care. SEEK MEDICAL CARE IF: The exam and treatment you received today has been provided on an emergency basis only. This is not a substitute for complete medical or dental care. If your problem worsens or new problems (symptoms) appear, and you are unable to meet with your dentist, call or return to this location. SEEK IMMEDIATE MEDICAL CARE IF:   You have a fever.  You develop redness and swelling of your face, jaw, or neck.  You are unable to open your mouth.  You have severe pain uncontrolled by pain medicine. MAKE SURE YOU:   Understand these instructions.  Will watch your condition.  Will get help right away if you are not doing well or get worse. Document Released: 10/18/2005 Document Revised: 01/10/2012 Document Reviewed: 06/05/2008 [facility] Patient Information 2015 [place], [place]. This information is not intended to replace advice given to you by your health care provider. Make sure you discuss any questions you have with your health care provider.  Please use antibiotics and pain medication as directed. Please do not drink drive or operate while taking pain  medication. Please contact her dentist and follow-up immediately for further evaluation. Please return the emergency room if new or worsening signs or symptoms present. Please read attached information

## 2017-05-29 ENCOUNTER — Encounter (HOSPITAL_COMMUNITY): Payer: Self-pay

## 2017-05-29 ENCOUNTER — Emergency Department (HOSPITAL_COMMUNITY)
Admission: EM | Admit: 2017-05-29 | Discharge: 2017-05-29 | Disposition: A | Discharge status: Home / Self Care | Payer: Medicaid Other | Attending: Emergency Medicine | Admitting: Emergency Medicine

## 2017-05-29 ENCOUNTER — Emergency Department (HOSPITAL_COMMUNITY): Payer: Medicaid Other

## 2017-05-29 DIAGNOSIS — R42 Dizziness and giddiness: Secondary | ICD-10-CM | POA: Insufficient documentation

## 2017-05-29 DIAGNOSIS — F319 Bipolar disorder, unspecified: Secondary | ICD-10-CM | POA: Insufficient documentation

## 2017-05-29 DIAGNOSIS — Z79899 Other long term (current) drug therapy: Secondary | ICD-10-CM | POA: Insufficient documentation

## 2017-05-29 DIAGNOSIS — F17200 Nicotine dependence, unspecified, uncomplicated: Secondary | ICD-10-CM | POA: Diagnosis not present

## 2017-05-29 LAB — URINALYSIS, ROUTINE W REFLEX MICROSCOPIC
Bilirubin Urine: NEGATIVE
GLUCOSE, UA: NEGATIVE mg/dL
HGB URINE DIPSTICK: NEGATIVE
KETONES UR: NEGATIVE mg/dL
Leukocytes, UA: NEGATIVE
Nitrite: NEGATIVE
Protein, ur: NEGATIVE mg/dL
SPECIFIC GRAVITY, URINE: 1.023 (ref 1.005–1.030)
pH: 5 (ref 5.0–8.0)

## 2017-05-29 LAB — CBC
HCT: 50 % (ref 39.0–52.0)
HEMOGLOBIN: 16.7 g/dL (ref 13.0–17.0)
MCH: 28.7 pg (ref 26.0–34.0)
MCHC: 33.4 g/dL (ref 30.0–36.0)
MCV: 85.9 fL (ref 78.0–100.0)
Platelets: 138 10*3/uL — ABNORMAL LOW (ref 150–400)
RBC: 5.82 MIL/uL — ABNORMAL HIGH (ref 4.22–5.81)
RDW: 13.9 % (ref 11.5–15.5)
WBC: 10.3 10*3/uL (ref 4.0–10.5)

## 2017-05-29 LAB — BASIC METABOLIC PANEL
Anion gap: 8 (ref 5–15)
BUN: 10 mg/dL (ref 6–20)
CO2: 22 mmol/L (ref 22–32)
Calcium: 8.5 mg/dL — ABNORMAL LOW (ref 8.9–10.3)
Chloride: 110 mmol/L (ref 101–111)
Creatinine, Ser: 0.96 mg/dL (ref 0.61–1.24)
GFR calc Af Amer: >60 mL/min (ref >60)
GLUCOSE: 113 mg/dL — AB (ref 65–99)
POTASSIUM: 4 mmol/L (ref 3.5–5.1)
Sodium: 140 mmol/L (ref 135–145)

## 2017-05-29 LAB — ETHANOL

## 2017-05-29 LAB — RAPID URINE DRUG SCREEN, HOSP PERFORMED
AMPHETAMINES: NOT DETECTED
BENZODIAZEPINES: NOT DETECTED
Barbiturates: NOT DETECTED
COCAINE: NOT DETECTED
OPIATES: NOT DETECTED
Tetrahydrocannabinol: POSITIVE — AB

## 2017-05-29 LAB — I-STAT TROPONIN, ED: Troponin i, poc: 0 ng/mL (ref 0.00–0.08)

## 2017-05-29 MED ORDER — SODIUM CHLORIDE 0.9 % IV BOLUS (SEPSIS)
1000.0000 mL | Freq: Once | INTRAVENOUS | Status: AC
  Administered 2017-05-29: 1000 mL via INTRAVENOUS

## 2017-05-29 NOTE — ED Provider Notes (Signed)
[facility]-EMERGENCY DEPT Provider Note   CSN: [id number] Arrival date & time: 05/29/17  1503     History   Chief Complaint Chief Complaint  Patient presents with  . Dizziness  . Fatigue  . Hypertension    HPI ALIJA RIANO is a 36 y.o. male.  HPI   DEANNA BOEHLKE is a 36 y.o. male, with a history of bipolar, presenting to the ED with intermittent episodes of "jitteriness" for the last few days. States he has episodes that seem random where he begins to feel "jittery," lightheaded, and clammy that lasts 45-60 minutes at a time. Occurs 2-3 times a day and at rest or while walking. Sometimes improves with control of breathing. States he his girlfriend has a blood pressure cough at home that he has been using and states he thinks his symptoms are due to his blood pressure rising. Has been getting numbers in the 160/100 range. Sometimes the symptoms occur with change in position. States he gave plasma today and has an increase in the intensity of symptoms. Gives plasma twice a week, Wed and Sun. Drinks about 2-3 cans of soda a day. Denies use of energy drinks, supplements, or coffee. Denies alcohol or illicit drug use.   Denies cough, CP, SOB, N/V/C/D, abdominal pain, LOC/syncope, confusion, headache, neuro deficits, or any other complaints.    Past Medical History:  Diagnosis Date  . Bipolar 1 disorder [facility])     Patient Active Problem List   Diagnosis Date Noted  . Viral syndrome 10/22/2013    History reviewed. No pertinent surgical history.     Home Medications    Prior to Admission medications   Medication Sig Start Date End Date Taking? Authorizing Provider  acetaminophen (TYLENOL) 325 MG tablet Take 650 mg by mouth every 6 (six) hours as needed (dental pain.).    [provider]  amoxicillin-clavulanate (AUGMENTIN) 875-125 MG per tablet Take 1 tablet by mouth every 12 (twelve) hours. 05/29/15   Hedges, Tinnie Gens, PA-C  ARIPiprazole (ABILIFY) 5 MG tablet Take 5 mg by  mouth daily.    [provider]  HYDROcodone-acetaminophen (NORCO/VICODIN) 5-325 MG per tablet Take 1 tablet by mouth every 6 (six) hours as needed. 05/29/15   Hedges, Tinnie Gens, PA-C  ondansetron (ZOFRAN ODT) 4 MG disintegrating tablet 4mg  ODT q4 hours prn nausea/vomit Patient not taking: Reported on 05/28/2015 10/22/13   Purvis Sheffield, MD  oxyCODONE-acetaminophen (PERCOCET) 7.5-325 MG per tablet Take 1 tablet by mouth every 4 (four) hours as needed for pain. Patient not taking: Reported on 05/28/2015 08/09/14   Nelva Nay, MD  traZODone (DESYREL) 100 MG tablet Take 100 mg by mouth at bedtime.    [provider]    Family History No family history on file.  Social History Social History  Substance Use Topics  . Smoking status: Current Every Day Smoker  . Smokeless tobacco: Current User  . Alcohol use No     Allergies   Patient has no known allergies.   Review of Systems Review of Systems  Constitutional: Positive for diaphoresis and fatigue. Negative for chills and fever.  Respiratory: Negative for shortness of breath.   Cardiovascular: Negative for chest pain.  Gastrointestinal: Negative for abdominal pain, diarrhea, nausea and vomiting.  Musculoskeletal: Negative for back pain and neck pain.  Neurological: Positive for light-headedness. Negative for syncope, weakness, numbness and headaches.  All other systems reviewed and are negative.    Physical Exam Updated Vital Signs BP (!) 139/96   Pulse 69  Temp 98 F (36.7 C) (Oral)   Resp 11   Ht 5\' 11"  (1.803 m)   Wt 126.1 kg (278 lb)   SpO2 100%   BMI 38.77 kg/m   Physical Exam  Constitutional: He appears well-developed and well-nourished. No distress.  HENT:  Head: Normocephalic and atraumatic.  Eyes: Pupils are equal, round, and reactive to light. Conjunctivae and EOM are normal.  Neck: Normal range of motion. Neck supple.  Cardiovascular: Normal rate, regular rhythm, normal heart sounds  and intact distal pulses.   Pulmonary/Chest: Effort normal and breath sounds normal. No respiratory distress.  Abdominal: Soft. There is no tenderness. There is no guarding.  Musculoskeletal: He exhibits no edema.  Normal motor function intact in all extremities and spine. No midline spinal tenderness.   Lymphadenopathy:    He has no cervical adenopathy.  Neurological: He is alert.  No sensory deficits. Strength 5/5 in all extremities. No gait disturbance. Coordination intact including heel to shin and finger to nose. Cranial nerves III-XII grossly intact. No facial droop.   Skin: Skin is warm and dry. Capillary refill takes less than 2 seconds. He is not diaphoretic.  Psychiatric: He has a normal mood and affect. His behavior is normal.  Nursing note and vitals reviewed.    ED Treatments / Results  Labs (all labs ordered are listed, but only abnormal results are displayed) Labs Reviewed  BASIC METABOLIC PANEL - Abnormal; Notable for the following:       Result Value   Glucose, Bld 113 (*)    Calcium 8.5 (*)    All other components within normal limits  CBC - Abnormal; Notable for the following:    RBC 5.82 (*)    Platelets 138 (*)    All other components within normal limits  RAPID URINE DRUG SCREEN, HOSP PERFORMED - Abnormal; Notable for the following:    Tetrahydrocannabinol POSITIVE (*)    All other components within normal limits  URINALYSIS, ROUTINE W REFLEX MICROSCOPIC  ETHANOL  I-STAT TROPONIN, ED  CBG MONITORING, ED    EKG  EKG Interpretation  Date/Time:  Sunday May 29 2017 15:09:44 EDT Ventricular Rate:  80 PR Interval:    QRS Duration: 87 QT Interval:  356 QTC Calculation: 411 R Axis:   9 Text Interpretation:  Sinus rhythm Borderline T abnormalities, anterior leads No STEMI. Similar to prior.  Confirmed by Alona BeneLong, Joshua [phone]) on 05/29/2017 5:33:55 PM       Radiology Dg Chest 2 View  Result Date: 05/29/2017 CLINICAL DATA:  Dizziness and body aches for  2 days. EXAM: CHEST  2 VIEW COMPARISON:  October 22, 2013 FINDINGS: The heart size and mediastinal contours are within normal limits. Both lungs are clear. The visualized skeletal structures are unremarkable. IMPRESSION: No active cardiopulmonary disease. Electronically Signed   By: Gerome Samavid  Williams III M.D   On: 05/29/2017 17:19    Procedures Procedures (including critical care time)  Medications Ordered in ED Medications  sodium chloride 0.9 % bolus 1,000 mL (0 mLs Intravenous Stopped 05/29/17 1650)     Initial Impression / Assessment and Plan / ED Course  I have reviewed the triage vital signs and the nursing notes.  Pertinent labs & imaging results that were available during my care of the patient were reviewed by me and considered in my medical decision making (see chart for details).     Patient presents with complaint of episodes of feeling jittery and lightheaded. May be connected with his marijuana use. Low  suspicion for ACS. HEART score is 1, indicating low risk for a cardiac event. Wells criteria score is 0, indicating low risk for PE. PERC negative. Patient is nontoxic appearing, afebrile, not tachycardic, not tachypneic, not hypotensive, maintains SPO2 of 98-100% on room air, and is in no apparent distress. No recurrence of the patient's symptoms during his ED course. Laboratory results reassuring. PCP follow up. The patient was given instructions for home care as well as return precautions. Patient voices understanding of these instructions, accepts the plan, and is comfortable with discharge.  Vitals:   05/29/17 1603 05/29/17 1615 05/29/17 1630 05/29/17 1645  BP: (!) 142/95 130/83 127/86 (!) 139/96  Pulse: 79 72 70 69  Resp: 14 (!) 8 14 11   Temp: 98 F (36.7 C)     TempSrc: Oral     SpO2: 98% 98% 98% 100%  Weight:      Height:         Final Clinical Impressions(s) / ED Diagnoses   Final diagnoses:  Episodic lightheadedness    New Prescriptions Discharge  Medication List as of 05/29/2017  5:36 PM       Anselm PancoastJoy, [address] C, PA-C 05/29/17 2012    Margarita Grizzleay, Danielle, MD 06/04/17 [phone]

## 2017-05-29 NOTE — Discharge Instructions (Signed)
You may try refraining from marijuana use as this can sometimes cause the mentioned symptoms. Be sure to stay well hydrated by increasing your water intake.  Follow up with a primary care provider as soon as possible for any further management of this issue. You may require further lab testing such as thyroid tests that are not routinely done in the emergency room.  Return to the ED as needed.

## 2017-05-29 NOTE — ED Notes (Signed)
Pt to xray at this time.

## 2017-05-29 NOTE — ED Triage Notes (Signed)
Pt via EMS with dizziness, generalized body aches, and weakness x 2 days. Per EMS, pt was had returned home after giving plasma approx 90 minutes ago and his dizziness and weakness got worse. Pt has been donating plasma for approx 1 year. Pt reports nausea denies vomiting, SOB, CP. Facial symmetry noted. A&Ox4. 180/110, HR 90, RR 16, 97% on RA, CBG 124. Pt denies hx of HTN.

## 2017-06-16 ENCOUNTER — Encounter (INDEPENDENT_AMBULATORY_CARE_PROVIDER_SITE_OTHER): Payer: Self-pay | Admitting: Physician Assistant

## 2017-06-16 ENCOUNTER — Ambulatory Visit (INDEPENDENT_AMBULATORY_CARE_PROVIDER_SITE_OTHER): Payer: Medicaid Other | Admitting: Physician Assistant

## 2017-06-16 VITALS — BP 144/98 | HR 83 | Temp 98.3°F | Wt 273.8 lb

## 2017-06-16 DIAGNOSIS — F3162 Bipolar disorder, current episode mixed, moderate: Secondary | ICD-10-CM | POA: Diagnosis not present

## 2017-06-16 DIAGNOSIS — R739 Hyperglycemia, unspecified: Secondary | ICD-10-CM

## 2017-06-16 DIAGNOSIS — R251 Tremor, unspecified: Secondary | ICD-10-CM | POA: Diagnosis not present

## 2017-06-16 DIAGNOSIS — R42 Dizziness and giddiness: Secondary | ICD-10-CM | POA: Diagnosis not present

## 2017-06-16 DIAGNOSIS — I10 Essential (primary) hypertension: Secondary | ICD-10-CM

## 2017-06-16 DIAGNOSIS — F1290 Cannabis use, unspecified, uncomplicated: Secondary | ICD-10-CM | POA: Diagnosis not present

## 2017-06-16 DIAGNOSIS — F411 Generalized anxiety disorder: Secondary | ICD-10-CM | POA: Diagnosis not present

## 2017-06-16 DIAGNOSIS — G4700 Insomnia, unspecified: Secondary | ICD-10-CM | POA: Diagnosis not present

## 2017-06-16 LAB — POCT GLYCOSYLATED HEMOGLOBIN (HGB A1C): Hemoglobin A1C: 5.5

## 2017-06-16 MED ORDER — RISPERIDONE 2 MG PO TABS: 2.0000 mg | ORAL_TABLET | Freq: Every day | ORAL | 1 refills | Status: DC

## 2017-06-16 MED ORDER — CLONAZEPAM 0.5 MG PO TABS: 0.5000 mg | ORAL_TABLET | Freq: Every day | ORAL | 0 refills | Status: DC

## 2017-06-16 MED ORDER — PROPRANOLOL HCL 40 MG PO TABS: 40.0000 mg | ORAL_TABLET | Freq: Two times a day (BID) | ORAL | 1 refills | Status: DC

## 2017-06-16 NOTE — Patient Instructions (Signed)
Cannabis Use Disorder Cannabis use disorder is when using marijuana disrupts a person's daily life or causes health problems. This condition can be dangerous. The health problems this condition can cause include:  Long-lasting problems with thinking and learning. These can be permanent in young people.  Severe anxiety.  Paranoia.  Hallucinations.  Dangerously high blood pressure and heart rate.  Schizophrenia.  Breathing problems.  Problems with child development during and after pregnancy.  People with this condition are also more likely to use other drugs. What are the causes? This condition is caused by using marijuana too much over time. It is not caused by using it only once in a while. Many people with this condition use marijuana because it gives them a feeling of extreme pleasure or relaxation. What increases the risk? This condition is more likely to develop in:  Men.  People with a family history of cannabis use disorder.  People with mental health issues such as depression or post-traumatic stress disorder.  What are the signs or symptoms? Symptoms of this condition include:  Using greater amounts of marijuana than you want to, or using marijuana for longer than you want to.  Craving marijuana.  Spending a lot of time getting marijuana and using it or recovering from its effects.  Having problems at work, at school, at home, or in relationships because of marijuana use.  Giving up or cutting down on important life activities because of marijuana use.  Using marijuana at times when it is dangerous, such as while you are driving a car.  Needing more and more marijuana to get the same effect you want from the marijuana (building up a tolerance).  Physical problems, such as: ? A long-lasting cough. ? Bronchitis. ? Emphysema. ? Throat and lung cancer.  Mental problems, such as: ? Psychosis. ? Anxiety. ? Trouble sleeping.  Having symptoms of withdrawal  when you stop using marijuana. Symptom of withdrawal include: ? Irritability or anger. ? Anxiety or restlessness. ? Trouble sleeping. ? Loss of appetite or weight loss. ? Aches and pains. ? Shakiness, sweating, or chills.  How is this diagnosed? This condition is diagnosed with an assessment. During the assessment, your health care provider will ask about your marijuana use and about how it affects your life. You will be diagnosed with the condition if you have had at least two symptoms of this condition within a 12-month period. How severe the condition is depends on how many symptoms you have.  If you have two to three symptoms, your condition is mild.  If you have four to five symptoms, your condition is moderate.  If you have six or more symptoms, your condition is severe.  Your health care provider may perform a physical exam or do lab tests to see if you have physical problems resulting from marijuana use. Your health care provider may also screen for drug use and refer you to a mental health professional for evaluation. How is this treated? Treatment for this condition is usually provided by mental health professionals with training in substance use disorders. Your treatment may involve:  Counseling. This treatment is also called talk therapy. It is provided by substance use treatment counselors. A counselor can address the reasons you use marijuana and suggest ways to keep you from using it again. The goals of talk therapy are to: ? Find healthy activities to replace using marijuana. ? Identify and avoid the things that trigger your marijuana use. ? Help you learn how to handle   cravings.  Support groups. Support groups are led by people who have quit using marijuana. They provide emotional support, advice, and guidance.  Medicine. Medicine is used to treat mental health issues that trigger marijuana use or that result from it.  Follow these instructions at home:  Take  over-the-counter and prescription medicines only as told by your health care provider.  Check with your health care provider before starting any new medicines.  Keep all follow-up visits as told by your health care provider. This is important.  Work with Higher education careers adviser or group to develop tools to keep you from using marijuana again (relapsing).  Make healthy lifestyle choices, such as: ? Eating a healthy diet. ? Getting enough exercise. ? Improving your stress-management skills.  Learn daily living skills and work Psychologist, occupational. Where to find more information:  [employer] on Drug Abuse: motorcyclefax.com  Substance Abuse and Mental Health Services Administration: ktimeonline.com Contact a health care provider if:  You are not able to take your medicines as told.  Your symptoms get worse. Get help right away if:  You have serious thoughts about hurting yourself or others. If you ever feel like you may hurt yourself or others, or have thoughts about taking your own life, get help right away. You can go to your nearest emergency department or call:  Your local emergency services (911 in the [place]  A suicide crisis helpline, such as the [place] at 6578343480. This is open 24 hours a day.  This information is not intended to replace advice given to you by your health care provider. Make sure you discuss any questions you have with your health care provider. Document Released: 10/15/2000 Document Revised: 07/16/2016 Document Reviewed: 07/16/2016 Elsevier Interactive Patient Education  2018 [employer]. Bipolar 1 Disorder Bipolar 1 disorder is a mental health disorder in which a person has episodes of emotional highs (mania), and may also have episodes of emotional lows (depression) in addition to highs. Bipolar 1 disorder is different from other bipolar disorders because it involves extreme manic episodes. These episodes last at least one week or  involve symptoms that are so severe that hospitalization is needed to keep the person safe. What increases the risk? The cause of this condition is not known. However, certain factors make you more likely to have bipolar disorder, such as:  Having a family member with the disorder.  An imbalance of certain chemicals in the brain (neurotransmitters).  Stress, such as illness, financial problems, or a death.  Certain conditions that affect the brain or spinal cord (neurologic conditions).  Brain injury (trauma).  Having another mental health disorder, such as: ? Obsessive compulsive disorder. ? Schizophrenia.  What are the signs or symptoms? Symptoms of mania include:  Very high self-esteem or self-confidence.  Decreased need for sleep.  Unusual talkativeness or feeling a need to keep talking. Speech may be very fast. It may seem like you cannot stop talking.  Racing thoughts or constant talking, with quick shifts between topics that may or may not be related (flight of ideas).  Decreased ability to focus or concentrate.  Increased purposeful activity, such as work, studies, or social activity.  Increased nonproductive activity. This could be pacing, squirming and fidgeting, or finger and toe tapping.  Impulsive behavior and poor judgment. This may result in high-risk activities, such as having unprotected sex or spending a lot of money.  Symptoms of depression include:  Feeling sad, hopeless, or helpless.  Frequent or uncontrollable crying.  Lack of feeling or caring about anything.  Sleeping too much.  Moving more slowly than usual.  Not being able to enjoy things you used to enjoy.  Wanting to be alone all the time.  Feeling guilty or worthless.  Lack of energy or motivation.  Trouble concentrating or remembering.  Trouble making decisions.  Increased appetite.  Thoughts of death, or the desire to harm yourself.  Sometimes, you may have a mixed mood.  This means having symptoms of depression and mania. Stress can make symptoms worse. How is this diagnosed? To diagnose bipolar disorder, your health care provider may ask about your:  Emotional episodes.  Medical history.  Alcohol and drug use. This includes prescription medicines. Certain medical conditions and substances can cause symptoms that seem like bipolar disorder (secondary bipolar disorder).  How is this treated? Bipolar disorder is a long-term (chronic) illness. It is best controlled with ongoing (continuous) treatment rather than treatment only when symptoms occur. Treatment may include:  Medicine. Medicine can be prescribed by a provider who specializes in treating mental disorders (psychiatrist). ? Medicines called mood stabilizers are usually prescribed. ? If symptoms occur even while taking a mood stabilizer, other medicines may be added.  Psychotherapy. Some forms of talk therapy, such as cognitive-behavioral therapy (CBT), can provide support, education, and guidance.  Coping methods, such as journaling or relaxation exercises. These may include: ? Yoga. ? Meditation. ? Deep breathing.  Lifestyle changes, such as: ? Limiting alcohol and drug use. ? Exercising regularly. ? Getting plenty of sleep. ? Making healthy eating choices.  A combination of medicine, talk therapy, and coping methods is best. A procedure in which electricity is applied to the brain through the scalp (electroconvulsive therapy) may be used in cases of severe mania when medicine and psychotherapy work too slowly or do not work. Follow these instructions at home: Activity   Return to your normal activities as told by your health care provider.  Find activities that you enjoy, and make time to do them.  Exercise regularly as told by your health care provider. Lifestyle  Limit alcohol intake to no more than 1 drink a day for nonpregnant women and 2 drinks a day for men. One drink equals 12  oz of beer, 5 oz of wine, or 1 oz of hard liquor.  Follow a set schedule for eating and sleeping.  Eat a balanced diet that includes fresh fruits and vegetables, whole grains, low-fat dairy, and lean meat.  Get 7-8 hours of sleep each night. General instructions  Take over-the-counter and prescription medicines only as told by your health care provider.  Think about joining a support group. Your health care provider may be able to recommend a support group.  Talk with your family and loved ones about your treatment goals and how they can help.  Keep all follow-up visits as told by your health care provider. This is important. Where to find more information: For more information about bipolar disorder, visit the following websites:  [employer] on Mental Illness: www.nami.[place]: https://carter.com/  Contact a health care provider if:  Your symptoms get worse.  You have side effects from your medicine, and they get worse.  You have trouble sleeping.  You have trouble doing daily activities.  You feel unsafe in your surroundings.  You are dealing with substance abuse. Get help right away if:  You have new symptoms.  You have thoughts about harming yourself.  You self-harm. This  information is not intended to replace advice given to you by your health care provider. Make sure you discuss any questions you have with your health care provider. Document Released: 01/24/2001 Document Revised: 06/13/2016 Document Reviewed: 06/17/2016 Elsevier Interactive Patient Education  [employer]2018 Elsevier Inc.

## 2017-06-16 NOTE — Progress Notes (Signed)
Subjective:  Patient ID: [name], male    DOB: 06/21/1981  Age: 36 y.o. MRN: [id number]  CC: lightheadedness  HPI [name] is a 36 y.o. male with a bipolar 1 disorder presents with intermittent episodes of "jitteriness". Seen at ED on 05/29/17. Diagnosed with episodic lightheadedness and treated with 1000 mL of normal saline. Drug screen pos for marijuana, Glucose 113, Ca 8.5.  UA negative, troponin negative, ethanol negative, CBC unremarkable.    States he has episodes that seem random where he begins to feel "jittery," lightheaded, and clammy that lasts 45-60 minutes at a time. Occurs 2-3 times a day at rest or while walking. Sometimes improves with control of breathing. Attributed to having HTN "spikes". Usual BP around 160/100. Gives plasma twice a week, Wed and Sun. Drinks about 2-3 cans of soda a day. Denies use of energy drinks, supplements, or coffee. Denies alcohol use. Smoke marijuana.     Anxiety and Bipolar disorder has been uncontrolled for two and a half years. Was previously taking seroquel and clonazepam but stopped seroquel 2/2 excessive drowsiness. Has not been to a psychiatrist in two and a half years also. Feels anxious, racing thoughts, and insomnia. Has depression at times but is unable to identify frequency and length of depressive and manic symptoms.    Outpatient Medications Prior to Visit  Medication Sig Dispense Refill  . acetaminophen (TYLENOL) 325 MG tablet Take 650 mg by mouth every 6 (six) hours as needed (dental pain.).    Marland Kitchen ARIPiprazole (ABILIFY) 5 MG tablet Take 5 mg by mouth daily.    Marland Kitchen HYDROcodone-acetaminophen (NORCO/VICODIN) 5-325 MG per tablet Take 1 tablet by mouth every 6 (six) hours as needed. 20 tablet 0  . traZODone (DESYREL) 100 MG tablet Take 100 mg by mouth at bedtime.    Marland Kitchen amoxicillin-clavulanate (AUGMENTIN) 875-125 MG per tablet Take 1 tablet by mouth every 12 (twelve) hours. (Patient not taking: Reported on 06/16/2017) 14 tablet 0  .  ondansetron (ZOFRAN ODT) 4 MG disintegrating tablet 4mg  ODT q4 hours prn nausea/vomit (Patient not taking: Reported on 05/28/2015) 20 tablet 0  . oxyCODONE-acetaminophen (PERCOCET) 7.5-325 MG per tablet Take 1 tablet by mouth every 4 (four) hours as needed for pain. (Patient not taking: Reported on 05/28/2015) 15 tablet 0   No facility-administered medications prior to visit.      ROS Review of Systems  Constitutional: Negative for chills, fever and malaise/fatigue.  Eyes: Negative for blurred vision.  Respiratory: Negative for shortness of breath.   Cardiovascular: Negative for chest pain and palpitations.  Gastrointestinal: Negative for abdominal pain and nausea.  Genitourinary: Negative for dysuria and hematuria.  Musculoskeletal: Negative for joint pain and myalgias.  Skin: Negative for rash.  Neurological: Positive for dizziness. Negative for tingling and headaches.  Psychiatric/Behavioral: Negative for depression. The patient is nervous/anxious and has insomnia.     Objective:  BP (!) 150/101 (BP Location: Right Arm, Patient Position: Sitting, Cuff Size: Large)   Pulse 83   Temp 98.3 F (36.8 C) (Oral)   Wt 273 lb 12.8 oz (124.2 kg)   SpO2 94%   BMI 38.19 kg/m   BP/Weight 06/16/2017 05/29/2017 05/29/2015  Systolic BP 150 154 122  Diastolic BP 101 100 78  Wt. (Lbs) 273.8 278 -  BMI 38.19 38.77 -      Physical Exam  Constitutional: He is oriented to person, place, and time.  Obese, unkept, malodorous, NAD, polite  HENT:  Head: Normocephalic and atraumatic.  Eyes:  Pupils are equal, round, and reactive to light. Conjunctivae are normal. No scleral icterus.  Neck: Normal range of motion. Neck supple. No thyromegaly present.  Cardiovascular: Normal rate, regular rhythm and normal heart sounds.   Pulmonary/Chest: Effort normal and breath sounds normal.  Musculoskeletal: He exhibits no edema.  Neurological: He is alert and oriented to person, place, and time. No cranial  nerve deficit. Coordination normal.  No tremors  Skin: Skin is warm and dry. No rash noted. No erythema. No pallor.  Psychiatric: He has a normal mood and affect. His behavior is normal. Thought content normal.  Vitals reviewed.    Assessment & Plan:     1. Bipolar 1 disorder, mixed, moderate (HCC) - risperiDONE (RISPERDAL) 2 MG tablet; Take 1 tablet (2 mg total) by mouth at bedtime.  Dispense: 30 tablet; Refill: 1 - Ambulatory referral to Psychiatry  2. Generalized anxiety disorder - clonazePAM (KLONOPIN) 0.5 MG tablet; Take 1 tablet (0.5 mg total) by mouth at bedtime.  Dispense: 21 tablet; Refill: 0 - Ambulatory referral to Psychiatry - propranolol (INDERAL) 40 MG tablet; Take 1 tablet (40 mg total) by mouth 2 (two) times daily.  Dispense: 60 tablet; Refill: 1  3. Tremor - propranolol (INDERAL) 40 MG tablet; Take 1 tablet (40 mg total) by mouth 2 (two) times daily.  Dispense: 60 tablet; Refill: 1  4. Insomnia, unspecified type - clonazePAM (KLONOPIN) 0.5 MG tablet; Take 1 tablet (0.5 mg total) by mouth at bedtime.  Dispense: 21 tablet; Refill: 0  5. Hypertension, unspecified type - propranolol (INDERAL) 40 MG tablet; Take 1 tablet (40 mg total) by mouth 2 (two) times daily.  Dispense: 60 tablet; Refill: 1  6. Lightheadedness - Hepatic Function Panel - Advised to drink more water and to abstain from plasma donation for now  7. Hyperglycemia - A1c 5.5% in clinic today.  8. Marijuana use - Advised against use of marijuana due to physical and legal consequences.  * Will try to help control Bipolar disorder, review labs, control symptoms, and consider pheochromocytoma work up if conventional evaluation/treatment fails.   Meds ordered this encounter  Medications  . clonazePAM (KLONOPIN) 0.5 MG tablet    Sig: Take 1 tablet (0.5 mg total) by mouth at bedtime.    Dispense:  21 tablet    Refill:  0    Order Specific Question:   Supervising Provider    Answer:   Quentin AngstJEGEDE,  OLUGBEMIGA E [id number]  . risperiDONE (RISPERDAL) 2 MG tablet    Sig: Take 1 tablet (2 mg total) by mouth at bedtime.    Dispense:  30 tablet    Refill:  1    Order Specific Question:   Supervising Provider    Answer:   Quentin AngstJEGEDE, OLUGBEMIGA E [id number]  . propranolol (INDERAL) 40 MG tablet    Sig: Take 1 tablet (40 mg total) by mouth 2 (two) times daily.    Dispense:  60 tablet    Refill:  1    Order Specific Question:   Supervising Provider    Answer:   Quentin AngstJEGEDE, OLUGBEMIGA E [id number]]    Follow-up: Return in about 4 weeks (around 07/14/2017) for bipolar.   Loletta Specteroger David Braelynn Benning PA

## 2017-06-17 LAB — HEPATIC FUNCTION PANEL
ALBUMIN: 4.6 g/dL (ref 3.5–5.5)
ALT: 36 IU/L (ref 0–44)
AST: 23 IU/L (ref 0–40)
Alkaline Phosphatase: 75 IU/L (ref 39–117)
BILIRUBIN TOTAL: 0.3 mg/dL (ref 0.0–1.2)
BILIRUBIN, DIRECT: 0.09 mg/dL (ref 0.00–0.40)
TOTAL PROTEIN: 7 g/dL (ref 6.0–8.5)

## 2017-06-23 ENCOUNTER — Encounter (INDEPENDENT_AMBULATORY_CARE_PROVIDER_SITE_OTHER): Payer: Self-pay

## 2017-07-05 ENCOUNTER — Ambulatory Visit (INDEPENDENT_AMBULATORY_CARE_PROVIDER_SITE_OTHER): Payer: Medicaid Other | Admitting: Physician Assistant

## 2017-07-06 ENCOUNTER — Encounter (INDEPENDENT_AMBULATORY_CARE_PROVIDER_SITE_OTHER): Payer: Self-pay | Admitting: Physician Assistant

## 2017-07-06 ENCOUNTER — Ambulatory Visit (INDEPENDENT_AMBULATORY_CARE_PROVIDER_SITE_OTHER): Payer: Medicaid Other | Admitting: Physician Assistant

## 2017-07-06 VITALS — BP 133/86 | HR 77 | Temp 98.1°F | Resp 18 | Ht 71.0 in | Wt 283.0 lb

## 2017-07-06 DIAGNOSIS — I10 Essential (primary) hypertension: Secondary | ICD-10-CM

## 2017-07-06 DIAGNOSIS — G4700 Insomnia, unspecified: Secondary | ICD-10-CM | POA: Diagnosis not present

## 2017-07-06 DIAGNOSIS — F319 Bipolar disorder, unspecified: Secondary | ICD-10-CM | POA: Diagnosis not present

## 2017-07-06 DIAGNOSIS — F411 Generalized anxiety disorder: Secondary | ICD-10-CM | POA: Diagnosis not present

## 2017-07-06 DIAGNOSIS — R251 Tremor, unspecified: Secondary | ICD-10-CM | POA: Diagnosis not present

## 2017-07-06 DIAGNOSIS — F17200 Nicotine dependence, unspecified, uncomplicated: Secondary | ICD-10-CM

## 2017-07-06 MED ORDER — NICOTINE 21 MG/24HR TD PT24: 21.0000 mg | MEDICATED_PATCH | Freq: Every day | TRANSDERMAL | 2 refills | Status: DC

## 2017-07-06 MED ORDER — RISPERIDONE 2 MG PO TABS: 2.0000 mg | ORAL_TABLET | Freq: Every day | ORAL | 1 refills | Status: DC

## 2017-07-06 MED ORDER — PROPRANOLOL HCL 40 MG PO TABS: 40.0000 mg | ORAL_TABLET | Freq: Two times a day (BID) | ORAL | 1 refills | Status: DC

## 2017-07-06 MED ORDER — CLONAZEPAM 0.5 MG PO TABS: 0.5000 mg | ORAL_TABLET | Freq: Every day | ORAL | 0 refills | Status: DC

## 2017-07-06 NOTE — Progress Notes (Signed)
Subjective:  Patient ID: Curtis LassoBarry W Bernick, male    DOB: 01/12/1981  Age: 36 y.o. MRN: [id number]  CC: f/u  HPI [name] is a 36 y.o. male with a medical history of  Bipolar disorder presents to f/u on Bipolar disorder, GAD, Tremor, and Insomnia. In regards to Bipolar 1 disorder, he was prescribed risperidone 2 mg qhs x30 days 1 rf and referred to Pshychiatry on 06/16/17.  Says he is feeling much better with the medications prescribed. Girlfriend, whom is present during the interview, also states patient's behavior is more calm, tremor resolved, and is sleeping better. Smokes half pack a day and would like help quitting. Does not endorse CP, palpitations, SOB, HA, abdominal pain, f/c/n/v, rash, or GI/GU sxs.    Outpatient Medications Prior to Visit  Medication Sig Dispense Refill  . acetaminophen (TYLENOL) 325 MG tablet Take 650 mg by mouth every 6 (six) hours as needed (dental pain.).    Marland Kitchen. ARIPiprazole (ABILIFY) 5 MG tablet Take 5 mg by mouth daily.    . clonazePAM (KLONOPIN) 0.5 MG tablet Take 1 tablet (0.5 mg total) by mouth at bedtime. 21 tablet 0  . HYDROcodone-acetaminophen (NORCO/VICODIN) 5-325 MG per tablet Take 1 tablet by mouth every 6 (six) hours as needed. 20 tablet 0  . propranolol (INDERAL) 40 MG tablet Take 1 tablet (40 mg total) by mouth 2 (two) times daily. 60 tablet 1  . risperiDONE (RISPERDAL) 2 MG tablet Take 1 tablet (2 mg total) by mouth at bedtime. 30 tablet 1  . traZODone (DESYREL) 100 MG tablet Take 100 mg by mouth at bedtime.     No facility-administered medications prior to visit.      ROS Review of Systems  Constitutional: Negative for chills, fever and malaise/fatigue.  Eyes: Negative for blurred vision.  Respiratory: Negative for shortness of breath.   Cardiovascular: Negative for chest pain and palpitations.  Gastrointestinal: Negative for abdominal pain and nausea.  Genitourinary: Negative for dysuria and hematuria.  Musculoskeletal: Negative for joint  pain and myalgias.  Skin: Negative for rash.  Neurological: Negative for tingling and headaches.  Psychiatric/Behavioral: Negative for depression. The patient is not nervous/anxious.     Objective:  Resp 18   Ht 5\' 11"  (1.803 m)   Wt 283 lb (128.4 kg)   BMI 39.47 kg/m   BP/Weight 07/06/2017 06/16/2017 05/29/2017  Systolic BP - 144 154  Diastolic BP - 98 086100  Wt. (Lbs) 283 273.8 278  BMI 39.47 38.19 38.77      Physical Exam  Constitutional: He is oriented to person, place, and time.  Well developed, well nourished, NAD, polite  HENT:  Head: Normocephalic and atraumatic.  Eyes: Conjunctivae are normal. No scleral icterus.  Neck: Normal range of motion. Neck supple. No thyromegaly present.  Cardiovascular: Normal rate, regular rhythm and normal heart sounds.   Pulmonary/Chest: Effort normal and breath sounds normal.  Musculoskeletal: He exhibits no edema.  Neurological: He is alert and oriented to person, place, and time.  No tremor of the UE bilaterally  Skin: Skin is warm and dry. No rash noted. No erythema. No pallor.  Psychiatric: He has a normal mood and affect. His behavior is normal. Thought content normal.  Vitals reviewed.    Assessment & Plan:   1. Bipolar I disorder (HCC) - Refill isperiDONE (RISPERDAL) 2 MG tablet; Take 1 tablet (2 mg total) by mouth at bedtime.  Dispense: 90 tablet; Refill: 1  2. Generalized anxiety disorder - Refill clonazePAM (KLONOPIN)  0.5 MG tablet; Take 1 tablet (0.5 mg total) by mouth at bedtime.  Dispense: 30 tablet; Refill: 0 - Refill propranolol (INDERAL) 40 MG tablet; Take 1 tablet (40 mg total) by mouth 2 (two) times daily.  Dispense: 180 tablet; Refill: 1  3. Tremor - Refill propranolol (INDERAL) 40 MG tablet; Take 1 tablet (40 mg total) by mouth 2 (two) times daily.  Dispense: 180 tablet; Refill: 1  4. Insomnia, unspecified type - Refill clonazePAM (KLONOPIN) 0.5 MG tablet; Take 1 tablet (0.5 mg total) by mouth at bedtime.   Dispense: 30 tablet; Refill: 0  5. Hypertension, unspecified type - Refill propranolol (INDERAL) 40 MG tablet; Take 1 tablet (40 mg total) by mouth 2 (two) times daily.  Dispense: 180 tablet; Refill: 1  6. Tobacco use disorder - Begin nicotine (NICODERM CQ - DOSED IN MG/24 HOURS) 21 mg/24hr patch; Place 1 patch (21 mg total) onto the skin daily.  Dispense: 28 patch; Refill: 2   Meds ordered this encounter  Medications  . risperiDONE (RISPERDAL) 2 MG tablet    Sig: Take 1 tablet (2 mg total) by mouth at bedtime.    Dispense:  90 tablet    Refill:  1    Order Specific Question:   Supervising Provider    Answer:   Quentin Angst [id number]  . clonazePAM (KLONOPIN) 0.5 MG tablet    Sig: Take 1 tablet (0.5 mg total) by mouth at bedtime.    Dispense:  30 tablet    Refill:  0    Order Specific Question:   Supervising Provider    Answer:   Quentin Angst [id number]  . propranolol (INDERAL) 40 MG tablet    Sig: Take 1 tablet (40 mg total) by mouth 2 (two) times daily.    Dispense:  180 tablet    Refill:  1    Order Specific Question:   Supervising Provider    Answer:   Quentin Angst [id number]  . nicotine (NICODERM CQ - DOSED IN MG/24 HOURS) 21 mg/24hr patch    Sig: Place 1 patch (21 mg total) onto the skin daily.    Dispense:  28 patch    Refill:  2    Order Specific Question:   Supervising Provider    Answer:   Quentin Angst [id number]    Follow-up: Return in about 8 weeks (around 08/31/2017) for bipolar.   Loletta Specter PA

## 2017-07-06 NOTE — Patient Instructions (Signed)

## 2017-07-26 ENCOUNTER — Telehealth (INDEPENDENT_AMBULATORY_CARE_PROVIDER_SITE_OTHER): Payer: Self-pay | Admitting: Physician Assistant

## 2017-07-26 NOTE — Telephone Encounter (Signed)
FWD to PCP. Tempestt S Roberts, CMA  

## 2017-07-26 NOTE — Telephone Encounter (Signed)
PT called to request a letter form his pcp. That is BP is under control since he is taking medication, he need the letter since he trying to donate cl plasma, if you need to see him before you can wright the letter please call pt at [phone], please follow up

## 2017-08-08 ENCOUNTER — Ambulatory Visit (INDEPENDENT_AMBULATORY_CARE_PROVIDER_SITE_OTHER): Payer: Medicaid Other | Admitting: Physician Assistant

## 2017-08-08 ENCOUNTER — Encounter (INDEPENDENT_AMBULATORY_CARE_PROVIDER_SITE_OTHER): Payer: Self-pay | Admitting: Physician Assistant

## 2017-08-08 VITALS — BP 136/91 | HR 72 | Temp 97.9°F | Wt 287.0 lb

## 2017-08-08 DIAGNOSIS — F411 Generalized anxiety disorder: Secondary | ICD-10-CM | POA: Diagnosis not present

## 2017-08-08 DIAGNOSIS — I10 Essential (primary) hypertension: Secondary | ICD-10-CM

## 2017-08-08 DIAGNOSIS — R251 Tremor, unspecified: Secondary | ICD-10-CM

## 2017-08-08 DIAGNOSIS — F319 Bipolar disorder, unspecified: Secondary | ICD-10-CM | POA: Diagnosis not present

## 2017-08-08 DIAGNOSIS — G4700 Insomnia, unspecified: Secondary | ICD-10-CM | POA: Insufficient documentation

## 2017-08-08 DIAGNOSIS — F17200 Nicotine dependence, unspecified, uncomplicated: Secondary | ICD-10-CM

## 2017-08-08 HISTORY — DX: Essential (primary) hypertension: I10

## 2017-08-08 MED ORDER — LISINOPRIL 20 MG PO TABS: 20.0000 mg | ORAL_TABLET | Freq: Every day | ORAL | 3 refills | Status: DC

## 2017-08-08 MED ORDER — RISPERIDONE 1 MG PO TABS: 1.0000 mg | ORAL_TABLET | Freq: Every day | ORAL | 3 refills | Status: DC

## 2017-08-08 MED ORDER — HYDROXYZINE HCL 25 MG PO TABS: 25.0000 mg | ORAL_TABLET | Freq: Three times a day (TID) | ORAL | 0 refills | Status: DC | PRN

## 2017-08-08 NOTE — Progress Notes (Signed)
Subjective:  Patient ID: [name], male    DOB: [birth year]  Age: 36 y.o. MRN: [id number]  CC: nightmares and panic attacks  HPI [name] is a 36 y.o. male with a medical history of  Bipolar disorder presents to f/u on Bipolar disorder. He is feeling very stable and does not endorse depression or mania. However, he had three consecutive nights of nightmares. Girlfriend, whom is present during the interview, states he began to have nightmares after watching a horror movie. Patient says it is difficult for him to fall asleep because he is afraid of having another nightmare. Taking Risperidone and propranolol as directed. Does not take clonazepam as it is not effective. Has not picked up nicotine patches because he wants to finish his cigarette pack first.            Outpatient Medications Prior to Visit  Medication Sig Dispense Refill  . clonazePAM (KLONOPIN) 0.5 MG tablet Take 1 tablet (0.5 mg total) by mouth at bedtime. 30 tablet 0  . nicotine (NICODERM CQ - DOSED IN MG/24 HOURS) 21 mg/24hr patch Place 1 patch (21 mg total) onto the skin daily. 28 patch 2  . propranolol (INDERAL) 40 MG tablet Take 1 tablet (40 mg total) by mouth 2 (two) times daily. 180 tablet 1  . risperiDONE (RISPERDAL) 2 MG tablet Take 1 tablet (2 mg total) by mouth at bedtime. 90 tablet 1   No facility-administered medications prior to visit.      ROS Review of Systems  Constitutional: Negative for chills, fever and malaise/fatigue.  Eyes: Negative for blurred vision.  Respiratory: Negative for shortness of breath.   Cardiovascular: Negative for chest pain and palpitations.  Gastrointestinal: Negative for abdominal pain and nausea.  Genitourinary: Negative for dysuria and hematuria.  Musculoskeletal: Negative for joint pain and myalgias.  Skin: Negative for rash.  Neurological: Negative for tingling and headaches.  Psychiatric/Behavioral: Negative for depression. The patient has insomnia. The patient  is not nervous/anxious.        Nightmare    Objective:  BP (!) 136/91 (BP Location: Left Arm, Patient Position: Sitting, Cuff Size: Large)   Pulse 72   Temp 97.9 F (36.6 C) (Oral)   Wt 287 lb (130.2 kg)   SpO2 98%   BMI 40.03 kg/m   BP/Weight 08/08/2017 07/06/2017 06/16/2017  Systolic BP 136 133 144  Diastolic BP 91 86 98  Wt. (Lbs) 287 283 273.8  BMI 40.03 39.47 38.19      Physical Exam  Constitutional: He is oriented to person, place, and time.  Well developed, obese, NAD, polite  HENT:  Head: Normocephalic and atraumatic.  Eyes: No scleral icterus.  Pulmonary/Chest: Effort normal.  Musculoskeletal: He exhibits no edema.  Neurological: He is alert and oriented to person, place, and time. No cranial nerve deficit. Coordination normal.  Skin: Skin is warm and dry. No rash noted. No erythema. No pallor.  Psychiatric: He has a normal mood and affect. His behavior is normal. Thought content normal.  Vitals reviewed.    Assessment & Plan:    1. Bipolar I disorder (HCC) - Decrease isperiDONE (RISPERDAL) from  2 mg tablet to 1 mg tablet - Referral to psychiatry  2. Generalized anxiety disorder - Stop clonazePAM (KLONOPIN) 0.5 MG tablet; Take 1 tablet (0.5 mg total) by mouth at bedtime.  Dispense: 30 tablet; Refill: 0 - Begin Hydroxyzine 25 mg up to three times daily.  - Continue propranolol (INDERAL) 40 MG tablet; Take 1  tablet (40 mg total) by mouth 2 (two) times daily.  Dispense: 180 tablet; Refill: 1 - Referral to psychiatry  3. Tremor - Continue propranolol (INDERAL) 40 MG tablet; Take 1 tablet (40 mg total) by mouth 2 (two) times daily.  Dispense: 180 tablet; Refill: 1  4. Insomnia, unspecified type - Stop clonazePAM (KLONOPIN) 0.5 MG tablet; Take 1 tablet (0.5 mg total) by mouth at bedtime.  Dispense: 30 tablet; Refill: 0 - Begin hydroxyzine 25 mg up to three times daily.  5. Hypertension, unspecified type - Continue propranolol (INDERAL) 40 MG tablet; Take  1 tablet (40 mg total) by mouth 2 (two) times daily.  Dispense: 180 tablet; Refill: 1 - Begin Lisinopril 20 mg qday #90 3 refills  6. Tobacco use disorder - Fill nicotine (NICODERM CQ - DOSED IN MG/24 HOURS) 21 mg/24hr patch; Place 1 patch (21 mg total) onto the skin daily.  Dispense: 28 patch; Refill: 2

## 2017-08-08 NOTE — Patient Instructions (Signed)
Bipolar 1 Disorder Bipolar 1 disorder is a mental health disorder in which a person has episodes of emotional highs (mania), and may also have episodes of emotional lows (depression) in addition to highs. Bipolar 1 disorder is different from other bipolar disorders because it involves extreme manic episodes. These episodes last at least one week or involve symptoms that are so severe that hospitalization is needed to keep the person safe. What increases the risk? The cause of this condition is not known. However, certain factors make you more likely to have bipolar disorder, such as:  Having a family member with the disorder.  An imbalance of certain chemicals in the brain (neurotransmitters).  Stress, such as illness, financial problems, or a death.  Certain conditions that affect the brain or spinal cord (neurologic conditions).  Brain injury (trauma).  Having another mental health disorder, such as: ? Obsessive compulsive disorder. ? Schizophrenia.  What are the signs or symptoms? Symptoms of mania include:  Very high self-esteem or self-confidence.  Decreased need for sleep.  Unusual talkativeness or feeling a need to keep talking. Speech may be very fast. It may seem like you cannot stop talking.  Racing thoughts or constant talking, with quick shifts between topics that may or may not be related (flight of ideas).  Decreased ability to focus or concentrate.  Increased purposeful activity, such as work, studies, or social activity.  Increased nonproductive activity. This could be pacing, squirming and fidgeting, or finger and toe tapping.  Impulsive behavior and poor judgment. This may result in high-risk activities, such as having unprotected sex or spending a lot of money.  Symptoms of depression include:  Feeling sad, hopeless, or helpless.  Frequent or uncontrollable crying.  Lack of feeling or caring about anything.  Sleeping too much.  Moving more slowly  than usual.  Not being able to enjoy things you used to enjoy.  Wanting to be alone all the time.  Feeling guilty or worthless.  Lack of energy or motivation.  Trouble concentrating or remembering.  Trouble making decisions.  Increased appetite.  Thoughts of death, or the desire to harm yourself.  Sometimes, you may have a mixed mood. This means having symptoms of depression and mania. Stress can make symptoms worse. How is this diagnosed? To diagnose bipolar disorder, your health care provider may ask about your:  Emotional episodes.  Medical history.  Alcohol and drug use. This includes prescription medicines. Certain medical conditions and substances can cause symptoms that seem like bipolar disorder (secondary bipolar disorder).  How is this treated? Bipolar disorder is a long-term (chronic) illness. It is best controlled with ongoing (continuous) treatment rather than treatment only when symptoms occur. Treatment may include:  Medicine. Medicine can be prescribed by a provider who specializes in treating mental disorders (psychiatrist). ? Medicines called mood stabilizers are usually prescribed. ? If symptoms occur even while taking a mood stabilizer, other medicines may be added.  Psychotherapy. Some forms of talk therapy, such as cognitive-behavioral therapy (CBT), can provide support, education, and guidance.  Coping methods, such as journaling or relaxation exercises. These may include: ? Yoga. ? Meditation. ? Deep breathing.  Lifestyle changes, such as: ? Limiting alcohol and drug use. ? Exercising regularly. ? Getting plenty of sleep. ? Making healthy eating choices.  A combination of medicine, talk therapy, and coping methods is best. A procedure in which electricity is applied to the brain through the scalp (electroconvulsive therapy) may be used in cases of severe mania when medicine   and psychotherapy work too slowly or do not work. Follow these  instructions at home: Activity   Return to your normal activities as told by your health care provider.  Find activities that you enjoy, and make time to do them.  Exercise regularly as told by your health care provider. Lifestyle  Limit alcohol intake to no more than 1 drink a day for nonpregnant women and 2 drinks a day for men. One drink equals 12 oz of beer, 5 oz of wine, or 1 oz of hard liquor.  Follow a set schedule for eating and sleeping.  Eat a balanced diet that includes fresh fruits and vegetables, whole grains, low-fat dairy, and lean meat.  Get 7-8 hours of sleep each night. General instructions  Take over-the-counter and prescription medicines only as told by your health care provider.  Think about joining a support group. Your health care provider may be able to recommend a support group.  Talk with your family and loved ones about your treatment goals and how they can help.  Keep all follow-up visits as told by your health care provider. This is important. Where to find more information: For more information about bipolar disorder, visit the following websites:  National Alliance on Mental Illness: www.nami.org  [place]. National Institute of Mental Health: www.nimh.nih.gov  Contact a health care provider if:  Your symptoms get worse.  You have side effects from your medicine, and they get worse.  You have trouble sleeping.  You have trouble doing daily activities.  You feel unsafe in your surroundings.  You are dealing with substance abuse. Get help right away if:  You have new symptoms.  You have thoughts about harming yourself.  You self-harm. This information is not intended to replace advice given to you by your health care provider. Make sure you discuss any questions you have with your health care provider. Document Released: 01/24/2001 Document Revised: 06/13/2016 Document Reviewed: 06/17/2016 Elsevier Interactive Patient Education  2018  Elsevier Inc.  

## 2017-08-30 ENCOUNTER — Telehealth (INDEPENDENT_AMBULATORY_CARE_PROVIDER_SITE_OTHER): Payer: Self-pay

## 2017-08-30 ENCOUNTER — Other Ambulatory Visit (INDEPENDENT_AMBULATORY_CARE_PROVIDER_SITE_OTHER): Payer: Self-pay | Admitting: Physician Assistant

## 2017-08-30 DIAGNOSIS — F411 Generalized anxiety disorder: Secondary | ICD-10-CM

## 2017-08-30 MED ORDER — HYDROXYZINE HCL 25 MG PO TABS: 25.0000 mg | ORAL_TABLET | Freq: Three times a day (TID) | ORAL | 0 refills | Status: DC | PRN

## 2017-08-30 NOTE — Telephone Encounter (Signed)
Patient requesting refill of Hydroxyzine to be sent to [facility] on [address].

## 2017-08-30 NOTE — Telephone Encounter (Signed)
Sent refill of hydroxyzine to [facility] on [address]

## 2017-08-31 ENCOUNTER — Ambulatory Visit (INDEPENDENT_AMBULATORY_CARE_PROVIDER_SITE_OTHER): Payer: Medicaid Other | Admitting: Physician Assistant

## 2017-09-19 ENCOUNTER — Encounter (INDEPENDENT_AMBULATORY_CARE_PROVIDER_SITE_OTHER): Payer: Self-pay | Admitting: Physician Assistant

## 2017-09-19 ENCOUNTER — Ambulatory Visit (INDEPENDENT_AMBULATORY_CARE_PROVIDER_SITE_OTHER): Payer: Medicaid Other | Admitting: Physician Assistant

## 2017-09-19 ENCOUNTER — Other Ambulatory Visit: Payer: Self-pay

## 2017-09-19 VITALS — BP 128/89 | HR 78 | Temp 97.8°F | Wt 294.4 lb

## 2017-09-19 DIAGNOSIS — R251 Tremor, unspecified: Secondary | ICD-10-CM

## 2017-09-19 DIAGNOSIS — F17200 Nicotine dependence, unspecified, uncomplicated: Secondary | ICD-10-CM

## 2017-09-19 DIAGNOSIS — F411 Generalized anxiety disorder: Secondary | ICD-10-CM | POA: Diagnosis not present

## 2017-09-19 DIAGNOSIS — F319 Bipolar disorder, unspecified: Secondary | ICD-10-CM | POA: Diagnosis not present

## 2017-09-19 DIAGNOSIS — I10 Essential (primary) hypertension: Secondary | ICD-10-CM

## 2017-09-19 DIAGNOSIS — Z029 Encounter for administrative examinations, unspecified: Secondary | ICD-10-CM | POA: Diagnosis not present

## 2017-09-19 MED ORDER — LISINOPRIL 20 MG PO TABS: 20.0000 mg | ORAL_TABLET | Freq: Every day | ORAL | 3 refills | Status: DC

## 2017-09-19 MED ORDER — RISPERIDONE 1 MG PO TABS: 1.0000 mg | ORAL_TABLET | Freq: Every day | ORAL | 5 refills | Status: DC

## 2017-09-19 MED ORDER — NICOTINE 7 MG/24HR TD PT24: 7.0000 mg | MEDICATED_PATCH | Freq: Every day | TRANSDERMAL | 1 refills | Status: DC

## 2017-09-19 MED ORDER — NICOTINE 14 MG/24HR TD PT24: 14.0000 mg | MEDICATED_PATCH | Freq: Every day | TRANSDERMAL | 1 refills | Status: DC

## 2017-09-19 MED ORDER — PROPRANOLOL HCL 40 MG PO TABS: 40.0000 mg | ORAL_TABLET | Freq: Two times a day (BID) | ORAL | 3 refills | Status: DC

## 2017-09-19 MED ORDER — HYDROXYZINE HCL 25 MG PO TABS: 25.0000 mg | ORAL_TABLET | Freq: Three times a day (TID) | ORAL | 5 refills | Status: DC | PRN

## 2017-09-19 NOTE — Progress Notes (Signed)
Subjective:  Patient ID: Curtis LassoBarry W Rosensteel, male    DOB: 10/03/1981  Age: 36 y.o. MRN: [id number]  CC:  F/u bipolar, GAD, paperwork for plasma   HPI [name] is a 36 y.o. male with a medical history of bipolar 1 and hypertension. Had Risperidone decreased from 2mg  to 1 mg tablet due to nightmares. Says his nightmares have stopped, does not feel manic, does not feel depressed. Was referred to psychiatry but no one reached out to him.     His HTN, GAD and tremor are much better since taking propranolol and hydroxyzine. Reports occasional anxiety attack but is soon resolved with hydroxyzine. No side effects reported with medications. Requests refills.    Has a form given to him by his plasma donor center. Says they found his diastolic blood pressure to be elevated when at the plasma center. Needs clearance to be able to donate again. Patient thinks nurses at the plasma center are not placing the blood pressure cuff on him correctly. Does not endorse CP, palpitations, SOB, HA, abdominal pain, f/c/n/v, rash, swelling, or GI/GU sxs.     Outpatient Medications Prior to Visit  Medication Sig Dispense Refill  . hydrOXYzine (ATARAX/VISTARIL) 25 MG tablet Take 1 tablet (25 mg total) by mouth 3 (three) times daily as needed. 30 tablet 0  . lisinopril (PRINIVIL,ZESTRIL) 20 MG tablet Take 1 tablet (20 mg total) by mouth daily. 90 tablet 3  . nicotine (NICODERM CQ - DOSED IN MG/24 HOURS) 21 mg/24hr patch Place 1 patch (21 mg total) onto the skin daily. 28 patch 2  . propranolol (INDERAL) 40 MG tablet Take 1 tablet (40 mg total) by mouth 2 (two) times daily. 180 tablet 1  . risperiDONE (RISPERDAL) 1 MG tablet Take 1 tablet (1 mg total) by mouth at bedtime. 30 tablet 3   No facility-administered medications prior to visit.      ROS Review of Systems  Constitutional: Negative for chills, fever and malaise/fatigue.  Eyes: Negative for blurred vision.  Respiratory: Negative for shortness of breath.    Cardiovascular: Negative for chest pain and palpitations.  Gastrointestinal: Negative for abdominal pain and nausea.  Genitourinary: Negative for dysuria and hematuria.  Musculoskeletal: Negative for joint pain and myalgias.  Skin: Negative for rash.  Neurological: Negative for tingling and headaches.  Psychiatric/Behavioral: Negative for depression. The patient is nervous/anxious (occasional anxiety attack).     Objective:  BP 128/89 (BP Location: Right Arm, Patient Position: Sitting, Cuff Size: Large)   Pulse 78   Temp 97.8 F (36.6 C) (Oral)   Wt 294 lb 6.4 oz (133.5 kg)   SpO2 97%   BMI 41.06 kg/m   BP/Weight 09/19/2017 08/08/2017 07/06/2017  Systolic BP 128 136 133  Diastolic BP 89 91 86  Wt. (Lbs) 294.4 287 283  BMI 41.06 40.03 39.47      Physical Exam  Constitutional: He is oriented to person, place, and time.  Well developed, obese, malodorous, NAD, polite  HENT:  Head: Normocephalic and atraumatic.  Eyes: No scleral icterus.  Cardiovascular: Regular rhythm.  Pulmonary/Chest: Effort normal.  Neurological: He is alert and oriented to person, place, and time. No cranial nerve deficit. Coordination normal.  Skin: Skin is warm and dry. No rash noted. No erythema. No pallor.  Psychiatric: He has a normal mood and affect. His behavior is normal. Thought content normal.  Vitals reviewed.    Assessment & Plan:    1. Bipolar 1 disorder (HCC) - Refill risperiDONE (RISPERDAL) 1  MG tablet; Take 1 tablet (1 mg total) at bedtime by mouth.  Dispense: 30 tablet; Refill: 5 - Referral to psychiatry has been made. Psychiatry tried reaching out to him twice but his phone number was incorrect. Patient was given the number to psychiatry so he may set up appointment.  2. Hypertension, unspecified type - Refill propranolol (INDERAL) 40 MG tablet; Take 1 tablet (40 mg total) 2 (two) times daily by mouth.  Dispense: 180 tablet; Refill: 3 -Refill lisinopril (PRINIVIL,ZESTRIL) 20 MG  tablet; Take 1 tablet (20 mg total) daily by mouth.  Dispense: 90 tablet; Refill: 3  3. Generalized anxiety disorder -Refill propranolol (INDERAL) 40 MG tablet; Take 1 tablet (40 mg total) 2 (two) times daily by mouth.  Dispense: 180 tablet; Refill: 3 -Refill hydrOXYzine (ATARAX/VISTARIL) 25 MG tablet; Take 1 tablet (25 mg total) 3 (three) times daily as needed by mouth.  Dispense: 30 tablet; Refill: 5  4. Tremor -Refill propranolol (INDERAL) 40 MG tablet; Take 1 tablet (40 mg total) 2 (two) times daily by mouth.  Dispense: 180 tablet; Refill: 3  5. GAD (generalized anxiety disorder) - Refill Propranolol 40 mg - Hydroxyzine 25 mg TID  6. Tobacco use disorder - Stop Nicoderm 21 mg/24hr patch - Begin nicotine (NICODERM CQ) 14 mg/24hr patch; Place 1 patch (14 mg total) daily onto the skin.  Dispense: 28 patch; Refill: 1 - Begin nicotine (NICODERM CQ) 7 mg/24hr patch; Place 1 patch (7 mg total) daily onto the skin.  Dispense: 28 patch; Refill: 1 * I have explained to patient he is to use 14 mg patches and eventually decrease dosage to 7 mg patches. Patient voiced understanding.  7. Administrative encounter - Plasma center form filled, copies made for scanning.  Meds ordered this encounter  Medications  . risperiDONE (RISPERDAL) 1 MG tablet    Sig: Take 1 tablet (1 mg total) at bedtime by mouth.    Dispense:  30 tablet    Refill:  5    Order Specific Question:   Supervising Provider    Answer:   Quentin AngstJEGEDE, OLUGBEMIGA E [id number]  . propranolol (INDERAL) 40 MG tablet    Sig: Take 1 tablet (40 mg total) 2 (two) times daily by mouth.    Dispense:  180 tablet    Refill:  3    Order Specific Question:   Supervising Provider    Answer:   Quentin AngstJEGEDE, OLUGBEMIGA E [id number]  . lisinopril (PRINIVIL,ZESTRIL) 20 MG tablet    Sig: Take 1 tablet (20 mg total) daily by mouth.    Dispense:  90 tablet    Refill:  3    Order Specific Question:   Supervising Provider    Answer:   Quentin AngstJEGEDE, OLUGBEMIGA E  [id number]  . hydrOXYzine (ATARAX/VISTARIL) 25 MG tablet    Sig: Take 1 tablet (25 mg total) 3 (three) times daily as needed by mouth.    Dispense:  30 tablet    Refill:  5    Order Specific Question:   Supervising Provider    Answer:   Quentin AngstJEGEDE, OLUGBEMIGA E [id number]  . nicotine (NICODERM CQ) 14 mg/24hr patch    Sig: Place 1 patch (14 mg total) daily onto the skin.    Dispense:  28 patch    Refill:  1    Order Specific Question:   Supervising Provider    Answer:   Quentin AngstJEGEDE, OLUGBEMIGA E [id number]  . nicotine (NICODERM CQ) 7 mg/24hr patch    Sig: Place 1 patch (7 mg total) daily  onto the skin.    Dispense:  28 patch    Refill:  1    Order Specific Question:   Supervising Provider    Answer:   Quentin Angst [[id number]]    Follow-up: Return in about 4 weeks (around 10/17/2017) for annual physical.   Loletta Specter PA

## 2017-09-19 NOTE — Patient Instructions (Signed)
Nicotine skin patches What is this medicine? NICOTINE (NIK [place] teen) helps people stop smoking. The patches replace the nicotine found in cigarettes and help to decrease withdrawal effects. They are most effective when used in combination with a stop-smoking program. This medicine may be used for other purposes; ask your health care provider or pharmacist if you have questions. COMMON BRAND NAME(S): Habitrol, Nicoderm CQ, Nicotrol What should I tell my health care provider before I take this medicine? They need to know if you have any of these conditions: -diabetes -heart disease, angina, irregular heartbeat or previous heart attack -high blood pressure -lung disease, including asthma -overactive thyroid -pheochromocytoma -seizures or a history of seizures -skin problems, like eczema -stomach problems or ulcers -an unusual or allergic reaction to nicotine, adhesives, other medicines, foods, dyes, or preservatives -pregnant or trying to get pregnant -breast-feeding How should I use this medicine? This medicine is for use on the skin. Follow the directions that come with the patches. Find an area of skin on your upper arm, chest, or back that is clean, dry, greaseless, undamaged and hairless. Wash hands with plain soap and water. Do not use anything that contains aloe, lanolin or glycerin as these may prevent the patch from sticking. Dry thoroughly. Remove the patch from the sealed pouch. Do not try to cut or trim the patch. Using your palm, press the patch firmly in place for 10 seconds to make sure that there is good contact with your skin. After applying the patch, wash your hands. Change the patch every day, keeping to a regular schedule. When you apply a new patch, use a new area of skin. Wait at least 1 week before using the same area again. Talk to your pediatrician regarding the use of this medicine in children. Special care may be needed. Overdosage: If you think you have taken too much  of this medicine contact a poison control center or emergency room at once. NOTE: This medicine is only for you. Do not share this medicine with others. What if I miss a dose? If you forget to replace a patch, use it as soon as you can. Only use one patch at a time and do not leave on the skin for longer than directed. If a patch falls off, you can replace it, but keep to your schedule and remove the patch at the right time. What may interact with this medicine? -medicines for asthma -medicines for blood pressure -medicines for mental depression This list may not describe all possible interactions. Give your health care provider a list of all the medicines, herbs, non-prescription drugs, or dietary supplements you use. Also tell them if you smoke, drink alcohol, or use illegal drugs. Some items may interact with your medicine. What should I watch for while using this medicine? You should begin using the nicotine patch the day you stop smoking. It is okay if you do not succeed at your attempt to quit and have a cigarette. You can still continue your quit attempt and keep using the product as directed. Just throw away your cigarettes and get back to your quit plan. You can keep the patch in place during swimming, bathing, and showering. If your patch falls off during these activities, replace it. When you first apply the patch, your skin may itch or burn. This should go away soon. When you remove a patch, the skin may look red, but this should only last for a few days. Call your doctor or health care professional   if skin redness does not go away after 4 days, if your skin swells, or if you get a rash. If you are a diabetic and you quit smoking, the effects of insulin may be increased and you may need to reduce your insulin dose. Check with your doctor or health care professional about how you should adjust your insulin dose. If you are going to have a magnetic resonance imaging (MRI) procedure, tell your  MRI technician if you have this patch on your body. It must be removed before a MRI. What side effects may I notice from receiving this medicine? Side effects that you should report to your doctor or health care professional as soon as possible: -allergic reactions like skin rash, itching or hives, swelling of the face, lips, or tongue -breathing problems -changes in hearing -changes in vision -chest pain -cold sweats -confusion -fast, irregular heartbeat -feeling faint or lightheaded, falls -headache -increased saliva -skin redness that lasts more than 4 days -stomach pain -signs and symptoms of nicotine overdose like nausea; vomiting; dizziness; weakness; and rapid heartbeat Side effects that usually do not require medical attention (report to your doctor or health care professional if they continue or are bothersome): -diarrhea -dry mouth -hiccups -irritability -nervousness or restlessness -trouble sleeping or vivid dreams This list may not describe all possible side effects. Call your doctor for medical advice about side effects. You may report side effects to FDA at 1-800-FDA-1088. Where should I keep my medicine? Keep out of the reach of children. Store at room temperature between 20 and 25 degrees C (68 and 77 degrees F). Protect from heat and light. Store in manufacturers packaging until ready to use. Throw away unused medicine after the expiration date. When you remove a patch, fold with sticky sides together; put in an empty opened pouch and throw away. NOTE: This sheet is a summary. It may not cover all possible information. If you have questions about this medicine, talk to your doctor, pharmacist, or health care provider.  2018 Elsevier/Gold Standard (2014-09-16 15:46:21)  

## 2017-10-17 ENCOUNTER — Other Ambulatory Visit: Payer: Self-pay

## 2017-10-17 ENCOUNTER — Ambulatory Visit (INDEPENDENT_AMBULATORY_CARE_PROVIDER_SITE_OTHER): Payer: Medicaid Other | Admitting: Physician Assistant

## 2017-10-17 ENCOUNTER — Encounter (INDEPENDENT_AMBULATORY_CARE_PROVIDER_SITE_OTHER): Payer: Self-pay | Admitting: Physician Assistant

## 2017-10-17 VITALS — BP 121/84 | HR 71 | Temp 97.7°F | Wt 300.8 lb

## 2017-10-17 DIAGNOSIS — F319 Bipolar disorder, unspecified: Secondary | ICD-10-CM

## 2017-10-17 DIAGNOSIS — Z114 Encounter for screening for human immunodeficiency virus [HIV]: Secondary | ICD-10-CM

## 2017-10-17 DIAGNOSIS — Z23 Encounter for immunization: Secondary | ICD-10-CM | POA: Diagnosis not present

## 2017-10-17 DIAGNOSIS — Z0000 Encounter for general adult medical examination without abnormal findings: Secondary | ICD-10-CM | POA: Diagnosis not present

## 2017-10-17 DIAGNOSIS — Z6841 Body Mass Index (BMI) 40.0 and over, adult: Secondary | ICD-10-CM

## 2017-10-17 MED ORDER — RISPERIDONE 1 MG PO TABS: 1.0000 mg | ORAL_TABLET | Freq: Every day | ORAL | 5 refills | Status: DC

## 2017-10-17 NOTE — Patient Instructions (Signed)

## 2017-10-17 NOTE — Progress Notes (Signed)
Subjective:  Patient ID: [name], male    DOB: 08/04/1981  Age: 36 y.o. MRN: [id number]  CC: annual exam  HPI [name] is a 36 y.o. male with a medical history of bipolar 1 disorder and HTN presents for an annual physical exam. No complaints or symptoms. Taking Risperidone 1 mg daily as directed and reports no depression or mania.        Outpatient Medications Prior to Visit  Medication Sig Dispense Refill  . hydrOXYzine (ATARAX/VISTARIL) 25 MG tablet Take 1 tablet (25 mg total) 3 (three) times daily as needed by mouth. 30 tablet 5  . lisinopril (PRINIVIL,ZESTRIL) 20 MG tablet Take 1 tablet (20 mg total) daily by mouth. 90 tablet 3  . nicotine (NICODERM CQ) 14 mg/24hr patch Place 1 patch (14 mg total) daily onto the skin. 28 patch 1  . nicotine (NICODERM CQ) 7 mg/24hr patch Place 1 patch (7 mg total) daily onto the skin. 28 patch 1  . propranolol (INDERAL) 40 MG tablet Take 1 tablet (40 mg total) 2 (two) times daily by mouth. 180 tablet 3  . risperiDONE (RISPERDAL) 1 MG tablet Take 1 tablet (1 mg total) at bedtime by mouth. 30 tablet 5   No facility-administered medications prior to visit.      ROS Review of Systems  Constitutional: Negative for chills, fever and malaise/fatigue.  Eyes: Negative for blurred vision.  Respiratory: Negative for shortness of breath.   Cardiovascular: Negative for chest pain and palpitations.  Gastrointestinal: Negative for abdominal pain and nausea.  Genitourinary: Negative for dysuria and hematuria.  Musculoskeletal: Negative for joint pain and myalgias.  Skin: Negative for rash.  Neurological: Negative for tingling and headaches.  Psychiatric/Behavioral: Negative for depression. The patient is not nervous/anxious.     Objective:  BP 121/84 (BP Location: Left Arm, Patient Position: Sitting, Cuff Size: Large)   Pulse 71   Temp 97.7 F (36.5 C) (Oral)   Wt (!) 300 lb 12.8 oz (136.4 kg)   SpO2 99%   BMI 41.95 kg/m   BP/Weight  10/17/2017 09/19/2017 08/08/2017  Systolic BP 121 128 136  Diastolic BP 84 89 91  Wt. (Lbs) 300.8 294.4 287  BMI 41.95 41.06 40.03      Physical Exam  Constitutional: He is oriented to person, place, and time.  Well developed, obese, NAD, polite  HENT:  Head: Normocephalic and atraumatic.  Mouth/Throat: No oropharyngeal exudate.  Eyes: Conjunctivae and EOM are normal. Pupils are equal, round, and reactive to light. No scleral icterus.  Neck: Normal range of motion. Neck supple. No thyromegaly present.  Cardiovascular: Normal rate, regular rhythm and normal heart sounds. Exam reveals no gallop and no friction rub.  No murmur heard. Pulmonary/Chest: Effort normal and breath sounds normal. No respiratory distress. He has no wheezes. He has no rales.  Abdominal: Soft. Bowel sounds are normal. He exhibits no distension and no mass. There is no tenderness. There is no rebound and no guarding.  Musculoskeletal: He exhibits no edema.  Lymphadenopathy:    He has no cervical adenopathy.  Neurological: He is alert and oriented to person, place, and time. He has normal reflexes. No cranial nerve deficit. Coordination normal.  LEs, UEs, and back with full aROM, no tenderness  Skin: Skin is warm and dry. No rash noted. No erythema. No pallor.  Psychiatric: He has a normal mood and affect. His behavior is normal. Thought content normal.  Vitals reviewed.    Assessment & Plan:  1. Annual physical exam - CBC with Differential - Comprehensive metabolic panel - Lipid panel  2. Class 3 severe obesity without serious comorbidity with body mass index (BMI) of 40.0 to 44.9 in adult, unspecified obesity type (HCC) - TSH  3. Screening for HIV (human immunodeficiency virus) - HIV antibody  4. Need for prophylactic vaccination and inoculation against influenza - Patient declined.   5. Bipolar 1 disorder (HCC) - Refill risperiDONE (RISPERDAL) 1 MG tablet; Take 1 tablet (1 mg total) by mouth at  bedtime.  Dispense: 30 tablet; Refill: 5   Meds ordered this encounter  Medications  . risperiDONE (RISPERDAL) 1 MG tablet    Sig: Take 1 tablet (1 mg total) by mouth at bedtime.    Dispense:  30 tablet    Refill:  5    Order Specific Question:   Supervising Provider    Answer:   Quentin AngstJEGEDE, OLUGBEMIGA E [id number]]    Follow-up: Return in about 4 months (around 02/15/2018) for bipolar.   Loletta Specteroger David Astin Rape PA

## 2017-10-18 ENCOUNTER — Other Ambulatory Visit (INDEPENDENT_AMBULATORY_CARE_PROVIDER_SITE_OTHER): Payer: Self-pay | Admitting: Physician Assistant

## 2017-10-18 DIAGNOSIS — E7841 Elevated Lipoprotein(a): Secondary | ICD-10-CM

## 2017-10-18 LAB — LIPID PANEL
CHOLESTEROL TOTAL: 194 mg/dL (ref 100–199)
Chol/HDL Ratio: 5.2 ratio — ABNORMAL HIGH (ref 0.0–5.0)
HDL: 37 mg/dL — ABNORMAL LOW (ref >39)
LDL CALC: 136 mg/dL — AB (ref 0–99)
Triglycerides: 103 mg/dL (ref 0–149)
VLDL CHOLESTEROL CAL: 21 mg/dL (ref 5–40)

## 2017-10-18 LAB — CBC WITH DIFFERENTIAL/PLATELET
BASOS ABS: 0.1 10*3/uL (ref 0.0–0.2)
Basos: 1 %
EOS (ABSOLUTE): 1.1 10*3/uL — AB (ref 0.0–0.4)
Eos: 12 %
Hematocrit: 44.2 % (ref 37.5–51.0)
Hemoglobin: 14.9 g/dL (ref 13.0–17.7)
Immature Grans (Abs): 0 10*3/uL (ref 0.0–0.1)
Immature Granulocytes: 0 %
LYMPHS ABS: 2.2 10*3/uL (ref 0.7–3.1)
Lymphs: 24 %
MCH: 28.9 pg (ref 26.6–33.0)
MCHC: 33.7 g/dL (ref 31.5–35.7)
MCV: 86 fL (ref 79–97)
MONOCYTES: 11 %
MONOS ABS: 1 10*3/uL — AB (ref 0.1–0.9)
Neutrophils Absolute: 4.6 10*3/uL (ref 1.4–7.0)
Neutrophils: 52 %
Platelets: 172 10*3/uL (ref 150–379)
RBC: 5.15 x10E6/uL (ref 4.14–5.80)
RDW: 14.4 % (ref 12.3–15.4)
WBC: 9 10*3/uL (ref 3.4–10.8)

## 2017-10-18 LAB — COMPREHENSIVE METABOLIC PANEL
ALK PHOS: 64 IU/L (ref 39–117)
ALT: 33 IU/L (ref 0–44)
AST: 20 IU/L (ref 0–40)
Albumin/Globulin Ratio: 1.6 (ref 1.2–2.2)
Albumin: 3.9 g/dL (ref 3.5–5.5)
BUN/Creatinine Ratio: 16 (ref 9–20)
BUN: 15 mg/dL (ref 6–20)
Bilirubin Total: 0.3 mg/dL (ref 0.0–1.2)
CHLORIDE: 105 mmol/L (ref 96–106)
CO2: 25 mmol/L (ref 20–29)
CREATININE: 0.94 mg/dL (ref 0.76–1.27)
Calcium: 9.1 mg/dL (ref 8.7–10.2)
GFR calc Af Amer: 119 mL/min/{1.73_m2} (ref >59)
GFR calc non Af Amer: 103 mL/min/{1.73_m2} (ref >59)
GLUCOSE: 92 mg/dL (ref 65–99)
Globulin, Total: 2.4 g/dL (ref 1.5–4.5)
Potassium: 4.7 mmol/L (ref 3.5–5.2)
SODIUM: 140 mmol/L (ref 134–144)
Total Protein: 6.3 g/dL (ref 6.0–8.5)

## 2017-10-18 LAB — HIV ANTIBODY (ROUTINE TESTING W REFLEX): HIV Screen 4th Generation wRfx: NONREACTIVE

## 2017-10-18 LAB — TSH: TSH: 1.43 u[IU]/mL (ref 0.450–4.500)

## 2017-10-18 MED ORDER — LOVASTATIN 20 MG PO TABS
20.0000 mg | ORAL_TABLET | Freq: Every day | ORAL | 3 refills | Status: DC
Start: 2017-10-18 — End: 2019-01-10

## 2017-10-19 ENCOUNTER — Telehealth (INDEPENDENT_AMBULATORY_CARE_PROVIDER_SITE_OTHER): Payer: Self-pay

## 2017-10-19 NOTE — Telephone Encounter (Signed)
-----   Message from Loletta Specteroger David Gomez, PA-C sent at 10/18/2017  5:02 PM EST ----- Labs normal except for sign of possible allergy and of elevated cholesterol. I sent lovastatin to [facility].

## 2017-10-19 NOTE — Telephone Encounter (Signed)
Patients girlfriend is aware of lab results, they have picked up the Rx for lovastatin. She stated that the Pharmacist only gave 30 tablets as medicaid will not pay for 90 day supply for first time us of medication. Maryjean Mornempestt S Roberts, CMA

## 2017-12-27 ENCOUNTER — Telehealth (INDEPENDENT_AMBULATORY_CARE_PROVIDER_SITE_OTHER): Payer: Self-pay | Admitting: Physician Assistant

## 2017-12-27 NOTE — Telephone Encounter (Signed)
Pt called to request a refill for

## 2018-01-27 ENCOUNTER — Telehealth (INDEPENDENT_AMBULATORY_CARE_PROVIDER_SITE_OTHER): Payer: Self-pay | Admitting: Physician Assistant

## 2018-01-27 NOTE — Telephone Encounter (Signed)
FWD to PCP. [name] S [name], CMA  

## 2018-01-27 NOTE — Telephone Encounter (Signed)
Pt called to get a refill for hydrOXYzine (ATARAX/VISTARIL) 25 MG tablet Please sent it to [id number] - [place], [place] - [address] Please follow up

## 2018-01-30 ENCOUNTER — Other Ambulatory Visit (INDEPENDENT_AMBULATORY_CARE_PROVIDER_SITE_OTHER): Payer: Self-pay | Admitting: Physician Assistant

## 2018-01-30 DIAGNOSIS — F411 Generalized anxiety disorder: Secondary | ICD-10-CM

## 2018-01-30 MED ORDER — HYDROXYZINE HCL 25 MG PO TABS: 25.0000 mg | ORAL_TABLET | Freq: Two times a day (BID) | ORAL | 5 refills | Status: DC

## 2018-01-30 NOTE — Telephone Encounter (Signed)
Sent!

## 2018-02-15 ENCOUNTER — Ambulatory Visit (INDEPENDENT_AMBULATORY_CARE_PROVIDER_SITE_OTHER): Payer: Medicaid Other | Admitting: Physician Assistant

## 2018-02-20 ENCOUNTER — Encounter (INDEPENDENT_AMBULATORY_CARE_PROVIDER_SITE_OTHER): Payer: Self-pay | Admitting: Physician Assistant

## 2018-02-20 ENCOUNTER — Other Ambulatory Visit: Payer: Self-pay

## 2018-02-20 ENCOUNTER — Ambulatory Visit (INDEPENDENT_AMBULATORY_CARE_PROVIDER_SITE_OTHER): Payer: Medicaid Other | Admitting: Physician Assistant

## 2018-02-20 VITALS — BP 126/78 | HR 79 | Temp 97.9°F | Ht 71.0 in | Wt 314.6 lb

## 2018-02-20 DIAGNOSIS — F17200 Nicotine dependence, unspecified, uncomplicated: Secondary | ICD-10-CM

## 2018-02-20 DIAGNOSIS — F319 Bipolar disorder, unspecified: Secondary | ICD-10-CM | POA: Diagnosis not present

## 2018-02-20 DIAGNOSIS — F411 Generalized anxiety disorder: Secondary | ICD-10-CM

## 2018-02-20 MED ORDER — VARENICLINE TARTRATE 0.5 MG X 11 & 1 MG X 42 PO MISC: ORAL | 0 refills | Status: DC

## 2018-02-20 MED ORDER — HYDROXYZINE HCL 25 MG PO TABS: 25.0000 mg | ORAL_TABLET | Freq: Two times a day (BID) | ORAL | 5 refills | Status: DC

## 2018-02-20 NOTE — Progress Notes (Signed)
Subjective:  Patient ID: [name], male    DOB: 1980/12/16  Age: 37 y.o. MRN: [id number]  CC: f/u bipolar  HPI [name] is a 37 y.o. male with a medical history of bipolar 1 disorder, HTN, and tobacco abuse presents on bipolar disorder f/u. Currently feeling well on Risperidone 1 mg qday. Does not have depression or mania. Only concern is of anxiety attacks. Had approximately 5-6 anxiety attacks since his last visit four months ago. Can not identify any triggers. However, the amount of anxiety with hydroxyzine use has drastically reduced. Has been referred to psychiatry twice but he could not be reached by phone.     Patient is still smoking at least half a pack a day. Does not use nicotine patches regularly. Does not think the patches are "strong enough".    Outpatient Medications Prior to Visit  Medication Sig Dispense Refill  . hydrOXYzine (ATARAX/VISTARIL) 25 MG tablet Take 1 tablet (25 mg total) by mouth 2 (two) times daily. 60 tablet 5  . lisinopril (PRINIVIL,ZESTRIL) 20 MG tablet Take 1 tablet (20 mg total) daily by mouth. 90 tablet 3  . lovastatin (MEVACOR) 20 MG tablet Take 1 tablet (20 mg total) by mouth at bedtime. 90 tablet 3  . nicotine (NICODERM CQ) 7 mg/24hr patch Place 1 patch (7 mg total) daily onto the skin. 28 patch 1  . propranolol (INDERAL) 40 MG tablet Take 1 tablet (40 mg total) 2 (two) times daily by mouth. 180 tablet 3  . risperiDONE (RISPERDAL) 1 MG tablet Take 1 tablet (1 mg total) by mouth at bedtime. 30 tablet 5  . nicotine (NICODERM CQ) 14 mg/24hr patch Place 1 patch (14 mg total) daily onto the skin. (Patient not taking: Reported on 02/20/2018) 28 patch 1   No facility-administered medications prior to visit.      ROS Review of Systems  Constitutional: Negative for chills, fever and malaise/fatigue.  Eyes: Negative for blurred vision.  Respiratory: Negative for shortness of breath.   Cardiovascular: Negative for chest pain and palpitations.   Gastrointestinal: Negative for abdominal pain and nausea.  Genitourinary: Negative for dysuria and hematuria.  Musculoskeletal: Negative for joint pain and myalgias.  Skin: Negative for rash.  Neurological: Negative for tingling and headaches.  Psychiatric/Behavioral: Negative for depression. The patient is not nervous/anxious.     Objective:  BP 126/78 (BP Location: Right Arm, Patient Position: Sitting, Cuff Size: Large)   Pulse 79   Temp 97.9 F (36.6 C) (Oral)   Ht 5\' 11"  (1.803 m)   Wt (!) 314 lb 9.6 oz (142.7 kg)   SpO2 95%   BMI 43.88 kg/m   BP/Weight 02/20/2018 10/17/2017 09/19/2017  Systolic BP 126 121 128  Diastolic BP 78 84 89  Wt. (Lbs) 314.6 300.8 294.4  BMI 43.88 41.95 41.06      Physical Exam  Constitutional: He is oriented to person, place, and time.  Well developed, well nourished, NAD, polite  HENT:  Head: Normocephalic and atraumatic.  Eyes: No scleral icterus.  Neck: Normal range of motion. Neck supple. No thyromegaly present.  Cardiovascular: Normal rate, regular rhythm and normal heart sounds.  Pulmonary/Chest: Effort normal and breath sounds normal.  Musculoskeletal: He exhibits no edema.  Neurological: He is alert and oriented to person, place, and time.  Skin: Skin is warm and dry. No rash noted. No erythema. No pallor.  Psychiatric: He has a normal mood and affect. His behavior is normal. Thought content normal.  Vitals reviewed.  Assessment & Plan:    1. Bipolar I disorder (HCC) - Ambulatory referral to Psychiatry  2. Generalized anxiety disorder - hydrOXYzine (ATARAX/VISTARIL) 25 MG tablet; Take 1 tablet (25 mg total) by mouth 2 (two) times daily.  Dispense: 60 tablet; Refill: 5  3. Tobacco use disorder - varenicline (CHANTIX STARTING MONTH PAK) 0.5 MG X 11 & 1 MG X 42 tablet; Take one 0.5 mg tablet by mouth once daily for 3 days, then increase to one 0.5 mg tablet twice daily for 4 days, then increase to one 1 mg tablet twice  daily.  Dispense: 53 tablet; Refill: 0   Meds ordered this encounter  Medications  . hydrOXYzine (ATARAX/VISTARIL) 25 MG tablet    Sig: Take 1 tablet (25 mg total) by mouth 2 (two) times daily.    Dispense:  60 tablet    Refill:  5    Order Specific Question:   Supervising Provider    Answer:   Quentin AngstJEGEDE, OLUGBEMIGA E [id number]]  . varenicline (CHANTIX STARTING MONTH PAK) 0.5 MG X 11 & 1 MG X 42 tablet    Sig: Take one 0.5 mg tablet by mouth once daily for 3 days, then increase to one 0.5 mg tablet twice daily for 4 days, then increase to one 1 mg tablet twice daily.    Dispense:  53 tablet    Refill:  0    Order Specific Question:   Supervising Provider    Answer:   Quentin AngstJEGEDE, OLUGBEMIGA E [id number]]    Follow-up: Return in about 1 month (around 03/20/2018) for tobacco use.   Loletta Specteroger David Gomez PA

## 2018-02-20 NOTE — Patient Instructions (Signed)

## 2018-04-03 ENCOUNTER — Ambulatory Visit (INDEPENDENT_AMBULATORY_CARE_PROVIDER_SITE_OTHER): Payer: Medicaid Other | Admitting: Physician Assistant

## 2018-04-03 ENCOUNTER — Encounter (INDEPENDENT_AMBULATORY_CARE_PROVIDER_SITE_OTHER): Payer: Self-pay | Admitting: Physician Assistant

## 2018-04-03 ENCOUNTER — Other Ambulatory Visit: Payer: Self-pay

## 2018-04-03 VITALS — BP 123/89 | HR 76 | Temp 97.7°F | Ht 71.0 in | Wt 325.0 lb

## 2018-04-03 DIAGNOSIS — F319 Bipolar disorder, unspecified: Secondary | ICD-10-CM

## 2018-04-03 DIAGNOSIS — F17200 Nicotine dependence, unspecified, uncomplicated: Secondary | ICD-10-CM | POA: Diagnosis not present

## 2018-04-03 DIAGNOSIS — K219 Gastro-esophageal reflux disease without esophagitis: Secondary | ICD-10-CM | POA: Diagnosis not present

## 2018-04-03 MED ORDER — VARENICLINE TARTRATE 0.5 MG PO TABS: 0.5000 mg | ORAL_TABLET | Freq: Two times a day (BID) | ORAL | 5 refills | Status: DC

## 2018-04-03 MED ORDER — OMEPRAZOLE 40 MG PO CPDR: 40.0000 mg | DELAYED_RELEASE_CAPSULE | Freq: Every day | ORAL | 5 refills | Status: DC

## 2018-04-03 NOTE — Patient Instructions (Signed)
Food Choices for Gastroesophageal Reflux Disease, Adult When you have gastroesophageal reflux disease (GERD), the foods you eat and your eating habits are very important. Choosing the right foods can help ease your discomfort. What guidelines do I need to follow?  Choose fruits, vegetables, whole grains, and low-fat dairy products.  Choose low-fat meat, fish, and poultry.  Limit fats such as oils, salad dressings, butter, nuts, and avocado.  Keep a food diary. This helps you identify foods that cause symptoms.  Avoid foods that cause symptoms. These may be different for everyone.  Eat small meals often instead of 3 large meals a day.  Eat your meals slowly, in a place where you are relaxed.  Limit fried foods.  Cook foods using methods other than frying.  Avoid drinking alcohol.  Avoid drinking large amounts of liquids with your meals.  Avoid bending over or lying down until 2-3 hours after eating. What foods are not recommended? These are some foods and drinks that may make your symptoms worse: Vegetables  Tomatoes. Tomato juice. Tomato and spaghetti sauce. Chili peppers. Onion and garlic. Horseradish. Fruits  Oranges, grapefruit, and lemon (fruit and juice). Meats  High-fat meats, fish, and poultry. This includes hot dogs, ribs, ham, sausage, salami, and bacon. Dairy  Whole milk and chocolate milk. Sour cream. Cream. Butter. Ice cream. Cream cheese. Drinks  Coffee and tea. Bubbly (carbonated) drinks or energy drinks. Condiments  Hot sauce. Barbecue sauce. Sweets/Desserts  Chocolate and cocoa. Donuts. Peppermint and spearmint. Fats and Oils  High-fat foods. This includes French fries and potato chips. Other  Vinegar. Strong spices. This includes black pepper, white pepper, red pepper, cayenne, curry powder, cloves, ginger, and chili powder. The items listed above may not be a complete list of foods and drinks to avoid. Contact your dietitian for more information.    This information is not intended to replace advice given to you by your health care provider. Make sure you discuss any questions you have with your health care provider. Document Released: 04/18/2012 Document Revised: 03/25/2016 Document Reviewed: 08/22/2013 Elsevier Interactive Patient Education  2017 Elsevier Inc.  

## 2018-04-03 NOTE — Progress Notes (Signed)
Pt complains of heat burn would like to discuss medication for relief.

## 2018-04-03 NOTE — Progress Notes (Signed)
Subjective:  Patient ID: [name], male    DOB: Mar 10, 1981  Age: 37 y.o. MRN: [id number]  CC: f/u tobacco use, heartburn, bipolar  HPI  [name] a 37 y.o.malewith a medical history of bipolar 1 disorder, HTN, and tobacco abuse presents on bipolar disorder f/u. Tobacco use has decreased from half a pack a day to "four cigarettes a day" with the use of Chantix.     Has acid reflux that is keeping him from sleeping. Spicy foods, caffeine, and tomato sauce make worse. Reflux once or twice a day. Took Prevacid with relief of symptoms. Denies melena, hematemesis, hematochezia, abdominal pain, abdominal bloating, or f/c/n/v.     Believes his bipolar disorder is well controlled with Risperidone 1mg . Has been referred to psychiatry but has not received a call. No mania or depression.      Outpatient Medications Prior to Visit  Medication Sig Dispense Refill  . hydrOXYzine (ATARAX/VISTARIL) 25 MG tablet Take 1 tablet (25 mg total) by mouth 2 (two) times daily. 60 tablet 5  . lisinopril (PRINIVIL,ZESTRIL) 20 MG tablet Take 1 tablet (20 mg total) daily by mouth. 90 tablet 3  . lovastatin (MEVACOR) 20 MG tablet Take 1 tablet (20 mg total) by mouth at bedtime. 90 tablet 3  . propranolol (INDERAL) 40 MG tablet Take 1 tablet (40 mg total) 2 (two) times daily by mouth. 180 tablet 3  . risperiDONE (RISPERDAL) 1 MG tablet Take 1 tablet (1 mg total) by mouth at bedtime. 30 tablet 5  . varenicline (CHANTIX STARTING MONTH PAK) 0.5 MG X 11 & 1 MG X 42 tablet Take one 0.5 mg tablet by mouth once daily for 3 days, then increase to one 0.5 mg tablet twice daily for 4 days, then increase to one 1 mg tablet twice daily. 53 tablet 0   No facility-administered medications prior to visit.      ROS Review of Systems  Constitutional: Negative for chills, fever and malaise/fatigue.  Eyes: Negative for blurred vision.  Respiratory: Negative for shortness of breath.   Cardiovascular: Negative for chest  pain and palpitations.  Gastrointestinal: Positive for heartburn. Negative for abdominal pain and nausea.  Genitourinary: Negative for dysuria and hematuria.  Musculoskeletal: Negative for joint pain and myalgias.  Skin: Negative for rash.  Neurological: Negative for tingling and headaches.  Psychiatric/Behavioral: Negative for depression. The patient is not nervous/anxious.     Objective:  BP 123/89 (BP Location: Left Arm, Patient Position: Sitting, Cuff Size: Large)   Pulse 76   Temp 97.7 F (36.5 C) (Oral)   Ht 5\' 11"  (1.803 m)   Wt (!) 325 lb (147.4 kg)   SpO2 96%   BMI 45.33 kg/m   BP/Weight 04/03/2018 02/20/2018 10/17/2017  Systolic BP 123 126 121  Diastolic BP 89 78 84  Wt. (Lbs) 325 314.6 300.8  BMI 45.33 43.88 41.95      Physical Exam  Constitutional: He is oriented to person, place, and time.  Well developed, obese, NAD, polite  HENT:  Head: Normocephalic and atraumatic.  Eyes: No scleral icterus.  Neck: Normal range of motion. Neck supple. No thyromegaly present.  Cardiovascular: Normal rate, regular rhythm and normal heart sounds.  Pulmonary/Chest: Effort normal and breath sounds normal.  Abdominal: Soft. Bowel sounds are normal. He exhibits no distension and no mass. There is no tenderness. There is no guarding.  Musculoskeletal: He exhibits no edema.  Neurological: He is alert and oriented to person, place, and time.  Skin:  Skin is warm and dry. No rash noted. No erythema. No pallor.  Psychiatric: He has a normal mood and affect. His behavior is normal. Thought content normal.  Vitals reviewed.    Assessment & Plan:   1. Gastroesophageal reflux disease, esophagitis presence not specified - Begin omeprazole (PRILOSEC) 40 MG capsule; Take 1 capsule (40 mg total) by mouth daily.  Dispense: 30 capsule; Refill: 5  2. Tobacco use disorder - Refill varenicline (CHANTIX) 0.5 MG tablet; Take 1 tablet (0.5 mg total) by mouth 2 (two) times daily.  Dispense: 60  tablet; Refill: 5  3. Bipolar I disorder (HCC) - Awaiting call from psychiatry. Referral specialist has been messaged to look into delay.   Meds ordered this encounter  Medications  . varenicline (CHANTIX) 0.5 MG tablet    Sig: Take 1 tablet (0.5 mg total) by mouth 2 (two) times daily.    Dispense:  60 tablet    Refill:  5    Order Specific Question:   Supervising Provider    Answer:   Hoy RegisterNEWLIN, ENOBONG [[id number]]  . omeprazole (PRILOSEC) 40 MG capsule    Sig: Take 1 capsule (40 mg total) by mouth daily.    Dispense:  30 capsule    Refill:  5    Order Specific Question:   Supervising Provider    Answer:   Hoy RegisterNEWLIN, ENOBONG [[id number]]    Follow-up: Return if symptoms worsen or fail to improve.   Loletta Specteroger David Bliss Tsang PA

## 2018-04-04 ENCOUNTER — Telehealth (INDEPENDENT_AMBULATORY_CARE_PROVIDER_SITE_OTHER): Payer: Self-pay

## 2018-04-04 NOTE — Telephone Encounter (Signed)
-----   Message from Loletta Specteroger David Gomez, PA-C sent at 04/03/2018  5:33 PM EDT ----- Hi Tempestt, hope your day is going well. Can you please call Mr. Leonor LivHolt and [facility]. Thanks.  Roger   ----- Message ----- From: Dionne BucySoler, Nora E Sent: 04/03/2018   3:08 PM To: Loletta Specteroger David Gomez, PA-C  Hi    Welcome Back  I'm ok  Thanks for asking   I hope you feel better soon  Praying for a faster recovery .  I Don't have acces to the behavior health referral when you send it  It goes directly to them . I called Behavior Health and they told me that they are behind but she was nice and  Schedule him  an appointment for  Monday 05/15/18 at 11 am but he has to arrived @ 10 am I called Mr Leonor LivHolt but the call is not going thru    ----- Message ----- From: Loletta SpecterGomez, Roger David, PA-C Sent: 04/03/2018   9:56 AM To: Dionne BucyNora E Soler  Hello, hope you are well. I have put in a psychiatry referral on 02/20/18 but there are no notes as to what happened with the referral. Pt has not received a call. Please see if patient can be referred to psychiatry. Thanks and I hope the rest of your day goes well.

## 2018-04-04 NOTE — Telephone Encounter (Signed)
Patient is aware of behavorial health appointment on 7/15 at 10am. Maryjean Mornempestt S Lesley Atkin, CMA

## 2018-05-15 ENCOUNTER — Ambulatory Visit (HOSPITAL_COMMUNITY): Payer: Medicaid Other | Admitting: Psychiatry

## 2018-07-14 ENCOUNTER — Ambulatory Visit (INDEPENDENT_AMBULATORY_CARE_PROVIDER_SITE_OTHER): Payer: Medicaid Other | Admitting: Physician Assistant

## 2018-07-20 ENCOUNTER — Encounter

## 2018-07-20 ENCOUNTER — Ambulatory Visit (HOSPITAL_COMMUNITY): Payer: Medicaid Other | Admitting: Psychiatry

## 2018-09-06 ENCOUNTER — Ambulatory Visit (HOSPITAL_COMMUNITY): Payer: Medicaid Other | Admitting: Psychiatry

## 2018-10-15 ENCOUNTER — Other Ambulatory Visit (INDEPENDENT_AMBULATORY_CARE_PROVIDER_SITE_OTHER): Payer: Self-pay | Admitting: Physician Assistant

## 2018-10-15 DIAGNOSIS — I10 Essential (primary) hypertension: Secondary | ICD-10-CM

## 2018-10-16 NOTE — Telephone Encounter (Signed)
FWD to PCP. Reily Ilic S Shanti Agresti, CMA  

## 2018-10-27 ENCOUNTER — Ambulatory Visit (HOSPITAL_COMMUNITY): Payer: Self-pay | Admitting: Psychiatry

## 2018-11-07 ENCOUNTER — Encounter (HOSPITAL_COMMUNITY): Payer: Self-pay | Admitting: Psychiatry

## 2018-11-07 ENCOUNTER — Ambulatory Visit (INDEPENDENT_AMBULATORY_CARE_PROVIDER_SITE_OTHER): Payer: Medicaid Other | Admitting: Psychiatry

## 2018-11-07 VITALS — BP 128/76 | Ht 72.0 in | Wt 325.0 lb

## 2018-11-07 DIAGNOSIS — F411 Generalized anxiety disorder: Secondary | ICD-10-CM

## 2018-11-07 DIAGNOSIS — F319 Bipolar disorder, unspecified: Secondary | ICD-10-CM

## 2018-11-07 DIAGNOSIS — F4310 Post-traumatic stress disorder, unspecified: Secondary | ICD-10-CM | POA: Diagnosis not present

## 2018-11-07 DIAGNOSIS — F1210 Cannabis abuse, uncomplicated: Secondary | ICD-10-CM | POA: Diagnosis not present

## 2018-11-07 MED ORDER — BUSPIRONE HCL 5 MG PO TABS: 5.0000 mg | ORAL_TABLET | Freq: Two times a day (BID) | ORAL | 1 refills | Status: DC

## 2018-11-07 MED ORDER — HYDROXYZINE HCL 25 MG PO TABS: 25.0000 mg | ORAL_TABLET | Freq: Every evening | ORAL | 1 refills | Status: DC | PRN

## 2018-11-07 MED ORDER — RISPERIDONE 1 MG PO TABS: 1.0000 mg | ORAL_TABLET | Freq: Every day | ORAL | 1 refills | Status: DC
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
## 2018-12-25 ENCOUNTER — Encounter (HOSPITAL_COMMUNITY): Payer: Self-pay | Admitting: Psychiatry

## 2018-12-25 ENCOUNTER — Telehealth (INDEPENDENT_AMBULATORY_CARE_PROVIDER_SITE_OTHER): Payer: Self-pay | Admitting: Primary Care

## 2018-12-25 ENCOUNTER — Ambulatory Visit (INDEPENDENT_AMBULATORY_CARE_PROVIDER_SITE_OTHER): Payer: Medicaid Other | Admitting: Psychiatry

## 2018-12-25 VITALS — BP 142/80 | Ht 72.0 in | Wt 319.0 lb

## 2018-12-25 DIAGNOSIS — F4310 Post-traumatic stress disorder, unspecified: Secondary | ICD-10-CM

## 2018-12-25 DIAGNOSIS — F411 Generalized anxiety disorder: Secondary | ICD-10-CM | POA: Diagnosis not present

## 2018-12-25 DIAGNOSIS — F1210 Cannabis abuse, uncomplicated: Secondary | ICD-10-CM

## 2018-12-25 DIAGNOSIS — F319 Bipolar disorder, unspecified: Secondary | ICD-10-CM

## 2018-12-25 DIAGNOSIS — K219 Gastro-esophageal reflux disease without esophagitis: Secondary | ICD-10-CM

## 2018-12-25 MED ORDER — RISPERIDONE 1 MG PO TABS: 1.0000 mg | ORAL_TABLET | Freq: Every day | ORAL | 0 refills | Status: DC

## 2018-12-25 MED ORDER — BUSPIRONE HCL 5 MG PO TABS: 5.0000 mg | ORAL_TABLET | Freq: Two times a day (BID) | ORAL | 0 refills | Status: DC

## 2018-12-25 MED ORDER — HYDROXYZINE HCL 25 MG PO TABS: 25.0000 mg | ORAL_TABLET | Freq: Every evening | ORAL | 0 refills | Status: DC | PRN

## 2018-12-25 NOTE — Progress Notes (Signed)
BH MD/PA/NP OP Progress Note  12/25/2018 3:02 PM [name]  MRN:  [id number]  Chief Complaint: I like new medication.  It is helping my nightmares and anxiety.  HPI: [name] came for his appointment.  He is a 38 year old Caucasian, divorced unemployed man who is referred from his primary care physician for the management of his psychiatric illness.  He has a diagnosis of PTSD, bipolar and anxiety disorder.  He also smokes marijuana on a daily basis which helps his anxiety.  He started him on BuSpar on his last visit as he was struggling with anxiety nervousness and having flashbacks.  He liked the BuSpar.  He is taking twice a day.  He is sleeping better.  Despite recommendation to stop the cannabis he continues to smoke cannabis every day because he feels it is working very well.  However he denies any other drug use.  He feels the medicine working very well and he has no more mood swings, highs and lows and paranoia.  Patient has multiple psychiatric inpatient in the past due to drug use, paranoia and hallucination and self abusive behavior.  Patient has no tremors, shakes or any EPS.  He is not interested in therapy.  He like to continue his current medication.  He is sleeping good.  He lives with his girlfriend and 9 dogs.  His girlfriend is very supportive.  His appetite is okay.  His energy level is good.  Currently he is on disability.  Visit Diagnosis:    ICD-10-CM   1. Generalized anxiety disorder F41.1 hydrOXYzine (ATARAX/VISTARIL) 25 MG tablet    busPIRone (BUSPAR) 5 MG tablet  2. PTSD (post-traumatic stress disorder) F43.10 hydrOXYzine (ATARAX/VISTARIL) 25 MG tablet    busPIRone (BUSPAR) 5 MG tablet  3. Bipolar 1 disorder (HCC) F31.9 risperiDONE (RISPERDAL) 1 MG tablet    Past Psychiatric History: Reviewed. H/O abuse, drug use, paranoia, anger, suicidal attempt, hallucination and multiple inpatient treatment.  Seen at [facility].  Tried Zyprexa and Abilify (restless).  H/O drug rehab at  [facility].    Past Medical History:  Past Medical History:  Diagnosis Date  . Bipolar 1 disorder (HCC)   . Hypertension 08/08/2017   History reviewed. No pertinent surgical history.  Family Psychiatric History: Reviewed.  Family History:  Family History  Adopted: Yes  Family history unknown: Yes    Social History:  Social History   Socioeconomic History  . Marital status: Single    Spouse name: Not on file  . Number of children: Not on file  . Years of education: Not on file  . Highest education level: Not on file  Occupational History  . Not on file  Social Needs  . Financial resource strain: Somewhat hard  . Food insecurity:    Worry: Sometimes true    Inability: Sometimes true  . Transportation needs:    Medical: No    Non-medical: No  Tobacco Use  . Smoking status: Current Every Day Smoker    Packs/day: 0.50    Types: Cigarettes  . Smokeless tobacco: Never Used  . Tobacco comment: down to 4 cigarettes a day   Substance and Sexual Activity  . Alcohol use: No  . Drug use: Yes    Types: Marijuana    Comment: daily  . Sexual activity: Yes    Birth control/protection: Condom  Lifestyle  . Physical activity:    Days per week: 3 days    Minutes per session: 90 min  . Stress: To some extent  Relationships  . Social connections:    Talks on phone: Twice a week    Gets together: Once a week    Attends religious service: Never    Active member of club or organization: No    Attends meetings of clubs or organizations: Never    Relationship status: Divorced  Other Topics Concern  . Not on file  Social History Narrative  . Not on file    Allergies: No Known Allergies  Metabolic Disorder Labs: Lab Results  Component Value Date   HGBA1C 5.5 06/16/2017   No results found for: PROLACTIN Lab Results  Component Value Date   CHOL 194 10/17/2017   TRIG 103 10/17/2017   HDL 37 (L) 10/17/2017   CHOLHDL 5.2 (H) 10/17/2017   LDLCALC 136 (H) 10/17/2017   Lab  Results  Component Value Date   TSH 1.430 10/17/2017   TSH 2.113 Test methodology is 3rd generation TSH 11/08/2008    Therapeutic Level Labs: No results found for: LITHIUM No results found for: VALPROATE No components found for:  CBMZ  Current Medications: Current Outpatient Medications  Medication Sig Dispense Refill  . busPIRone (BUSPAR) 5 MG tablet Take 1 tablet (5 mg total) by mouth 2 (two) times daily. 60 tablet 1  . hydrOXYzine (ATARAX/VISTARIL) 25 MG tablet Take 1 tablet (25 mg total) by mouth at bedtime as needed for anxiety (sleep). 30 tablet 1  . lisinopril (PRINIVIL,ZESTRIL) 20 MG tablet TAKE 1 TABLET (20 MG TOTAL) DAILY BY MOUTH. 90 tablet 3  . lovastatin (MEVACOR) 20 MG tablet Take 1 tablet (20 mg total) by mouth at bedtime. 90 tablet 3  . omeprazole (PRILOSEC) 40 MG capsule Take 1 capsule (40 mg total) by mouth daily. 30 capsule 5  . propranolol (INDERAL) 40 MG tablet Take 1 tablet (40 mg total) 2 (two) times daily by mouth. 180 tablet 3  . risperiDONE (RISPERDAL) 1 MG tablet Take 1 tablet (1 mg total) by mouth at bedtime. 30 tablet 1  . varenicline (CHANTIX STARTING MONTH PAK) 0.5 MG X 11 & 1 MG X 42 tablet Take one 0.5 mg tablet by mouth once daily for 3 days, then increase to one 0.5 mg tablet twice daily for 4 days, then increase to one 1 mg tablet twice daily. 53 tablet 0  . varenicline (CHANTIX) 0.5 MG tablet Take 1 tablet (0.5 mg total) by mouth 2 (two) times daily. 60 tablet 5   No current facility-administered medications for this visit.      Musculoskeletal: Strength & Muscle Tone: within normal limits Gait & Station: normal Patient leans: N/A  Psychiatric Specialty Exam: ROS  Blood pressure (!) 142/80, height 6' (1.829 m), weight (!) 319 lb (144.7 kg).Body mass index is 43.26 kg/m.  General Appearance: Disheveled  Eye Contact:  Fair  Speech:  Clear and Coherent  Volume:  Normal  Mood:  Euthymic  Affect:  Congruent  Thought Process:  Goal Directed   Orientation:  Full (Time, Place, and Person)  Thought Content: Logical   Suicidal Thoughts:  No  Homicidal Thoughts:  No  Memory:  Immediate;   Good Recent;   Good Remote;   Good  Judgement:  Good  Insight:  Good  Psychomotor Activity:  Normal  Concentration:  Concentration: Fair and Attention Span: Fair  Recall:  Good  Fund of Knowledge: Good  Language: Good  Akathisia:  No  Handed:  Right  AIMS (if indicated): not done  Assets:  Communication Skills Desire for Improvement Housing Resilience  Social Support  ADL's:  Intact  Cognition: WNL  Sleep:  Fair   Screenings: GAD-7     Office Visit from 04/03/2018 in [facility] Office Visit from 02/20/2018 in [facility] Office Visit from 08/08/2017 in [facility] Office Visit from 06/16/2017 in [facility]  Total GAD-7 Score  1  6  16  11     PHQ2-9     Office Visit from 04/03/2018 in [facility] Office Visit from 02/20/2018 in [facility] Office Visit from 10/17/2017 in [facility] Office Visit from 08/08/2017 in [facility]  PHQ-2 Total Score  0  0  0  0  0       Assessment and Plan: Bipolar disorder type I.  Posttraumatic stress disorder.  Cannabis use.  [name] is doing better with the BuSpar.  Discussed continued cannabis use as it may interfere with his recovery and interaction with psychotropic medication but patient insists that it does help his anxiety.  He is not interested in therapy.  I will continue Risperdal 1 mg at bedtime, hydroxyzine 25 mg during the day and BuSpar 5 mg twice a day.  Patient has no tremors, shakes or any EPS.  Recommended to call us back if he has any question, concern if he feels worsening of the symptoms.  I will see him again in 3 months.   Curtis Nipper,  MD 12/25/2018, 3:02 PM

## 2018-12-25 NOTE — Telephone Encounter (Signed)
Patient called to request med refill for omeprazole (PRILOSEC) 40 MG capsule   Patient uses [id number] [place], [place] - [address] Please advise [phone]  Thank you [name]

## 2018-12-27 MED ORDER — OMEPRAZOLE 40 MG PO CPDR: 40.0000 mg | DELAYED_RELEASE_CAPSULE | Freq: Every day | ORAL | 2 refills | Status: DC

## 2018-12-27 NOTE — Telephone Encounter (Signed)
Patient aware that refill has been sent to his pharmacy. Maryjean Morn, CMA

## 2019-01-10 ENCOUNTER — Ambulatory Visit (INDEPENDENT_AMBULATORY_CARE_PROVIDER_SITE_OTHER): Payer: Medicaid Other | Admitting: Primary Care

## 2019-01-10 ENCOUNTER — Other Ambulatory Visit: Payer: Self-pay

## 2019-01-10 ENCOUNTER — Encounter (INDEPENDENT_AMBULATORY_CARE_PROVIDER_SITE_OTHER): Payer: Self-pay | Admitting: Primary Care

## 2019-01-10 VITALS — BP 110/69 | HR 77 | Temp 98.0°F | Ht 71.0 in | Wt 327.0 lb

## 2019-01-10 DIAGNOSIS — R739 Hyperglycemia, unspecified: Secondary | ICD-10-CM | POA: Diagnosis not present

## 2019-01-10 DIAGNOSIS — K219 Gastro-esophageal reflux disease without esophagitis: Secondary | ICD-10-CM | POA: Diagnosis not present

## 2019-01-10 DIAGNOSIS — F411 Generalized anxiety disorder: Secondary | ICD-10-CM

## 2019-01-10 DIAGNOSIS — E7841 Elevated Lipoprotein(a): Secondary | ICD-10-CM | POA: Diagnosis not present

## 2019-01-10 DIAGNOSIS — F17200 Nicotine dependence, unspecified, uncomplicated: Secondary | ICD-10-CM

## 2019-01-10 DIAGNOSIS — I10 Essential (primary) hypertension: Secondary | ICD-10-CM

## 2019-01-10 DIAGNOSIS — F319 Bipolar disorder, unspecified: Secondary | ICD-10-CM

## 2019-01-10 DIAGNOSIS — R251 Tremor, unspecified: Secondary | ICD-10-CM

## 2019-01-10 LAB — POCT GLYCOSYLATED HEMOGLOBIN (HGB A1C): Hemoglobin A1C: 5.8 % — AB (ref 4.0–5.6)

## 2019-01-10 MED ORDER — LISINOPRIL 20 MG PO TABS: 20.0000 mg | ORAL_TABLET | Freq: Every day | ORAL | 3 refills | Status: DC

## 2019-01-10 MED ORDER — LOVASTATIN 20 MG PO TABS: 20.0000 mg | ORAL_TABLET | Freq: Every day | ORAL | 3 refills | Status: DC

## 2019-01-10 MED ORDER — PROPRANOLOL HCL 40 MG PO TABS: 40.0000 mg | ORAL_TABLET | Freq: Two times a day (BID) | ORAL | 3 refills | Status: DC

## 2019-01-10 MED ORDER — OMEPRAZOLE 40 MG PO CPDR: 40.0000 mg | DELAYED_RELEASE_CAPSULE | Freq: Every day | ORAL | 2 refills | Status: DC

## 2019-01-10 NOTE — Progress Notes (Signed)
Subjective:    Patient ID: [name], male    DOB: 08/19/1981, 38 y.o.   MRN: [id number]  Chief Complaint  Patient presents with  . Hypertension  . Medication Refill    HPI Patient is in today for routine follow up requesting blood work . POCT A1C 5.8 dicussed with him the importance of diet and exercise. Past medical history of bipolar 1 disorder, HTN, tobacco and  bipolar disorder. He has several visit psychiatrist and requires break the glass so I am unaware of dx or tx. Patient continues to smoke at least half a pack a day.   Past Medical History:  Diagnosis Date  . Bipolar 1 disorder (HCC)   . Hypertension 08/08/2017    History reviewed. No pertinent surgical history.  Family History  Adopted: Yes  Family history unknown: Yes    Social History   Socioeconomic History  . Marital status: Single    Spouse name: Not on file  . Number of children: Not on file  . Years of education: Not on file  . Highest education level: Not on file  Occupational History  . Not on file  Social Needs  . Financial resource strain: Somewhat hard  . Food insecurity:    Worry: Sometimes true    Inability: Sometimes true  . Transportation needs:    Medical: No    Non-medical: No  Tobacco Use  . Smoking status: Current Every Day Smoker    Packs/day: 0.50    Types: Cigarettes  . Smokeless tobacco: Never Used  . Tobacco comment: down to 4 cigarettes a day   Substance and Sexual Activity  . Alcohol use: No  . Drug use: Yes    Types: Marijuana    Comment: daily  . Sexual activity: Yes    Birth control/protection: Condom  Lifestyle  . Physical activity:    Days per week: 3 days    Minutes per session: 90 min  . Stress: To some extent  Relationships  . Social connections:    Talks on phone: Twice a week    Gets together: Once a week    Attends religious service: Never    Active member of club or organization: No    Attends meetings of clubs or organizations: Never   Relationship status: Divorced  . Intimate partner violence:    Fear of current or ex partner: No    Emotionally abused: No    Physically abused: No    Forced sexual activity: No  Other Topics Concern  . Not on file  Social History Narrative  . Not on file    Outpatient Medications Prior to Visit  Medication Sig Dispense Refill  . busPIRone (BUSPAR) 5 MG tablet Take 1 tablet (5 mg total) by mouth 2 (two) times daily. 180 tablet 0  . hydrOXYzine (ATARAX/VISTARIL) 25 MG tablet Take 1 tablet (25 mg total) by mouth at bedtime as needed for anxiety (sleep). 90 tablet 0  . risperiDONE (RISPERDAL) 1 MG tablet Take 1 tablet (1 mg total) by mouth at bedtime. 90 tablet 0  . lisinopril (PRINIVIL,ZESTRIL) 20 MG tablet TAKE 1 TABLET (20 MG TOTAL) DAILY BY MOUTH. 90 tablet 3  . omeprazole (PRILOSEC) 40 MG capsule Take 1 capsule (40 mg total) by mouth daily. 30 capsule 2  . lovastatin (MEVACOR) 20 MG tablet Take 1 tablet (20 mg total) by mouth at bedtime. (Patient not taking: Reported on 01/10/2019) 90 tablet 3  . propranolol (INDERAL) 40 MG  tablet Take 1 tablet (40 mg total) 2 (two) times daily by mouth. (Patient not taking: Reported on 01/10/2019) 180 tablet 3   No facility-administered medications prior to visit.     No Known Allergies  Review of Systems  Constitutional: Negative.   HENT: Negative.   Eyes: Negative.   Respiratory: Positive for shortness of breath.   Cardiovascular: Negative.   Gastrointestinal: Negative.   Genitourinary: Negative.   Musculoskeletal: Positive for myalgias.  Skin: Negative.   Neurological: Negative.   Endo/Heme/Allergies: Negative.   Psychiatric/Behavioral: Positive for substance abuse. The patient is nervous/anxious.        Objective:    Physical Exam  Constitutional: He is oriented to person, place, and time. He appears well-developed and well-nourished.  Morbid obesity   Eyes: Pupils are equal, round, and reactive to light. EOM are normal.  Neck:  Normal range of motion. Neck supple.  Cardiovascular: Normal rate and regular rhythm.  Pulmonary/Chest: Effort normal and breath sounds normal.  Abdominal: Soft. Bowel sounds are normal. He exhibits distension.  Musculoskeletal: Normal range of motion.  Neurological: He is oriented to person, place, and time.  Skin: Skin is warm and dry.  Psychiatric: He has a normal mood and affect.    BP 110/69 (BP Location: Right Arm, Patient Position: Sitting, Cuff Size: Large)   Pulse 77   Temp 98 F (36.7 C) (Oral)   Ht 5\' 11"  (1.803 m)   Wt (!) 327 lb (148.3 kg)   SpO2 95%   BMI 45.61 kg/m  Wt Readings from Last 3 Encounters:  01/10/19 (!) 327 lb (148.3 kg)  04/03/18 (!) 325 lb (147.4 kg)  02/20/18 (!) 314 lb 9.6 oz (142.7 kg)    There are no preventive care reminders to display for this patient.  There are no preventive care reminders to display for this patient.   Lab Results  Component Value Date   TSH 1.430 10/17/2017   Lab Results  Component Value Date   WBC 9.0 10/17/2017   HGB 14.9 10/17/2017   HCT 44.2 10/17/2017   MCV 86 10/17/2017   PLT 172 10/17/2017   Lab Results  Component Value Date   NA 140 10/17/2017   K 4.7 10/17/2017   CO2 25 10/17/2017   GLUCOSE 92 10/17/2017   BUN 15 10/17/2017   CREATININE 0.94 10/17/2017   BILITOT 0.3 10/17/2017   ALKPHOS 64 10/17/2017   AST 20 10/17/2017   ALT 33 10/17/2017   PROT 6.3 10/17/2017   ALBUMIN 3.9 10/17/2017   CALCIUM 9.1 10/17/2017   ANIONGAP 8 05/29/2017   Lab Results  Component Value Date   CHOL 194 10/17/2017   Lab Results  Component Value Date   HDL 37 (L) 10/17/2017   Lab Results  Component Value Date   LDLCALC 136 (H) 10/17/2017   Lab Results  Component Value Date   TRIG 103 10/17/2017   Lab Results  Component Value Date   CHOLHDL 5.2 (H) 10/17/2017   Lab Results  Component Value Date   HGBA1C 5.8 (A) 01/10/2019       Assessment & Plan:   Problem List Items Addressed This Visit     Tobacco use disorder   Hypertension   Relevant Medications   lisinopril (PRINIVIL,ZESTRIL) 20 MG tablet    Other Visit Diagnoses    Hyperglycemia    -  Primary   Relevant Orders   HgB A1c (Completed)   Lipid Panel   Comprehensive metabolic panel   CBC with  Differential   Elevated lipoprotein(a)       Bipolar I disorder (HCC)       Gastroesophageal reflux disease, esophagitis presence not specified       Relevant Medications   omeprazole (PRILOSEC) 40 MG capsule     Bayley was seen today for hypertension and medication refill.  Diagnoses and all orders for this visit:  Hyperglycemia discussed diet exercise and weight loss -     HgB A1c -     Lipid Panel -     Comprehensive metabolic panel -     CBC with Differential  Tobacco use disorder  Each visit d/w cessation   Essential hypertension On lisinopril needs to take regulary for BP and renal protection new dx Pre diabetes   Elevated lipoprotein(a) Not taking statin checking lipid panel  Bipolar I disorder (HCC) treated outside of practice  Hypertension, unspecified type -     lisinopril (PRINIVIL,ZESTRIL) 20 MG tablet; Take 1 tablet (20 mg total) by mouth daily.  Gastroesophageal reflux disease, esophagitis presence not specified -     omeprazole (PRILOSEC) 40 MG capsule; Take 1 capsule (40 mg total) by mouth daily.    Meds ordered this encounter  Medications  . lisinopril (PRINIVIL,ZESTRIL) 20 MG tablet    Sig: Take 1 tablet (20 mg total) by mouth daily.    Dispense:  90 tablet    Refill:  3    DX Code Needed  .  . omeprazole (PRILOSEC) 40 MG capsule    Sig: Take 1 capsule (40 mg total) by mouth daily.    Dispense:  30 capsule    Refill:  2     Grayce Sessions, NP

## 2019-01-10 NOTE — Patient Instructions (Signed)
Prediabetes Eating Plan  Prediabetes is a condition that causes blood sugar (glucose) levels to be higher than normal. This increases the risk for developing diabetes. In order to prevent diabetes from developing, your health care provider may recommend a diet and other lifestyle changes to help you:  · Control your blood glucose levels.  · Improve your cholesterol levels.  · Manage your blood pressure.  Your health care provider may recommend working with a diet and nutrition specialist (dietitian) to make a meal plan that is best for you.  What are tips for following this plan?  Lifestyle  · Set weight loss goals with the help of your health care team. It is recommended that most people with prediabetes lose 7% of their current body weight.  · Exercise for at least 30 minutes at least 5 days a week.  · Attend a support group or seek ongoing support from a mental health counselor.  · Take over-the-counter and prescription medicines only as told by your health care provider.  Reading food labels  · Read food labels to check the amount of fat, salt (sodium), and sugar in prepackaged foods. Avoid foods that have:  ? Saturated fats.  ? Trans fats.  ? Added sugars.  · Avoid foods that have more than 300 milligrams (mg) of sodium per serving. Limit your daily sodium intake to less than 2,300 mg each day.  Shopping  · Avoid buying pre-made and processed foods.  Cooking  · Cook with olive oil. Do not use butter, lard, or ghee.  · Bake, broil, grill, or boil foods. Avoid frying.  Meal planning    · Work with your dietitian to develop an eating plan that is right for you. This may include:  ? Tracking how many calories you take in. Use a food diary, notebook, or mobile application to track what you eat at each meal.  ? Using the glycemic index (GI) to plan your meals. The index tells you how quickly a food will raise your blood glucose. Choose low-GI foods. These foods take a longer time to raise blood glucose.  · Consider  following a Mediterranean diet. This diet includes:  ? Several servings each day of fresh fruits and vegetables.  ? Eating fish at least twice a week.  ? Several servings each day of whole grains, beans, nuts, and seeds.  ? Using olive oil instead of other fats.  ? Moderate alcohol consumption.  ? Eating small amounts of red meat and whole-fat dairy.  · If you have high blood pressure, you may need to limit your sodium intake or follow a diet such as the DASH eating plan. DASH is an eating plan that aims to lower high blood pressure.  What foods are recommended?  The items listed below may not be a complete list. Talk with your dietitian about what dietary choices are best for you.  Grains  Whole grains, such as whole-wheat or whole-grain breads, crackers, cereals, and pasta. Unsweetened oatmeal. Bulgur. Barley. Quinoa. Brown rice. Corn or whole-wheat flour tortillas or taco shells.  Vegetables  Lettuce. Spinach. Peas. Beets. Cauliflower. Cabbage. Broccoli. Carrots. Tomatoes. Squash. Eggplant. Herbs. Peppers. Onions. Cucumbers. Brussels sprouts.  Fruits  Berries. Bananas. Apples. Oranges. Grapes. Papaya. Mango. Pomegranate. Kiwi. Grapefruit. Cherries.  Meats and other protein foods  Seafood. Poultry without skin. Lean cuts of pork and beef. Tofu. Eggs. Nuts. Beans.  Dairy  Low-fat or fat-free dairy products, such as yogurt, cottage cheese, and cheese.  Beverages  Water.   Tea. Coffee. Sugar-free or diet soda. Seltzer water. Lowfat or no-fat milk. Milk alternatives, such as soy or almond milk.  Fats and oils  Olive oil. Canola oil. Sunflower oil. Grapeseed oil. Avocado. Walnuts.  Sweets and desserts  Sugar-free or low-fat pudding. Sugar-free or low-fat ice cream and other frozen treats.  Seasoning and other foods  Herbs. Sodium-free spices. Mustard. Relish. Low-fat, low-sugar ketchup. Low-fat, low-sugar barbecue sauce. Low-fat or fat-free mayonnaise.  What foods are not recommended?  The items listed below may not be a  complete list. Talk with your dietitian about what dietary choices are best for you.  Grains  Refined white flour and flour products, such as bread, pasta, snack foods, and cereals.  Vegetables  Canned vegetables. Frozen vegetables with butter or cream sauce.  Fruits  Fruits canned with syrup.  Meats and other protein foods  Fatty cuts of meat. Poultry with skin. Breaded or fried meat. Processed meats.  Dairy  Full-fat yogurt, cheese, or milk.  Beverages  Sweetened drinks, such as sweet iced tea and soda.  Fats and oils  Butter. Lard. Ghee.  Sweets and desserts  Baked goods, such as cake, cupcakes, pastries, cookies, and cheesecake.  Seasoning and other foods  Spice mixes with added salt. Ketchup. Barbecue sauce. Mayonnaise.  Summary  · To prevent diabetes from developing, you may need to make diet and other lifestyle changes to help control blood sugar, improve cholesterol levels, and manage your blood pressure.  · Set weight loss goals with the help of your health care team. It is recommended that most people with prediabetes lose 7 percent of their current body weight.  · Consider following a Mediterranean diet that includes plenty of fresh fruits and vegetables, whole grains, beans, nuts, seeds, fish, lean meat, low-fat dairy, and healthy oils.  This information is not intended to replace advice given to you by your health care provider. Make sure you discuss any questions you have with your health care provider.  Document Released: 03/04/2015 Document Revised: 12/22/2016 Document Reviewed: 12/22/2016  Elsevier Interactive Patient Education © 2019 [employer]

## 2019-01-11 ENCOUNTER — Encounter (INDEPENDENT_AMBULATORY_CARE_PROVIDER_SITE_OTHER): Payer: Self-pay | Admitting: Primary Care

## 2019-01-11 LAB — CBC WITH DIFFERENTIAL/PLATELET
Basophils Absolute: 0.1 10*3/uL (ref 0.0–0.2)
Basos: 1 %
EOS (ABSOLUTE): 0.5 10*3/uL — ABNORMAL HIGH (ref 0.0–0.4)
Eos: 5 %
Hematocrit: 43 % (ref 37.5–51.0)
Hemoglobin: 14.6 g/dL (ref 13.0–17.7)
Immature Grans (Abs): 0 10*3/uL (ref 0.0–0.1)
Immature Granulocytes: 0 %
Lymphocytes Absolute: 2.7 10*3/uL (ref 0.7–3.1)
Lymphs: 27 %
MCH: 28.4 pg (ref 26.6–33.0)
MCHC: 34 g/dL (ref 31.5–35.7)
MCV: 84 fL (ref 79–97)
Monocytes Absolute: 1.2 10*3/uL — ABNORMAL HIGH (ref 0.1–0.9)
Monocytes: 12 %
Neutrophils Absolute: 5.5 10*3/uL (ref 1.4–7.0)
Neutrophils: 55 %
Platelets: 205 10*3/uL (ref 150–450)
RBC: 5.14 x10E6/uL (ref 4.14–5.80)
RDW: 13.2 % (ref 11.6–15.4)
WBC: 10 10*3/uL (ref 3.4–10.8)

## 2019-01-11 LAB — COMPREHENSIVE METABOLIC PANEL
ALBUMIN: 4.1 g/dL (ref 4.0–5.0)
ALK PHOS: 68 IU/L (ref 39–117)
ALT: 42 IU/L (ref 0–44)
AST: 30 IU/L (ref 0–40)
Albumin/Globulin Ratio: 1.7 (ref 1.2–2.2)
BUN/Creatinine Ratio: 11 (ref 9–20)
BUN: 12 mg/dL (ref 6–20)
Bilirubin Total: 0.4 mg/dL (ref 0.0–1.2)
CO2: 20 mmol/L (ref 20–29)
CREATININE: 1.08 mg/dL (ref 0.76–1.27)
Calcium: 9.2 mg/dL (ref 8.7–10.2)
Chloride: 102 mmol/L (ref 96–106)
GFR calc Af Amer: 100 mL/min/{1.73_m2} (ref >59)
GFR calc non Af Amer: 87 mL/min/{1.73_m2} (ref >59)
Globulin, Total: 2.4 g/dL (ref 1.5–4.5)
Glucose: 105 mg/dL — ABNORMAL HIGH (ref 65–99)
Potassium: 4.6 mmol/L (ref 3.5–5.2)
Sodium: 139 mmol/L (ref 134–144)
Total Protein: 6.5 g/dL (ref 6.0–8.5)

## 2019-01-11 LAB — LIPID PANEL
Chol/HDL Ratio: 6.4 ratio — ABNORMAL HIGH (ref 0.0–5.0)
Cholesterol, Total: 218 mg/dL — ABNORMAL HIGH (ref 100–199)
HDL: 34 mg/dL — ABNORMAL LOW (ref >39)
LDL Calculated: 150 mg/dL — ABNORMAL HIGH (ref 0–99)
Triglycerides: 169 mg/dL — ABNORMAL HIGH (ref 0–149)
VLDL Cholesterol Cal: 34 mg/dL (ref 5–40)

## 2019-03-27 ENCOUNTER — Ambulatory Visit (INDEPENDENT_AMBULATORY_CARE_PROVIDER_SITE_OTHER): Payer: Medicaid Other | Admitting: Psychiatry

## 2019-03-27 ENCOUNTER — Encounter (HOSPITAL_COMMUNITY): Payer: Self-pay | Admitting: Psychiatry

## 2019-03-27 ENCOUNTER — Other Ambulatory Visit: Payer: Self-pay

## 2019-03-27 DIAGNOSIS — F4310 Post-traumatic stress disorder, unspecified: Secondary | ICD-10-CM

## 2019-03-27 DIAGNOSIS — F1210 Cannabis abuse, uncomplicated: Secondary | ICD-10-CM

## 2019-03-27 DIAGNOSIS — F411 Generalized anxiety disorder: Secondary | ICD-10-CM

## 2019-03-27 DIAGNOSIS — F319 Bipolar disorder, unspecified: Secondary | ICD-10-CM

## 2019-03-27 MED ORDER — RISPERIDONE 1 MG PO TABS: 1.0000 mg | ORAL_TABLET | Freq: Every day | ORAL | 0 refills | Status: DC

## 2019-03-27 MED ORDER — BUSPIRONE HCL 5 MG PO TABS: 5.0000 mg | ORAL_TABLET | Freq: Two times a day (BID) | ORAL | 0 refills | Status: DC

## 2019-03-27 MED ORDER — TRAZODONE HCL 100 MG PO TABS: ORAL_TABLET | ORAL | 1 refills | Status: DC

## 2019-03-27 NOTE — Progress Notes (Signed)
Virtual Visit via Telephone Note  I connected with [name] on 03/27/19 at  3:00 PM EDT by telephone and verified that I am speaking with the correct person using two identifiers.   I discussed the limitations, risks, security and privacy concerns of performing an evaluation and management service by telephone and the availability of in person appointments. I also discussed with the patient that there may be a patient responsible charge related to this service. The patient expressed understanding and agreed to proceed.   History of Present Illness: Patient was evaluated by phone session.  He admitted increase anxiety due to COVID-19.  He does not leave the house and he feels isolation makes him more anxious.  When talk about his medication he replied that he is taking BuSpar 5 mg daily.  I explained that he is supposed to take twice a day and he apologized that he is not taking twice a day.  He also endorsed poor sleep and racing thoughts and sometimes nightmares.  Though he is taking hydroxyzine but it does not help as much.  In the past he used to take trazodone which worked very well.  He still smoke cannabis every day and he does not want to quit because it helps him relax.  He denies any highs and lows, mood swings, irritability, anger or any paranoia.  He lives with his girlfriend who is very supportive and 9 dogs.  His energy level is good.  Recently he had blood work.  His hemoglobin A1c is slightly high, lipid panel shows high cholesterol and LDL and comprehensive metabolic panel was normal other than mild elevation of glucose.  Patient reported no tremors, shakes or any EPS.  He is on disability.  He denies any intravenous drug use.    Past Psychiatric History: Reviewed. H/O abuse, drug use, paranoia, anger, suicidal attempt, hallucination and multiple inpatient treatment.  Seen at [facility].  Tried Zyprexa, Seroquel and Abilify (restless).  H/O drug rehab at [facility].  Recent Results (from the  past 2160 hour(s))  HgB A1c     Status: Abnormal   Collection Time: 01/10/19 10:00 AM  Result Value Ref Range   Hemoglobin A1C 5.8 (A) 4.0 - 5.6 %   HbA1c POC (<> result, manual entry)     HbA1c, POC (prediabetic range)     HbA1c, POC (controlled diabetic range)    Lipid Panel     Status: Abnormal   Collection Time: 01/10/19 10:33 AM  Result Value Ref Range   Cholesterol, Total 218 (H) 100 - 199 mg/dL   Triglycerides 161 (H) 0 - 149 mg/dL   HDL 34 (L) >09 mg/dL   VLDL Cholesterol Cal 34 5 - 40 mg/dL   LDL Calculated 604 (H) 0 - 99 mg/dL   Chol/HDL Ratio 6.4 (H) 0.0 - 5.0 ratio    Comment:                                   T. Chol/HDL Ratio                                             Men  Women  1/2 Avg.Risk  3.4    3.3                                   Avg.Risk  5.0    4.4                                2X Avg.Risk  9.6    7.1                                3X Avg.Risk 23.4   11.0   Comprehensive metabolic panel     Status: Abnormal   Collection Time: 01/10/19 10:33 AM  Result Value Ref Range   Glucose 105 (H) 65 - 99 mg/dL   BUN 12 6 - 20 mg/dL   Creatinine, Ser 1.611.08 0.76 - 1.27 mg/dL   GFR calc non Af Amer 87 >59 mL/min/1.73   GFR calc Af Amer 100 >59 mL/min/1.73   BUN/Creatinine Ratio 11 9 - 20   Sodium 139 134 - 144 mmol/L   Potassium 4.6 3.5 - 5.2 mmol/L   Chloride 102 96 - 106 mmol/L   CO2 20 20 - 29 mmol/L   Calcium 9.2 8.7 - 10.2 mg/dL   Total Protein 6.5 6.0 - 8.5 g/dL   Albumin 4.1 4.0 - 5.0 g/dL   Globulin, Total 2.4 1.5 - 4.5 g/dL   Albumin/Globulin Ratio 1.7 1.2 - 2.2   Bilirubin Total 0.4 0.0 - 1.2 mg/dL   Alkaline Phosphatase 68 39 - 117 IU/L   AST 30 0 - 40 IU/L   ALT 42 0 - 44 IU/L  CBC with Differential     Status: Abnormal   Collection Time: 01/10/19 10:33 AM  Result Value Ref Range   WBC 10.0 3.4 - 10.8 x10E3/uL   RBC 5.14 4.14 - 5.80 x10E6/uL   Hemoglobin 14.6 13.0 - 17.7 g/dL   Hematocrit 09.643.0 04.537.5 - 51.0 %   MCV  84 79 - 97 fL   MCH 28.4 26.6 - 33.0 pg   MCHC 34.0 31.5 - 35.7 g/dL   RDW 40.913.2 81.111.6 - 91.415.4 %   Platelets 205 150 - 450 x10E3/uL   Neutrophils 55 Not Estab. %   Lymphs 27 Not Estab. %   Monocytes 12 Not Estab. %   Eos 5 Not Estab. %   Basos 1 Not Estab. %   Neutrophils Absolute 5.5 1.4 - 7.0 x10E3/uL   Lymphocytes Absolute 2.7 0.7 - 3.1 x10E3/uL   Monocytes Absolute 1.2 (H) 0.1 - 0.9 x10E3/uL   EOS (ABSOLUTE) 0.5 (H) 0.0 - 0.4 x10E3/uL   Basophils Absolute 0.1 0.0 - 0.2 x10E3/uL   Immature Granulocytes 0 Not Estab. %   Immature Grans (Abs) 0.0 0.0 - 0.1 x10E3/uL     Psychiatric Specialty Exam: Physical Exam  ROS  There were no vitals taken for this visit.There is no height or weight on file to calculate BMI.  General Appearance: NA  Eye Contact:  NA  Speech:  Clear and Coherent  Volume:  Normal  Mood:  Anxious  Affect:  NA  Thought Process:  Goal Directed  Orientation:  Full (Time, Place, and Person)  Thought Content:  Logical  Suicidal Thoughts:  No  Homicidal Thoughts:  No  Memory:  Immediate;   Good Recent;  Good Remote;   Good  Judgement:  Good  Insight:  Good  Psychomotor Activity:  NA  Concentration:  Concentration: Fair and Attention Span: Fair  Recall:  Good  Fund of Knowledge:  Good  Language:  Good  Akathisia:  No  Handed:  Right  AIMS (if indicated):     Assets:  Communication Skills Desire for Improvement Housing Social Support  ADL's:  Intact  Cognition:  WNL  Sleep:         Assessment and Plan: Bipolar disorder type I.  Posttraumatic stress disorder.  Cannabis use.  Generalized anxiety disorder  Discussed to take the medicine as prescribed.  Patient promised that he will start taking BuSpar 5 mg twice a day which he was taking before with good response.  I will discontinue hydroxyzine since it is not helping as much sleep and we will start trazodone 50 mg to 100 mg to help his insomnia.  He is not interested in therapy.  Discussed stopping  his cannabis use.  Patient admitted he has been smoking but like to slowly come off.  I reviewed blood work results with him.  Discussed medication side effects and benefits.  Recommended to call us back if is any question or any concern.  Follow-up in 3 months.  Follow Up Instructions:    I discussed the assessment and treatment plan with the patient. The patient was provided an opportunity to ask questions and all were answered. The patient agreed with the plan and demonstrated an understanding of the instructions.   The patient was advised to call back or seek an in-person evaluation if the symptoms worsen or if the condition fails to improve as anticipated.  I provided 25 minutes of non-face-to-face time during this encounter.   Cleotis Nipper, MD

## 2019-04-09 ENCOUNTER — Ambulatory Visit
Admission: EM | Admit: 2019-04-09 | Discharge: 2019-04-09 | Disposition: A | Discharge status: Home / Self Care | Payer: Medicaid Other | Attending: Physician Assistant | Admitting: Physician Assistant

## 2019-04-09 ENCOUNTER — Encounter: Payer: Self-pay | Admitting: Emergency Medicine

## 2019-04-09 ENCOUNTER — Other Ambulatory Visit: Payer: Self-pay

## 2019-04-09 DIAGNOSIS — B029 Zoster without complications: Secondary | ICD-10-CM

## 2019-04-09 MED ORDER — VALACYCLOVIR HCL 1 G PO TABS: 1000.0000 mg | ORAL_TABLET | Freq: Three times a day (TID) | ORAL | 0 refills | Status: AC

## 2019-04-09 NOTE — ED Provider Notes (Signed)
[facility]    CSN: [id number] Arrival date & time: 04/09/19  1139     History   Chief Complaint Chief Complaint  Patient presents with  . Rash    HPI [name] is a 38 y.o. male.   38 year old male comes in for 2-day history of rash.  Rash is to the left side of forehead, burning in sensation when it first started, and also slightly itching.  States rash has traveled further up his forehead since starting.  Denies spreading erythema, warmth, pain.  Denies fever, chills, night sweats. Denies numbness/tingling. Denies one sided facial drooping.  Denies any new exposures/hygiene product changes.  Denies changes in vision, photophobia.  Denies similar rash to the nose or the ears.  Has history of herpes zoster 10 to 15 years ago.     Past Medical History:  Diagnosis Date  . Bipolar 1 disorder ([place])   . Hypertension 08/08/2017    Patient Active Problem List   Diagnosis Date Noted  . Tobacco use disorder 08/08/2017  . Hypertension 08/08/2017  . Insomnia 08/08/2017  . Viral syndrome 10/22/2013    History reviewed. No pertinent surgical history.     Home Medications    Prior to Admission medications   Medication Sig Start Date End Date Taking? Authorizing Provider  busPIRone (BUSPAR) 5 MG tablet Take 1 tablet (5 mg total) by mouth 2 (two) times daily. 03/27/19 03/26/20  Arfeen, Arlyce Harman, MD  lisinopril (PRINIVIL,ZESTRIL) 20 MG tablet Take 1 tablet (20 mg total) by mouth daily. 01/10/19   Kerin Perna, NP  lovastatin (MEVACOR) 20 MG tablet Take 1 tablet (20 mg total) by mouth at bedtime. 01/10/19   Kerin Perna, NP  omeprazole (PRILOSEC) 40 MG capsule Take 1 capsule (40 mg total) by mouth daily. 01/10/19   Kerin Perna, NP  propranolol (INDERAL) 40 MG tablet Take 1 tablet (40 mg total) by mouth 2 (two) times daily. 01/10/19   Kerin Perna, NP  risperiDONE (RISPERDAL) 1 MG tablet Take 1 tablet (1 mg total) by mouth at bedtime. 03/27/19    Arfeen, Arlyce Harman, MD  traZODone (DESYREL) 100 MG tablet Take 1/2 to full tab at bed time 03/27/19   Arfeen, Arlyce Harman, MD  valACYclovir (VALTREX) 1000 MG tablet Take 1 tablet (1,000 mg total) by mouth 3 (three) times daily for 7 days. 04/09/19 04/16/19  Ok Edwards, PA-C    Family History Family History  Adopted: Yes  Family history unknown: Yes    Social History Social History   Tobacco Use  . Smoking status: Current Every Day Smoker    Packs/day: 0.50    Types: Cigarettes  . Smokeless tobacco: Never Used  . Tobacco comment: down to 4 cigarettes a day   Substance Use Topics  . Alcohol use: No  . Drug use: Yes    Types: Marijuana    Comment: daily     Allergies   Patient has no known allergies.   Review of Systems Review of Systems  Reason unable to perform ROS: See HPI as above.     Physical Exam Triage Vital Signs ED Triage Vitals  Enc Vitals Group     BP 04/09/19 1151 120/85     Pulse Rate 04/09/19 1151 70     Resp 04/09/19 1151 20     Temp 04/09/19 1151 98.6 F (37 C)     Temp Source 04/09/19 1151 Oral     SpO2 04/09/19 1151 95 %  Weight --      Height --      Head Circumference --      Peak Flow --      Pain Score 04/09/19 1150 6     Pain Loc --      Pain Edu? --      Excl. in GC? --    No data found.  Updated Vital Signs BP 120/85 (BP Location: Right Arm)   Pulse 70   Temp 98.6 F (37 C) (Oral)   Resp 20   SpO2 95%   Physical Exam Constitutional:      General: He is not in acute distress.    Appearance: He is well-developed. He is not ill-appearing, toxic-appearing or diaphoretic.  HENT:     Head: Normocephalic and atraumatic.  Eyes:     Extraocular Movements: Extraocular movements intact.     Conjunctiva/sclera: Conjunctivae normal.     Pupils: Pupils are equal, round, and reactive to light.  Neck:     Musculoskeletal: Normal range of motion and neck supple.  Skin:    Comments: See picture below.  Vesicular rash with surrounding  erythema.  No extension to the eye, ear, nose.  Neurological:     Mental Status: He is alert and oriented to person, place, and time.     GCS: GCS eye subscore is 4. GCS verbal subscore is 5. GCS motor subscore is 6.     Cranial Nerves: Cranial nerves are intact.     Sensory: Sensation is intact.        UC Treatments / Results  Labs (all labs ordered are listed, but only abnormal results are displayed) Labs Reviewed - No data to display  EKG None  Radiology No results found.  Procedures Procedures (including critical care time)  Medications Ordered in UC Medications - No data to display  Initial Impression / Assessment and Plan / UC Course  I have reviewed the triage vital signs and the nursing notes.  Pertinent labs & imaging results that were available during my care of the patient were reviewed by me and considered in my medical decision making (see chart for details).    Patient to start Valtrex as directed.  Patient to follow-up with PCP for further monitoring and management needed.  Strict return precautions given.  Patient expresses understanding and agrees to plan.  Final Clinical Impressions(s) / UC Diagnoses   Final diagnoses:  Herpes zoster without complication    ED Prescriptions    Medication Sig Dispense Auth. Provider   valACYclovir (VALTREX) 1000 MG tablet Take 1 tablet (1,000 mg total) by mouth 3 (three) times daily for 7 days. 21 tablet Threasa AlphaYu, Quintin Hjort V, PA-C        Sheryn Aldaz V, [place]PA-C 04/09/19 1246

## 2019-04-09 NOTE — ED Notes (Signed)
Patient able to ambulate independently  

## 2019-04-09 NOTE — Discharge Instructions (Signed)
Start Valtrex as directed.  Avoid scratching area.  Follow-up with PCP for further monitoring and management needed.  As discussed, if rash spreads to the eye, ear, nose, please go to the emergency department for further evaluation.

## 2019-04-09 NOTE — ED Triage Notes (Signed)
Pt presents for blister like rash to left side of forehead starting 2 days ago, without any known exposure.  Pt states it is itchy and burning.

## 2019-05-29 ENCOUNTER — Other Ambulatory Visit: Payer: Self-pay

## 2019-05-29 ENCOUNTER — Encounter (HOSPITAL_COMMUNITY): Payer: Self-pay | Admitting: Psychiatry

## 2019-05-29 ENCOUNTER — Ambulatory Visit (INDEPENDENT_AMBULATORY_CARE_PROVIDER_SITE_OTHER): Payer: Medicaid Other | Admitting: Psychiatry

## 2019-05-29 DIAGNOSIS — F4310 Post-traumatic stress disorder, unspecified: Secondary | ICD-10-CM | POA: Diagnosis not present

## 2019-05-29 DIAGNOSIS — F1210 Cannabis abuse, uncomplicated: Secondary | ICD-10-CM | POA: Diagnosis not present

## 2019-05-29 DIAGNOSIS — F411 Generalized anxiety disorder: Secondary | ICD-10-CM | POA: Diagnosis not present

## 2019-05-29 DIAGNOSIS — F319 Bipolar disorder, unspecified: Secondary | ICD-10-CM

## 2019-05-29 MED ORDER — RISPERIDONE 1 MG PO TABS: 1.0000 mg | ORAL_TABLET | Freq: Every day | ORAL | 0 refills | Status: DC

## 2019-05-29 MED ORDER — TRAZODONE HCL 100 MG PO TABS: ORAL_TABLET | ORAL | 0 refills | Status: DC

## 2019-05-29 MED ORDER — BUSPIRONE HCL 5 MG PO TABS: 5.0000 mg | ORAL_TABLET | Freq: Two times a day (BID) | ORAL | 0 refills | Status: DC
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
## 2019-07-04 ENCOUNTER — Other Ambulatory Visit (INDEPENDENT_AMBULATORY_CARE_PROVIDER_SITE_OTHER): Payer: Self-pay | Admitting: Primary Care

## 2019-07-04 DIAGNOSIS — K219 Gastro-esophageal reflux disease without esophagitis: Secondary | ICD-10-CM

## 2019-07-12 ENCOUNTER — Encounter (INDEPENDENT_AMBULATORY_CARE_PROVIDER_SITE_OTHER): Payer: Self-pay | Admitting: Primary Care

## 2019-07-12 ENCOUNTER — Other Ambulatory Visit: Payer: Self-pay

## 2019-07-12 ENCOUNTER — Ambulatory Visit (INDEPENDENT_AMBULATORY_CARE_PROVIDER_SITE_OTHER): Payer: Medicaid Other | Admitting: Primary Care

## 2019-07-12 VITALS — BP 103/79 | HR 79 | Temp 97.8°F | Ht 71.0 in | Wt 313.4 lb

## 2019-07-12 DIAGNOSIS — Z760 Encounter for issue of repeat prescription: Secondary | ICD-10-CM

## 2019-07-12 DIAGNOSIS — Z6841 Body Mass Index (BMI) 40.0 and over, adult: Secondary | ICD-10-CM

## 2019-07-12 DIAGNOSIS — R251 Tremor, unspecified: Secondary | ICD-10-CM

## 2019-07-12 DIAGNOSIS — F17200 Nicotine dependence, unspecified, uncomplicated: Secondary | ICD-10-CM

## 2019-07-12 DIAGNOSIS — F411 Generalized anxiety disorder: Secondary | ICD-10-CM

## 2019-07-12 DIAGNOSIS — F17210 Nicotine dependence, cigarettes, uncomplicated: Secondary | ICD-10-CM

## 2019-07-12 DIAGNOSIS — R7303 Prediabetes: Secondary | ICD-10-CM

## 2019-07-12 DIAGNOSIS — I10 Essential (primary) hypertension: Secondary | ICD-10-CM

## 2019-07-12 DIAGNOSIS — E7841 Elevated Lipoprotein(a): Secondary | ICD-10-CM

## 2019-07-12 MED ORDER — LOVASTATIN 20 MG PO TABS: 20.0000 mg | ORAL_TABLET | Freq: Every day | ORAL | 0 refills | Status: DC

## 2019-07-12 MED ORDER — PROPRANOLOL HCL 40 MG PO TABS: 40.0000 mg | ORAL_TABLET | Freq: Two times a day (BID) | ORAL | 3 refills | Status: DC

## 2019-07-12 MED ORDER — LISINOPRIL 20 MG PO TABS: 20.0000 mg | ORAL_TABLET | Freq: Every day | ORAL | 3 refills | Status: DC

## 2019-07-12 NOTE — Progress Notes (Signed)
Established Patient Office Visit  Subjective:  Patient ID: [name], male    DOB: 12/24/1980  Age: 38 y.o. MRN: [id number]  CC:  Chief Complaint  Patient presents with  . Follow-up    HTN/weight loss    HPI [name] presents for management of hypertension. He denies shortness of breath, headaches, chest pain or lower extremity edema.  He also is voicing concerns with weight gain due to the pandemic decrease in exercise due to gym facilities not available.  Advised patient to walk at least 30 minutes daily and modify diet to achieve weight loss and healthy lifestyle.  Past Medical History:  Diagnosis Date  . Bipolar 1 disorder ([place])   . Hypertension 08/08/2017    No past surgical history on file.  Family History  Adopted: Yes  Family history unknown: Yes    Social History   Socioeconomic History  . Marital status: Single    Spouse name: Not on file  . Number of children: Not on file  . Years of education: Not on file  . Highest education level: Not on file  Occupational History  . Not on file  Social Needs  . Financial resource strain: Somewhat hard  . Food insecurity    Worry: Sometimes true    Inability: Sometimes true  . Transportation needs    Medical: No    Non-medical: No  Tobacco Use  . Smoking status: Current Every Day Smoker    Packs/day: 0.50    Types: Cigarettes  . Smokeless tobacco: Never Used  . Tobacco comment: down to 4 cigarettes a day   Substance and Sexual Activity  . Alcohol use: No  . Drug use: Yes    Types: Marijuana    Comment: daily  . Sexual activity: Yes    Birth control/protection: Condom  Lifestyle  . Physical activity    Days per week: 3 days    Minutes per session: 90 min  . Stress: To some extent  Relationships  . Social Herbalist on phone: Twice a week    Gets together: Once a week    Attends religious service: Never    Active member of club or organization: No    Attends meetings of clubs or  organizations: Never    Relationship status: Divorced  . Intimate partner violence    Fear of current or ex partner: No    Emotionally abused: No    Physically abused: No    Forced sexual activity: No  Other Topics Concern  . Not on file  Social History Narrative  . Not on file    Outpatient Medications Prior to Visit  Medication Sig Dispense Refill  . busPIRone (BUSPAR) 5 MG tablet Take 1 tablet (5 mg total) by mouth 2 (two) times daily. 180 tablet 0  . hydrOXYzine (ATARAX/VISTARIL) 25 MG tablet Take 25 mg by mouth as needed.    Marland Kitchen omeprazole (PRILOSEC) 40 MG capsule TAKE 1 CAPSULE BY MOUTH EVERY DAY 30 capsule 2  . risperiDONE (RISPERDAL) 1 MG tablet Take 1 tablet (1 mg total) by mouth at bedtime. 90 tablet 0  . traZODone (DESYREL) 100 MG tablet Take one tab at bed time 90 tablet 0  . lisinopril (PRINIVIL,ZESTRIL) 20 MG tablet Take 1 tablet (20 mg total) by mouth daily. 90 tablet 3  . lovastatin (MEVACOR) 20 MG tablet Take 1 tablet (20 mg total) by mouth at bedtime. 90 tablet 3  . propranolol (INDERAL) 40 MG  tablet Take 1 tablet (40 mg total) by mouth 2 (two) times daily. 180 tablet 3   No facility-administered medications prior to visit.     No Known Allergies  ROS Review of Systems  All other systems reviewed and are negative.     Objective:    Physical Exam  Constitutional: He is oriented to person, place, and time. He appears well-developed and well-nourished.  HENT:  Head: Normocephalic.  Eyes: Pupils are equal, round, and reactive to light. EOM are normal.  Neck: Normal range of motion. Neck supple.  Cardiovascular: Normal rate and regular rhythm.  Pulmonary/Chest: Effort normal and breath sounds normal.  Abdominal: Soft. Bowel sounds are normal. He exhibits distension.  Musculoskeletal: Normal range of motion.  Neurological: He is alert and oriented to person, place, and time.  Skin: Skin is warm and dry.  Psychiatric: He has a normal mood and affect.    BP  103/79 (BP Location: Right Arm, Patient Position: Sitting, Cuff Size: Large)   Pulse 79   Temp 97.8 F (36.6 C) (Tympanic)   Ht '5\' 11"'  (1.803 m)   Wt (!) 313 lb 6.4 oz (142.2 kg)   SpO2 96%   BMI 43.71 kg/m  Wt Readings from Last 3 Encounters:  07/12/19 (!) 313 lb 6.4 oz (142.2 kg)  01/10/19 (!) 327 lb (148.3 kg)  04/03/18 (!) 325 lb (147.4 kg)     There are no preventive care reminders to display for this patient.  There are no preventive care reminders to display for this patient.  Lab Results  Component Value Date   TSH 1.430 10/17/2017   Lab Results  Component Value Date   WBC 10.0 01/10/2019   HGB 14.6 01/10/2019   HCT 43.0 01/10/2019   MCV 84 01/10/2019   PLT 205 01/10/2019   Lab Results  Component Value Date   NA 139 01/10/2019   K 4.6 01/10/2019   CO2 20 01/10/2019   GLUCOSE 105 (H) 01/10/2019   BUN 12 01/10/2019   CREATININE 1.08 01/10/2019   BILITOT 0.4 01/10/2019   ALKPHOS 68 01/10/2019   AST 30 01/10/2019   ALT 42 01/10/2019   PROT 6.5 01/10/2019   ALBUMIN 4.1 01/10/2019   CALCIUM 9.2 01/10/2019   ANIONGAP 8 05/29/2017   Lab Results  Component Value Date   CHOL 218 (H) 01/10/2019   Lab Results  Component Value Date   HDL 34 (L) 01/10/2019   Lab Results  Component Value Date   LDLCALC 150 (H) 01/10/2019   Lab Results  Component Value Date   TRIG 169 (H) 01/10/2019   Lab Results  Component Value Date   CHOLHDL 6.4 (H) 01/10/2019   Lab Results  Component Value Date   HGBA1C 5.8 (A) 01/10/2019      Assessment & Plan:  Kaliq was seen today for follow-up.  Diagnoses and all orders for this visit:  Prediabetes Recommendations For Diabetic/Prediabetic Patients:   -   Exercise at least 5 times a week for 30 minutes or preferably daily.  - No Smoking - Drink less than 2 drinks a day.  - Monitor your feet for sores - Have yearly Eye Exams - Recommend annual Flu vaccine  - Recommend Pneumovax and Prevnar vaccines - Shingles  Vaccine (Zostavax) if over 76 y.o. Goals:   - BMI less than 24 - Fasting sugar less than 130 or less than 150 if tapering medicines to lose weight  - Systolic BP less than 256  - Diastolic BP  less than 80 - Bad LDL Cholesterol less than 70 - Triglycerides less than 150 -     CBC with Differential/Platelet; Future  Hypertension, unspecified type Blood pressure unremarkable discussed  low-sodium, DASH diet, medication compliance, 150 minutes of moderate intensity exercise per week. Discussed medication compliance, adverse effects. -     lisinopril (ZESTRIL) 20 MG tablet; Take 1 tablet (20 mg total) by mouth daily. -     propranolol (INDERAL) 40 MG tablet; Take 1 tablet (40 mg total) by mouth 2 (two) times daily. -     CBC with Differential/Platelet; Future -     CMP14+EGFR; Future -     Lipid panel; Future  Elevated lipoprotein(a)  Healthy lifestyle diet of fruits vegetables and decrease foods high in cholesterol or liver, fatty meats,cheese, butter avocados, nuts and seeds, chocolate and fried foods. -     lovastatin (MEVACOR) 20 MG tablet; Take 1 tablet (20 mg total) by mouth at bedtime. -     Lipid panel; Future  Generalized anxiety disorder  We discussed options for treatment of anxiety including therapy and/or medication.  Curtis check basic labs to ensure thyroid is in normal range and that no other metabolic issues are obvious.  Reviewed concept of anxiety as biochemical imbalance of neurotransmitters and rationale for treatment. Discussed potential risks, expected benefits, possible side effects of the medicine. We also discussed how to take it correctly and dosing instructions. If he has any significant side effects to the medicine, he is to stop it and call for advice.  Instructed patient to contact office or on-call physician promptly should condition worsen or any new symptoms appear.         Tremor -     propranolol (INDERAL) 40 MG tablet; Take 1 tablet (40 mg total) by  mouth 2 (two) times daily.  Tobacco use disorder Nicotine affect every organ in the body second leading cause of death.  Increased risk for lung cancer and other respiratory diseases recommend cessation.  This Curtis be reminded at each clinical visit.    Class 3 severe obesity without serious comorbidity with body mass index (BMI) of 40.0 to 44.9 in adult, unspecified obesity type (HCC) Obesity is 40- 49  indicating an excess in caloric intake or underlining conditions. This may lead to other co-morbidities. Lifestyle modifications of diet and exercise may reduce obesity.  Note patient is down 13 pounds since last visit 6 months ago. -     TSH + free T4; Future  Medication refill    Follow-up: No follow-ups on file.    Kerin Perna, NP

## 2019-07-12 NOTE — Patient Instructions (Signed)
° °Calorie Counting for Weight Loss °Calories are units of energy. Your body needs a certain amount of calories from food to keep you going throughout the day. When you eat more calories than your body needs, your body stores the extra calories as fat. When you eat fewer calories than your body needs, your body burns fat to get the energy it needs. °Calorie counting means keeping track of how many calories you eat and drink each day. Calorie counting can be helpful if you need to lose weight. If you make sure to eat fewer calories than your body needs, you should lose weight. Ask your health care provider what a healthy weight is for you. °For calorie counting to work, you will need to eat the right number of calories in a day in order to lose a healthy amount of weight per week. A dietitian can help you determine how many calories you need in a day and will give you suggestions on how to reach your calorie goal. °· A healthy amount of weight to lose per week is usually 1-2 lb (0.5-0.9 kg). This usually means that your daily calorie intake should be reduced by 500-750 calories. °· Eating 1,200 - 1,500 calories per day can help most women lose weight. °· Eating 1,500 - 1,800 calories per day can help most men lose weight. °What is my plan? °My goal is to have __________ calories per day. °If I have this many calories per day, I should lose around __________ pounds per week. °What do I need to know about calorie counting? °In order to meet your daily calorie goal, you will need to: °· Find out how many calories are in each food you would like to eat. Try to do this before you eat. °· Decide how much of the food you plan to eat. °· Write down what you ate and how many calories it had. Doing this is called keeping a food log. °To successfully lose weight, it is important to balance calorie counting with a healthy lifestyle that includes regular activity. Aim for 150 minutes of moderate exercise (such as walking) or 75  minutes of vigorous exercise (such as running) each week. °Where do I find calorie information? ° °The number of calories in a food can be found on a Nutrition Facts label. If a food does not have a Nutrition Facts label, try to look up the calories online or ask your dietitian for help. °Remember that calories are listed per serving. If you choose to have more than one serving of a food, you will have to multiply the calories per serving by the amount of servings you plan to eat. For example, the label on a package of bread might say that a serving size is 1 slice and that there are 90 calories in a serving. If you eat 1 slice, you will have eaten 90 calories. If you eat 2 slices, you will have eaten 180 calories. °How do I keep a food log? °Immediately after each meal, record the following information in your food log: °· What you ate. Don't forget to include toppings, sauces, and other extras on the food. °· How much you ate. This can be measured in cups, ounces, or number of items. °· How many calories each food and drink had. °· The total number of calories in the meal. °Keep your food log near you, such as in a small notebook in your pocket, or use a mobile app or website. Some programs will   calculate calories for you and show you how many calories you have left for the day to meet your goal. °What are some calorie counting tips? ° °· Use your calories on foods and drinks that will fill you up and not leave you hungry: °? Some examples of foods that fill you up are nuts and nut butters, vegetables, lean proteins, and high-fiber foods like whole grains. High-fiber foods are foods with more than 5 g fiber per serving. °? Drinks such as sodas, specialty coffee drinks, alcohol, and juices have a lot of calories, yet do not fill you up. °· Eat nutritious foods and avoid empty calories. Empty calories are calories you get from foods or beverages that do not have many vitamins or protein, such as candy, sweets, and  soda. It is better to have a nutritious high-calorie food (such as an avocado) than a food with few nutrients (such as a bag of chips). °· Know how many calories are in the foods you eat most often. This will help you calculate calorie counts faster. °· Pay attention to calories in drinks. Low-calorie drinks include water and unsweetened drinks. °· Pay attention to nutrition labels for "low fat" or "fat free" foods. These foods sometimes have the same amount of calories or more calories than the full fat versions. They also often have added sugar, starch, or salt, to make up for flavor that was removed with the fat. °· Find a way of tracking calories that works for you. Get creative. Try different apps or programs if writing down calories does not work for you. °What are some portion control tips? °· Know how many calories are in a serving. This will help you know how many servings of a certain food you can have. °· Use a measuring cup to measure serving sizes. You could also try weighing out portions on a kitchen scale. With time, you will be able to estimate serving sizes for some foods. °· Take some time to put servings of different foods on your favorite plates, bowls, and cups so you know what a serving looks like. °· Try not to eat straight from a bag or box. Doing this can lead to overeating. Put the amount you would like to eat in a cup or on a plate to make sure you are eating the right portion. °· Use smaller plates, glasses, and bowls to prevent overeating. °· Try not to multitask (for example, watch TV or use your computer) while eating. If it is time to eat, sit down at a table and enjoy your food. This will help you to know when you are full. It will also help you to be aware of what you are eating and how much you are eating. °What are tips for following this plan? °Reading food labels °· Check the calorie count compared to the serving size. The serving size may be smaller than what you are used to  eating. °· Check the source of the calories. Make sure the food you are eating is high in vitamins and protein and low in saturated and trans fats. °Shopping °· Read nutrition labels while you shop. This will help you make healthy decisions before you decide to purchase your food. °· Make a grocery list and stick to it. °Cooking °· Try to cook your favorite foods in a healthier way. For example, try baking instead of frying. °· Use low-fat dairy products. °Meal planning °· Use more fruits and vegetables. Half of your plate should be   fruits and vegetables. °· Include lean proteins like poultry and fish. °How do I count calories when eating out? °· Ask for smaller portion sizes. °· Consider sharing an entree and sides instead of getting your own entree. °· If you get your own entree, eat only half. Ask for a box at the beginning of your meal and put the rest of your entree in it so you are not tempted to eat it. °· If calories are listed on the menu, choose the lower calorie options. °· Choose dishes that include vegetables, fruits, whole grains, low-fat dairy products, and lean protein. °· Choose items that are boiled, broiled, grilled, or steamed. Stay away from items that are buttered, battered, fried, or served with cream sauce. Items labeled "crispy" are usually fried, unless stated otherwise. °· Choose water, low-fat milk, unsweetened iced tea, or other drinks without added sugar. If you want an alcoholic beverage, choose a lower calorie option such as a glass of wine or light beer. °· Ask for dressings, sauces, and syrups on the side. These are usually high in calories, so you should limit the amount you eat. °· If you want a salad, choose a garden salad and ask for grilled meats. Avoid extra toppings like bacon, cheese, or fried items. Ask for the dressing on the side, or ask for olive oil and vinegar or lemon to use as dressing. °· Estimate how many servings of a food you are given. For example, a serving of  cooked rice is ½ cup or about the size of half a baseball. Knowing serving sizes will help you be aware of how much food you are eating at restaurants. The list below tells you how big or small some common portion sizes are based on everyday objects: °? 1 oz--4 stacked dice. °? 3 oz--1 deck of cards. °? 1 tsp--1 die. °? 1 Tbsp--½ a ping-pong ball. °? 2 Tbsp--1 ping-pong ball. °? ½ cup--½ baseball. °? 1 cup--1 baseball. °Summary °· Calorie counting means keeping track of how many calories you eat and drink each day. If you eat fewer calories than your body needs, you should lose weight. °· A healthy amount of weight to lose per week is usually 1-2 lb (0.5-0.9 kg). This usually means reducing your daily calorie intake by 500-750 calories. °· The number of calories in a food can be found on a Nutrition Facts label. If a food does not have a Nutrition Facts label, try to look up the calories online or ask your dietitian for help. °· Use your calories on foods and drinks that will fill you up, and not on foods and drinks that will leave you hungry. °· Use smaller plates, glasses, and bowls to prevent overeating. °This information is not intended to replace advice given to you by your health care provider. Make sure you discuss any questions you have with your health care provider. °Document Released: 10/18/2005 Document Revised: 07/07/2018 Document Reviewed: 09/17/2016 °Elsevier Patient Education © 2020 [employer] ° °

## 2019-07-17 ENCOUNTER — Other Ambulatory Visit (INDEPENDENT_AMBULATORY_CARE_PROVIDER_SITE_OTHER): Payer: Medicaid Other

## 2019-08-28 ENCOUNTER — Other Ambulatory Visit (HOSPITAL_COMMUNITY): Payer: Self-pay | Admitting: Psychiatry

## 2019-08-28 DIAGNOSIS — F4310 Post-traumatic stress disorder, unspecified: Secondary | ICD-10-CM

## 2019-08-29 ENCOUNTER — Other Ambulatory Visit: Payer: Self-pay

## 2019-08-29 ENCOUNTER — Encounter (HOSPITAL_COMMUNITY): Payer: Self-pay | Admitting: Psychiatry

## 2019-08-29 ENCOUNTER — Ambulatory Visit (INDEPENDENT_AMBULATORY_CARE_PROVIDER_SITE_OTHER): Payer: Medicaid Other | Admitting: Psychiatry

## 2019-08-29 VITALS — Wt 308.0 lb

## 2019-08-29 DIAGNOSIS — F1210 Cannabis abuse, uncomplicated: Secondary | ICD-10-CM

## 2019-08-29 DIAGNOSIS — F411 Generalized anxiety disorder: Secondary | ICD-10-CM | POA: Diagnosis not present

## 2019-08-29 DIAGNOSIS — F319 Bipolar disorder, unspecified: Secondary | ICD-10-CM | POA: Diagnosis not present

## 2019-08-29 DIAGNOSIS — F4310 Post-traumatic stress disorder, unspecified: Secondary | ICD-10-CM | POA: Diagnosis not present

## 2019-08-29 DIAGNOSIS — F12188 Cannabis abuse with other cannabis-induced disorder: Secondary | ICD-10-CM

## 2019-08-29 MED ORDER — BUSPIRONE HCL 5 MG PO TABS: 5.0000 mg | ORAL_TABLET | Freq: Two times a day (BID) | ORAL | 0 refills | Status: DC

## 2019-08-29 MED ORDER — TRAZODONE HCL 100 MG PO TABS: ORAL_TABLET | ORAL | 0 refills | Status: DC

## 2019-08-29 MED ORDER — RISPERIDONE 1 MG PO TABS: 1.0000 mg | ORAL_TABLET | Freq: Every day | ORAL | 0 refills | Status: DC
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
## 2019-10-08 ENCOUNTER — Telehealth (INDEPENDENT_AMBULATORY_CARE_PROVIDER_SITE_OTHER): Payer: Self-pay

## 2019-10-08 ENCOUNTER — Telehealth (HOSPITAL_COMMUNITY): Payer: Self-pay

## 2019-10-08 NOTE — Telephone Encounter (Signed)
Patient called to make a medication refill for   omeprazole (PRILOSEC) 40 MG capsule   Patient uses   [id number] [address], [place]  Please advice [phone]

## 2019-10-08 NOTE — Telephone Encounter (Signed)
FWD to PCP. Marrietta Thunder S Elianys Conry, CMA  

## 2019-10-08 NOTE — Telephone Encounter (Signed)
I dot think we prescribe Vistaril. Hold refill until next appointment.

## 2019-10-08 NOTE — Telephone Encounter (Signed)
Medication refill request - fax from [facility]'s [facility] for a refill of his past prescribed Hydroxyzine.

## 2019-10-09 ENCOUNTER — Other Ambulatory Visit (INDEPENDENT_AMBULATORY_CARE_PROVIDER_SITE_OTHER): Payer: Self-pay | Admitting: Primary Care

## 2019-10-09 DIAGNOSIS — R12 Heartburn: Secondary | ICD-10-CM

## 2019-10-09 MED ORDER — OMEPRAZOLE 40 MG PO CPDR: DELAYED_RELEASE_CAPSULE | ORAL | 1 refills | Status: DC

## 2019-10-11 ENCOUNTER — Ambulatory Visit (INDEPENDENT_AMBULATORY_CARE_PROVIDER_SITE_OTHER): Payer: Medicaid Other | Admitting: Primary Care

## 2019-10-11 ENCOUNTER — Other Ambulatory Visit: Payer: Self-pay

## 2019-10-11 ENCOUNTER — Encounter (INDEPENDENT_AMBULATORY_CARE_PROVIDER_SITE_OTHER): Payer: Self-pay | Admitting: Primary Care

## 2019-10-11 DIAGNOSIS — I10 Essential (primary) hypertension: Secondary | ICD-10-CM

## 2019-10-11 DIAGNOSIS — R7303 Prediabetes: Secondary | ICD-10-CM | POA: Diagnosis not present

## 2019-10-11 NOTE — Progress Notes (Signed)
Virtual Visit via Telephone Note  I connected with [name] on 10/11/19 at  2:10 PM EST by telephone and verified that I am speaking with the correct person using two identifiers.   I discussed the limitations, risks, security and privacy concerns of performing an evaluation and management service by telephone and the availability of in person appointments. I also discussed with the patient that there may be a patient responsible charge related to this service. The patient expressed understanding and agreed to proceed.   History of Present Illness: [name] was having a tele visit concern that he had refills review chart PCP 6 months and explain behavioral health physician. Past Medical History:  Diagnosis Date  . Bipolar 1 disorder ([place])   . Hypertension 08/08/2017   Current Outpatient Medications on File Prior to Visit  Medication Sig Dispense Refill  . busPIRone (BUSPAR) 5 MG tablet Take 1 tablet (5 mg total) by mouth 2 (two) times daily. 180 tablet 0  . hydrOXYzine (ATARAX/VISTARIL) 25 MG tablet Take 25 mg by mouth as needed.    Marland Kitchen lisinopril (ZESTRIL) 20 MG tablet Take 1 tablet (20 mg total) by mouth daily. 90 tablet 3  . lovastatin (MEVACOR) 20 MG tablet Take 1 tablet (20 mg total) by mouth at bedtime. 30 tablet 0  . omeprazole (PRILOSEC) 40 MG capsule TAKE 1 CAPSULE BY MOUTH EVERY DAY 30 capsule 1  . propranolol (INDERAL) 40 MG tablet Take 1 tablet (40 mg total) by mouth 2 (two) times daily. 180 tablet 3  . risperiDONE (RISPERDAL) 1 MG tablet Take 1 tablet (1 mg total) by mouth at bedtime. 90 tablet 0  . traZODone (DESYREL) 100 MG tablet Take one tab at bed time 90 tablet 0   No current facility-administered medications on file prior to visit.     Observations/Objective: Review of Systems  All other systems reviewed and are negative.   Assessment and Plan: [name] was seen today for follow-up.  Diagnoses and all orders for this visit:  Hypertension, unspecified  type Counseled on blood pressure goal of less than 130/80, low-sodium, medication compliance, 150 minutes of moderate intensity exercise per week. - Systolic BP less than 389  - Diastolic BP less than 80  Prediabetes Last A1C 5.9 next one due March 2021 - BMI less than 24 - Fasting sugar less than 130 or less than 150 if tapering medicines to lose weight  - Bad LDL Cholesterol less than 70 - Triglycerides less than 150       Follow Up Instructions:    I discussed the assessment and treatment plan with the patient. The patient was provided an opportunity to ask questions and all were answered. The patient agreed with the plan and demonstrated an understanding of the instructions.   The patient was advised to call back or seek an in-person evaluation if the symptoms worsen or if the condition fails to improve as anticipated.  I provided 14 minutes of non-face-to-face time during this encounter.   Kerin Perna, NP

## 2019-11-29 ENCOUNTER — Other Ambulatory Visit: Payer: Self-pay

## 2019-11-29 ENCOUNTER — Ambulatory Visit (INDEPENDENT_AMBULATORY_CARE_PROVIDER_SITE_OTHER): Payer: Medicaid Other | Admitting: Psychiatry

## 2019-11-29 ENCOUNTER — Other Ambulatory Visit (INDEPENDENT_AMBULATORY_CARE_PROVIDER_SITE_OTHER): Payer: Self-pay | Admitting: Primary Care

## 2019-11-29 ENCOUNTER — Encounter (HOSPITAL_COMMUNITY): Payer: Self-pay | Admitting: Psychiatry

## 2019-11-29 VITALS — Wt 307.0 lb

## 2019-11-29 DIAGNOSIS — F319 Bipolar disorder, unspecified: Secondary | ICD-10-CM | POA: Diagnosis not present

## 2019-11-29 DIAGNOSIS — F1210 Cannabis abuse, uncomplicated: Secondary | ICD-10-CM

## 2019-11-29 DIAGNOSIS — F4310 Post-traumatic stress disorder, unspecified: Secondary | ICD-10-CM

## 2019-11-29 DIAGNOSIS — R12 Heartburn: Secondary | ICD-10-CM

## 2019-11-29 MED ORDER — RISPERIDONE 1 MG PO TABS: 1.0000 mg | ORAL_TABLET | Freq: Every day | ORAL | 0 refills | Status: DC

## 2019-11-29 MED ORDER — TRAZODONE HCL 100 MG PO TABS: ORAL_TABLET | ORAL | 0 refills | Status: DC

## 2019-11-29 MED ORDER — HYDROXYZINE HCL 25 MG PO TABS: 25.0000 mg | ORAL_TABLET | Freq: Two times a day (BID) | ORAL | 1 refills | Status: DC | PRN
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
## 2020-02-02 ENCOUNTER — Other Ambulatory Visit (INDEPENDENT_AMBULATORY_CARE_PROVIDER_SITE_OTHER): Payer: Self-pay | Admitting: Primary Care

## 2020-02-02 DIAGNOSIS — R12 Heartburn: Secondary | ICD-10-CM

## 2020-02-27 ENCOUNTER — Ambulatory Visit (INDEPENDENT_AMBULATORY_CARE_PROVIDER_SITE_OTHER): Payer: Medicaid Other | Admitting: Psychiatry

## 2020-02-27 ENCOUNTER — Other Ambulatory Visit: Payer: Self-pay

## 2020-02-27 ENCOUNTER — Encounter (HOSPITAL_COMMUNITY): Payer: Self-pay | Admitting: Psychiatry

## 2020-02-27 DIAGNOSIS — F319 Bipolar disorder, unspecified: Secondary | ICD-10-CM

## 2020-02-27 DIAGNOSIS — F4310 Post-traumatic stress disorder, unspecified: Secondary | ICD-10-CM

## 2020-02-27 MED ORDER — HYDROXYZINE HCL 25 MG PO TABS: 25.0000 mg | ORAL_TABLET | Freq: Two times a day (BID) | ORAL | 1 refills | Status: DC | PRN

## 2020-02-27 MED ORDER — TRAZODONE HCL 100 MG PO TABS: ORAL_TABLET | ORAL | 0 refills | Status: DC

## 2020-02-27 MED ORDER — RISPERIDONE 1 MG PO TABS: 1.0000 mg | ORAL_TABLET | Freq: Every day | ORAL | 0 refills | Status: DC
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
## 2020-03-26 ENCOUNTER — Other Ambulatory Visit (INDEPENDENT_AMBULATORY_CARE_PROVIDER_SITE_OTHER): Payer: Self-pay | Admitting: Primary Care

## 2020-03-26 ENCOUNTER — Other Ambulatory Visit (HOSPITAL_COMMUNITY): Payer: Self-pay | Admitting: Psychiatry

## 2020-03-26 DIAGNOSIS — F4310 Post-traumatic stress disorder, unspecified: Secondary | ICD-10-CM

## 2020-03-26 DIAGNOSIS — R12 Heartburn: Secondary | ICD-10-CM

## 2020-05-27 ENCOUNTER — Telehealth (INDEPENDENT_AMBULATORY_CARE_PROVIDER_SITE_OTHER): Payer: Medicaid Other | Admitting: Psychiatry

## 2020-05-27 ENCOUNTER — Other Ambulatory Visit: Payer: Self-pay

## 2020-05-27 VITALS — Wt 302.0 lb

## 2020-05-27 DIAGNOSIS — F1210 Cannabis abuse, uncomplicated: Secondary | ICD-10-CM | POA: Diagnosis not present

## 2020-05-27 DIAGNOSIS — F4310 Post-traumatic stress disorder, unspecified: Secondary | ICD-10-CM

## 2020-05-27 DIAGNOSIS — F319 Bipolar disorder, unspecified: Secondary | ICD-10-CM

## 2020-05-27 MED ORDER — TRAZODONE HCL 100 MG PO TABS: ORAL_TABLET | ORAL | 0 refills | Status: DC

## 2020-05-27 MED ORDER — RISPERIDONE 1 MG PO TABS: 1.0000 mg | ORAL_TABLET | Freq: Every day | ORAL | 0 refills | Status: DC

## 2020-05-27 MED ORDER — HYDROXYZINE HCL 25 MG PO TABS: 25.0000 mg | ORAL_TABLET | Freq: Every day | ORAL | 2 refills | Status: DC
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
## 2020-06-04 ENCOUNTER — Other Ambulatory Visit (INDEPENDENT_AMBULATORY_CARE_PROVIDER_SITE_OTHER): Payer: Self-pay | Admitting: Primary Care

## 2020-06-04 DIAGNOSIS — R12 Heartburn: Secondary | ICD-10-CM

## 2020-06-04 NOTE — Telephone Encounter (Signed)
Requested medication (s) are due for refill today- yes  Requested medication (s) are on the active medication list -yes  Future visit scheduled -no  Last refill: 04/29/20  Notes to clinic: patient had appointment 12/20 ( Rx not addressed)- Rx last RF 2 months ago with no mention of follow up- request sent for review  Requested Prescriptions  Pending Prescriptions Disp Refills   omeprazole (PRILOSEC) 40 MG capsule [Pharmacy Med Name: OMEPRAZOLE DR 40 MG CAPSULE] 30 capsule 1    Sig: TAKE 1 CAPSULE BY MOUTH EVERY DAY      Gastroenterology: Proton Pump Inhibitors Failed - 06/04/2020  1:48 PM      Failed - Valid encounter within last 12 months    Recent Outpatient Visits           7 months ago Hypertension, unspecified type   [facility] Grayce Sessions, NP   10 months ago Prediabetes   [facility] Grayce Sessions, NP   1 year ago Hyperglycemia   [facility] Grayce Sessions, NP   2 years ago Gastroesophageal reflux disease, esophagitis presence not specified   [facility] Loletta Specter, PA-C   2 years ago Bipolar I disorder [facility])   [facility] Loletta Specter, PA-C                  Requested Prescriptions  Pending Prescriptions Disp Refills   omeprazole (PRILOSEC) 40 MG capsule [Pharmacy Med Name: OMEPRAZOLE DR 40 MG CAPSULE] 30 capsule 1    Sig: TAKE 1 CAPSULE BY MOUTH EVERY DAY      Gastroenterology: Proton Pump Inhibitors Failed - 06/04/2020  1:48 PM      Failed - Valid encounter within last 12 months    Recent Outpatient Visits           7 months ago Hypertension, unspecified type   [facility] Grayce Sessions, NP   10 months ago Prediabetes   [facility] Grayce Sessions, NP   1 year ago Hyperglycemia   [facility] Grayce Sessions, NP   2 years ago  Gastroesophageal reflux disease, esophagitis presence not specified   [facility] Loletta Specter, PA-C   2 years ago Bipolar I disorder [facility])   [facility] Loletta Specter, PA-C

## 2020-07-03 ENCOUNTER — Other Ambulatory Visit (HOSPITAL_COMMUNITY): Payer: Self-pay | Admitting: Psychiatry

## 2020-07-03 ENCOUNTER — Other Ambulatory Visit (INDEPENDENT_AMBULATORY_CARE_PROVIDER_SITE_OTHER): Payer: Self-pay | Admitting: Family Medicine

## 2020-07-03 DIAGNOSIS — R12 Heartburn: Secondary | ICD-10-CM

## 2020-07-03 DIAGNOSIS — F319 Bipolar disorder, unspecified: Secondary | ICD-10-CM

## 2020-07-03 DIAGNOSIS — F4310 Post-traumatic stress disorder, unspecified: Secondary | ICD-10-CM

## 2020-07-19 ENCOUNTER — Other Ambulatory Visit (INDEPENDENT_AMBULATORY_CARE_PROVIDER_SITE_OTHER): Payer: Self-pay | Admitting: Primary Care

## 2020-07-19 DIAGNOSIS — F411 Generalized anxiety disorder: Secondary | ICD-10-CM

## 2020-07-19 DIAGNOSIS — R251 Tremor, unspecified: Secondary | ICD-10-CM

## 2020-07-19 DIAGNOSIS — I10 Essential (primary) hypertension: Secondary | ICD-10-CM

## 2020-07-19 NOTE — Telephone Encounter (Signed)
Requested Prescriptions  Pending Prescriptions Disp Refills  . propranolol (INDERAL) 40 MG tablet [Pharmacy Med Name: PROPRANOLOL 40 MG TABLET] 60 tablet 0    Sig: TAKE 1 TABLET BY MOUTH TWICE A DAY     Cardiovascular:  Beta Blockers Failed - 07/19/2020 11:04 AM      Failed - Valid encounter within last 6 months    Recent Outpatient Visits          9 months ago Hypertension, unspecified type   [facility] Grayce Sessions, NP   1 year ago Prediabetes   [facility] Grayce Sessions, NP   1 year ago Hyperglycemia   [facility] Grayce Sessions, NP   2 years ago Gastroesophageal reflux disease, esophagitis presence not specified   [facility] Loletta Specter, PA-C   2 years ago Bipolar I disorder [facility])   [facility] Loletta Specter, PA-C             Passed - Last BP in normal range    BP Readings from Last 1 Encounters:  07/12/19 103/79         Passed - Last Heart Rate in normal range    Pulse Readings from Last 1 Encounters:  07/12/19 79

## 2020-08-27 ENCOUNTER — Telehealth (HOSPITAL_COMMUNITY): Payer: Medicaid Other | Admitting: Psychiatry

## 2020-08-28 ENCOUNTER — Encounter (HOSPITAL_COMMUNITY): Payer: Self-pay | Admitting: Psychiatry

## 2020-08-28 ENCOUNTER — Telehealth (INDEPENDENT_AMBULATORY_CARE_PROVIDER_SITE_OTHER): Payer: Medicaid Other | Admitting: Psychiatry

## 2020-08-28 ENCOUNTER — Other Ambulatory Visit: Payer: Self-pay

## 2020-08-28 VITALS — Wt 292.0 lb

## 2020-08-28 DIAGNOSIS — F4310 Post-traumatic stress disorder, unspecified: Secondary | ICD-10-CM

## 2020-08-28 DIAGNOSIS — F319 Bipolar disorder, unspecified: Secondary | ICD-10-CM

## 2020-08-28 DIAGNOSIS — F1210 Cannabis abuse, uncomplicated: Secondary | ICD-10-CM | POA: Diagnosis not present

## 2020-08-28 MED ORDER — TRAZODONE HCL 100 MG PO TABS: ORAL_TABLET | ORAL | 0 refills | Status: DC

## 2020-08-28 MED ORDER — HYDROXYZINE HCL 25 MG PO TABS: 25.0000 mg | ORAL_TABLET | Freq: Every day | ORAL | 1 refills | Status: DC

## 2020-08-28 MED ORDER — RISPERIDONE 1 MG PO TABS: 1.0000 mg | ORAL_TABLET | Freq: Every day | ORAL | 0 refills | Status: DC
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
## 2020-09-22 ENCOUNTER — Other Ambulatory Visit (INDEPENDENT_AMBULATORY_CARE_PROVIDER_SITE_OTHER): Payer: Self-pay | Admitting: Primary Care

## 2020-09-22 ENCOUNTER — Other Ambulatory Visit (INDEPENDENT_AMBULATORY_CARE_PROVIDER_SITE_OTHER): Payer: Self-pay | Admitting: Family Medicine

## 2020-09-22 DIAGNOSIS — I10 Essential (primary) hypertension: Secondary | ICD-10-CM

## 2020-09-22 DIAGNOSIS — R251 Tremor, unspecified: Secondary | ICD-10-CM

## 2020-09-22 DIAGNOSIS — R12 Heartburn: Secondary | ICD-10-CM

## 2020-09-22 DIAGNOSIS — F411 Generalized anxiety disorder: Secondary | ICD-10-CM

## 2020-09-22 NOTE — Telephone Encounter (Signed)
Requested medication (s) are due for refill today: yes  Requested medication (s) are on the active medication list: yes  Last refill:  07/19/2020  Future visit scheduled: no  Overdue for follow up Vm left for patient to schedule    Requested Prescriptions  Pending Prescriptions Disp Refills   propranolol (INDERAL) 40 MG tablet [Pharmacy Med Name: PROPRANOLOL 40 MG TABLET] 60 tablet 0    Sig: TAKE 1 TABLET BY MOUTH TWICE A DAY      Cardiovascular:  Beta Blockers Failed - 09/22/2020 10:02 AM      Failed - Valid encounter within last 6 months    Recent Outpatient Visits           11 months ago Hypertension, unspecified type   [facility] Grayce Sessions, NP   1 year ago Prediabetes   [facility] Grayce Sessions, NP   1 year ago Hyperglycemia   [facility] Grayce Sessions, NP   2 years ago Gastroesophageal reflux disease, esophagitis presence not specified   [facility] Loletta Specter, PA-C   2 years ago Bipolar I disorder [facility])   [facility] Loletta Specter, PA-C              Passed - Last BP in normal range    BP Readings from Last 1 Encounters:  07/12/19 103/79          Passed - Last Heart Rate in normal range    Pulse Readings from Last 1 Encounters:  07/12/19 79

## 2020-10-06 ENCOUNTER — Ambulatory Visit (INDEPENDENT_AMBULATORY_CARE_PROVIDER_SITE_OTHER): Payer: Self-pay | Admitting: Primary Care

## 2020-10-13 ENCOUNTER — Ambulatory Visit (INDEPENDENT_AMBULATORY_CARE_PROVIDER_SITE_OTHER): Payer: Medicaid Other | Admitting: Primary Care

## 2020-10-28 ENCOUNTER — Ambulatory Visit (INDEPENDENT_AMBULATORY_CARE_PROVIDER_SITE_OTHER): Payer: Medicaid Other | Admitting: Primary Care

## 2020-11-20 ENCOUNTER — Other Ambulatory Visit (INDEPENDENT_AMBULATORY_CARE_PROVIDER_SITE_OTHER): Payer: Self-pay | Admitting: Primary Care

## 2020-11-20 ENCOUNTER — Telehealth (INDEPENDENT_AMBULATORY_CARE_PROVIDER_SITE_OTHER): Payer: Self-pay | Admitting: Primary Care

## 2020-11-20 DIAGNOSIS — R12 Heartburn: Secondary | ICD-10-CM

## 2020-11-20 DIAGNOSIS — R251 Tremor, unspecified: Secondary | ICD-10-CM

## 2020-11-20 DIAGNOSIS — I10 Essential (primary) hypertension: Secondary | ICD-10-CM

## 2020-11-20 DIAGNOSIS — F319 Bipolar disorder, unspecified: Secondary | ICD-10-CM

## 2020-11-20 DIAGNOSIS — F4310 Post-traumatic stress disorder, unspecified: Secondary | ICD-10-CM

## 2020-11-20 DIAGNOSIS — F411 Generalized anxiety disorder: Secondary | ICD-10-CM

## 2020-11-20 NOTE — Telephone Encounter (Signed)
See previous rx note

## 2020-11-20 NOTE — Telephone Encounter (Signed)
Copied from CRM [id number]. Topic: Quick Communication - Rx Refill/Question >> Nov 20, 2020  8:50 AM Jaquita Rector A wrote: Medication: hydrOXYzine (ATARAX/VISTARIL) 25 MG tablet, lisinopril (ZESTRIL) 20 MG tablet, propranolol (INDERAL) 40 MG tablet   Has the patient contacted their pharmacy? Yes.   (Agent: If no, request that the patient contact the pharmacy for the refill.) (Agent: If yes, when and what did the pharmacy advise?)  Preferred Pharmacy (with phone number or street name): [id number] [place], [place] - [address]  Phone:  [phone] Fax:  [phone]     Agent: Please be advised that RX refills may take up to 3 business days. We ask that you follow-up with your pharmacy.

## 2020-11-20 NOTE — Telephone Encounter (Signed)
Requested medications are due for refill today.  Yes  Requested medications are on the active medications list.  Yes  Last refill. 09/2020  Future visit scheduled.   No  Notes to clinic.  Pt has not been in for OV in nearly 2 years.  Courtesy refills have already been given.

## 2020-11-24 NOTE — Telephone Encounter (Signed)
Pt has scheduled soonest available appt, is completely out of his medications. Requesting refills as soon as possible   Best contact: [phone]

## 2020-11-25 ENCOUNTER — Telehealth (INDEPENDENT_AMBULATORY_CARE_PROVIDER_SITE_OTHER): Payer: Medicaid Other | Admitting: Psychiatry

## 2020-11-25 ENCOUNTER — Encounter (HOSPITAL_COMMUNITY): Payer: Self-pay | Admitting: Psychiatry

## 2020-11-25 ENCOUNTER — Other Ambulatory Visit: Payer: Self-pay

## 2020-11-25 ENCOUNTER — Telehealth (INDEPENDENT_AMBULATORY_CARE_PROVIDER_SITE_OTHER): Payer: Self-pay

## 2020-11-25 VITALS — Wt 292.0 lb

## 2020-11-25 DIAGNOSIS — F319 Bipolar disorder, unspecified: Secondary | ICD-10-CM

## 2020-11-25 DIAGNOSIS — F4310 Post-traumatic stress disorder, unspecified: Secondary | ICD-10-CM

## 2020-11-25 DIAGNOSIS — F1210 Cannabis abuse, uncomplicated: Secondary | ICD-10-CM | POA: Diagnosis not present

## 2020-11-25 MED ORDER — TRAZODONE HCL 100 MG PO TABS: ORAL_TABLET | ORAL | 0 refills | Status: DC

## 2020-11-25 MED ORDER — RISPERIDONE 1 MG PO TABS
1.0000 mg | ORAL_TABLET | Freq: Every day | ORAL | 0 refills | Status: DC
Start: 2020-11-25 — End: 2021-09-28

## 2020-11-25 MED ORDER — HYDROXYZINE HCL 25 MG PO TABS: 25.0000 mg | ORAL_TABLET | Freq: Every day | ORAL | 1 refills | Status: DC
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
## 2020-11-25 NOTE — Telephone Encounter (Signed)
Copied from CRM [id number]. Topic: General - Other >> Nov 25, 2020  9:11 AM Jaquita Rector A wrote: Reason for CRM: Patient called in asking Gwinda Passe for a refill just enough to get him to his appointment on 12/08/20 states its been 5 days since he last took this medication.   Please advise can be reached at Ph# [phone]

## 2020-11-25 NOTE — Telephone Encounter (Signed)
Patient is aware that refill has been denied. Courtesy given in November. Patient did not present to scheduled OV in December. Will have to present to appointment in February for refills.

## 2020-12-08 ENCOUNTER — Ambulatory Visit (INDEPENDENT_AMBULATORY_CARE_PROVIDER_SITE_OTHER): Payer: Medicaid Other | Admitting: Primary Care

## 2020-12-08 ENCOUNTER — Other Ambulatory Visit: Payer: Self-pay

## 2020-12-08 ENCOUNTER — Other Ambulatory Visit (INDEPENDENT_AMBULATORY_CARE_PROVIDER_SITE_OTHER): Payer: Self-pay | Admitting: Primary Care

## 2020-12-08 ENCOUNTER — Encounter (INDEPENDENT_AMBULATORY_CARE_PROVIDER_SITE_OTHER): Payer: Self-pay | Admitting: Primary Care

## 2020-12-08 VITALS — BP 123/83 | HR 79 | Temp 97.3°F | Ht 71.0 in | Wt 290.0 lb

## 2020-12-08 DIAGNOSIS — Z79899 Other long term (current) drug therapy: Secondary | ICD-10-CM | POA: Diagnosis not present

## 2020-12-08 DIAGNOSIS — R12 Heartburn: Secondary | ICD-10-CM

## 2020-12-08 DIAGNOSIS — I10 Essential (primary) hypertension: Secondary | ICD-10-CM | POA: Diagnosis not present

## 2020-12-08 DIAGNOSIS — R251 Tremor, unspecified: Secondary | ICD-10-CM

## 2020-12-08 DIAGNOSIS — R7303 Prediabetes: Secondary | ICD-10-CM | POA: Diagnosis not present

## 2020-12-08 DIAGNOSIS — F411 Generalized anxiety disorder: Secondary | ICD-10-CM

## 2020-12-08 DIAGNOSIS — E782 Mixed hyperlipidemia: Secondary | ICD-10-CM | POA: Diagnosis not present

## 2020-12-08 LAB — POCT GLYCOSYLATED HEMOGLOBIN (HGB A1C): Hemoglobin A1C: 5.8 % — AB (ref 4.0–5.6)

## 2020-12-08 MED ORDER — OMEPRAZOLE 40 MG PO CPDR: 40.0000 mg | DELAYED_RELEASE_CAPSULE | Freq: Every day | ORAL | 1 refills | Status: DC

## 2020-12-08 MED ORDER — LISINOPRIL 20 MG PO TABS: 20.0000 mg | ORAL_TABLET | Freq: Every day | ORAL | 1 refills | Status: DC

## 2020-12-09 LAB — CBC WITH DIFFERENTIAL/PLATELET
Basophils Absolute: 0.1 10*3/uL (ref 0.0–0.2)
Basos: 1 %
EOS (ABSOLUTE): 0.2 10*3/uL (ref 0.0–0.4)
Eos: 2 %
Hematocrit: 40 % (ref 37.5–51.0)
Hemoglobin: 13 g/dL (ref 13.0–17.7)
Immature Grans (Abs): 0 10*3/uL (ref 0.0–0.1)
Immature Granulocytes: 0 %
Lymphocytes Absolute: 1.8 10*3/uL (ref 0.7–3.1)
Lymphs: 23 %
MCH: 24.9 pg — ABNORMAL LOW (ref 26.6–33.0)
MCHC: 32.5 g/dL (ref 31.5–35.7)
MCV: 77 fL — ABNORMAL LOW (ref 79–97)
Monocytes Absolute: 0.9 10*3/uL (ref 0.1–0.9)
Monocytes: 11 %
Neutrophils Absolute: 5.2 10*3/uL (ref 1.4–7.0)
Neutrophils: 63 %
Platelets: 240 10*3/uL (ref 150–450)
RBC: 5.23 x10E6/uL (ref 4.14–5.80)
RDW: 15.4 % (ref 11.6–15.4)
WBC: 8.2 10*3/uL (ref 3.4–10.8)

## 2020-12-09 LAB — COMPREHENSIVE METABOLIC PANEL
ALT: 25 IU/L (ref 0–44)
AST: 19 IU/L (ref 0–40)
Albumin/Globulin Ratio: 1.8 (ref 1.2–2.2)
Albumin: 4.2 g/dL (ref 4.0–5.0)
Alkaline Phosphatase: 95 IU/L (ref 44–121)
BUN/Creatinine Ratio: 10 (ref 9–20)
BUN: 10 mg/dL (ref 6–24)
Bilirubin Total: 0.3 mg/dL (ref 0.0–1.2)
CO2: 24 mmol/L (ref 20–29)
Calcium: 9 mg/dL (ref 8.7–10.2)
Chloride: 103 mmol/L (ref 96–106)
Creatinine, Ser: 1.04 mg/dL (ref 0.76–1.27)
GFR calc Af Amer: 103 mL/min/{1.73_m2} (ref >59)
GFR calc non Af Amer: 89 mL/min/{1.73_m2} (ref >59)
Globulin, Total: 2.4 g/dL (ref 1.5–4.5)
Glucose: 89 mg/dL (ref 65–99)
Potassium: 4.5 mmol/L (ref 3.5–5.2)
Sodium: 138 mmol/L (ref 134–144)
Total Protein: 6.6 g/dL (ref 6.0–8.5)

## 2020-12-09 LAB — LIPID PANEL
Chol/HDL Ratio: 5.7 ratio — ABNORMAL HIGH (ref 0.0–5.0)
Cholesterol, Total: 210 mg/dL — ABNORMAL HIGH (ref 100–199)
HDL: 37 mg/dL — ABNORMAL LOW (ref >39)
LDL Chol Calc (NIH): 154 mg/dL — ABNORMAL HIGH (ref 0–99)
Triglycerides: 105 mg/dL (ref 0–149)
VLDL Cholesterol Cal: 19 mg/dL (ref 5–40)

## 2020-12-13 NOTE — Progress Notes (Signed)
Established Patient Office Visit  Subjective:  Patient ID: [name], male    DOB: 08-28-1981  Age: 40 y.o. MRN: [id number]  CC:  Chief Complaint  Patient presents with  . Hypertension       . Medication Refill    HPI [name] is a 40 year old male who presents for the management of hypertension-Denies shortness of breath, headaches, chest pain or lower extremity edema and requesting medication refills  Past Medical History:  Diagnosis Date  . Bipolar 1 disorder (HCC)   . Hypertension 08/08/2017    History reviewed. No pertinent surgical history.  Family History  Adopted: Yes  Family history unknown: Yes    Social History   Socioeconomic History  . Marital status: Single    Spouse name: Not on file  . Number of children: Not on file  . Years of education: Not on file  . Highest education level: Not on file  Occupational History  . Not on file  Tobacco Use  . Smoking status: Current Every Day Smoker    Packs/day: 0.50    Types: Cigarettes  . Smokeless tobacco: Never Used  . Tobacco comment: down to 4 cigarettes a day   Vaping Use  . Vaping Use: Never used  Substance and Sexual Activity  . Alcohol use: No  . Drug use: Yes    Types: Marijuana    Comment: daily  . Sexual activity: Yes    Birth control/protection: Condom  Other Topics Concern  . Not on file  Social History Narrative  . Not on file   Social Determinants of Health   Financial Resource Strain: Not on file  Food Insecurity: Not on file  Transportation Needs: Not on file  Physical Activity: Not on file  Stress: Not on file  Social Connections: Not on file  Intimate Partner Violence: Not on file    Outpatient Medications Prior to Visit  Medication Sig Dispense Refill  . hydrOXYzine (ATARAX/VISTARIL) 25 MG tablet Take 1 tablet (25 mg total) by mouth daily. 30 tablet 1  . lovastatin (MEVACOR) 20 MG tablet Take 1 tablet (20 mg total) by mouth at bedtime. 30 tablet 0  .  risperiDONE (RISPERDAL) 1 MG tablet Take 1 tablet (1 mg total) by mouth at bedtime. 90 tablet 0  . traZODone (DESYREL) 100 MG tablet Take one tab at bed time 90 tablet 0  . lisinopril (ZESTRIL) 20 MG tablet TAKE 1 TABLET BY MOUTH EVERY DAY 60 tablet 0  . omeprazole (PRILOSEC) 40 MG capsule TAKE 1 CAPSULE BY MOUTH EVERY DAY 90 capsule 0  . propranolol (INDERAL) 40 MG tablet TAKE 1 TABLET BY MOUTH TWICE A DAY 60 tablet 0   No facility-administered medications prior to visit.    No Known Allergies  ROS Review of Systems  All other systems reviewed and are negative.     Objective:    Physical Exam Vitals reviewed.  Constitutional:      Appearance: He is obese.  HENT:     Head: Normocephalic.     Right Ear: Tympanic membrane normal.     Left Ear: Tympanic membrane normal.     Nose: Nose normal.  Cardiovascular:     Rate and Rhythm: Normal rate and regular rhythm.  Pulmonary:     Effort: Pulmonary effort is normal.     Breath sounds: Normal breath sounds.  Musculoskeletal:        General: Normal range of motion.     Cervical back:  Normal range of motion and neck supple.  Skin:    General: Skin is warm and dry.  Neurological:     Mental Status: He is alert and oriented to person, place, and time.  Psychiatric:        Behavior: Behavior normal.     BP 123/83 (BP Location: Left Arm, Patient Position: Sitting, Cuff Size: Large)   Pulse 79   Temp (!) 97.3 F (36.3 C) (Temporal)   Ht 5\' 11"  (1.803 m)   Wt 290 lb (131.5 kg)   SpO2 96%   BMI 40.45 kg/m  Wt Readings from Last 3 Encounters:  12/08/20 290 lb (131.5 kg)  07/12/19 (!) 313 lb 6.4 oz (142.2 kg)  01/10/19 (!) 327 lb (148.3 kg)     Health Maintenance Due  Topic Date Due  . COVID-19 Vaccine (1) Never done    There are no preventive care reminders to display for this patient.  Lab Results  Component Value Date   TSH 1.430 10/17/2017   Lab Results  Component Value Date   WBC 8.2 12/08/2020   HGB 13.0  12/08/2020   HCT 40.0 12/08/2020   MCV 77 (L) 12/08/2020   PLT 240 12/08/2020   Lab Results  Component Value Date   NA 138 12/08/2020   K 4.5 12/08/2020   CO2 24 12/08/2020   GLUCOSE 89 12/08/2020   BUN 10 12/08/2020   CREATININE 1.04 12/08/2020   BILITOT 0.3 12/08/2020   ALKPHOS 95 12/08/2020   AST 19 12/08/2020   ALT 25 12/08/2020   PROT 6.6 12/08/2020   ALBUMIN 4.2 12/08/2020   CALCIUM 9.0 12/08/2020   ANIONGAP 8 05/29/2017   Lab Results  Component Value Date   CHOL 210 (H) 12/08/2020   Lab Results  Component Value Date   HDL 37 (L) 12/08/2020   Lab Results  Component Value Date   LDLCALC 154 (H) 12/08/2020   Lab Results  Component Value Date   TRIG 105 12/08/2020   Lab Results  Component Value Date   CHOLHDL 5.7 (H) 12/08/2020   Lab Results  Component Value Date   HGBA1C 5.8 (A) 12/08/2020      Assessment & Plan:  Abrahim was seen today for hypertension and medication refill.  Diagnoses and all orders for this visit:  High risk medication use -     Comprehensive metabolic panel  Hypertension, unspecified type blood pressure is at goal of less than 130/80, low-sodium, DASH diet, medication compliance, 150 minutes of moderate intensity exercise per week. Discussed medication compliance, adverse effects. -     lisinopril (ZESTRIL) 20 MG tablet; Take 1 tablet (20 mg total) by mouth daily.  Heartburn Discussed eating small frequent meal, reduction in acidic foods, fried foods ,spicy foods, alcohol caffeine and tobacco and certain medications. Avoid laying down after eating 19mins-1hour, elevated head of the bed.  -     omeprazole (PRILOSEC) 40 MG capsule; Take 1 capsule (40 mg total) by mouth daily.  Prediabetes -     CBC with Differential -     HgB A1c 5.8 remains the same for 1 year   Mixed hyperlipidemia Monitor and  lower your cholesterol  you can decrease your fatty foods, red meat, cheese, milk and increase fiber like whole grains and veggies.  You can also add a fiber supplement like Metamucil or Benefiber.  -     Lipid Panel     Meds ordered this encounter  Medications  . lisinopril (ZESTRIL) 20 MG  tablet    Sig: Take 1 tablet (20 mg total) by mouth daily.    Dispense:  90 tablet    Refill:  1    Enough until appointment  . omeprazole (PRILOSEC) 40 MG capsule    Sig: Take 1 capsule (40 mg total) by mouth daily.    Dispense:  90 capsule    Refill:  1    Follow-up: Return in about 6 months (around 06/07/2021) for Bp f/u person.    Grayce Sessions, NP

## 2020-12-18 ENCOUNTER — Other Ambulatory Visit (INDEPENDENT_AMBULATORY_CARE_PROVIDER_SITE_OTHER): Payer: Self-pay | Admitting: Primary Care

## 2020-12-18 DIAGNOSIS — E782 Mixed hyperlipidemia: Secondary | ICD-10-CM

## 2020-12-18 MED ORDER — ATORVASTATIN CALCIUM 40 MG PO TABS: 80.0000 mg | ORAL_TABLET | Freq: Every day | ORAL | 1 refills | Status: DC

## 2020-12-22 ENCOUNTER — Telehealth (INDEPENDENT_AMBULATORY_CARE_PROVIDER_SITE_OTHER): Payer: Self-pay

## 2020-12-22 NOTE — Telephone Encounter (Signed)
-----   Message from Grayce Sessions, NP sent at 12/18/2020  2:16 PM EST ----- Elevated cholesterol that can lead to heart attack and stroke. To lower your number you can decrease your fatty foods, red meat, cheese, milk and increase fiber like whole grains and veggies.  Prescribed atorvastatin 40mg  take at bedtime

## 2020-12-22 NOTE — Telephone Encounter (Signed)
Patient returned call. He is aware that cholesterol is elevated which can lead to stroke and heart attack. Advised patient on dietary changes. He is aware that atorvastatin has been sent and to take nightly at bedtime.he verbalized understanding. Maryjean Morn, CMA

## 2021-02-19 ENCOUNTER — Other Ambulatory Visit: Payer: Self-pay

## 2021-02-19 ENCOUNTER — Telehealth (HOSPITAL_COMMUNITY): Payer: Medicaid Other | Admitting: Psychiatry

## 2021-06-08 ENCOUNTER — Ambulatory Visit (INDEPENDENT_AMBULATORY_CARE_PROVIDER_SITE_OTHER): Payer: Medicaid Other | Admitting: Nurse Practitioner

## 2021-06-08 ENCOUNTER — Other Ambulatory Visit (INDEPENDENT_AMBULATORY_CARE_PROVIDER_SITE_OTHER): Payer: Self-pay | Admitting: Primary Care

## 2021-06-08 ENCOUNTER — Ambulatory Visit (INDEPENDENT_AMBULATORY_CARE_PROVIDER_SITE_OTHER): Payer: Medicaid Other | Admitting: Primary Care

## 2021-06-08 DIAGNOSIS — F411 Generalized anxiety disorder: Secondary | ICD-10-CM

## 2021-06-08 DIAGNOSIS — R251 Tremor, unspecified: Secondary | ICD-10-CM

## 2021-06-08 DIAGNOSIS — I10 Essential (primary) hypertension: Secondary | ICD-10-CM

## 2021-06-08 NOTE — Telephone Encounter (Signed)
Pt called back to report that he is completely out of his current supply, please advise  

## 2021-06-08 NOTE — Telephone Encounter (Signed)
Sent to PCP to refill if appropriate.  

## 2021-06-08 NOTE — Telephone Encounter (Signed)
Notes to clinic:  Patient has appt on 06/24/2021 Review for refill    Requested Prescriptions  Pending Prescriptions Disp Refills   lisinopril (ZESTRIL) 20 MG tablet [Pharmacy Med Name: LISINOPRIL 20 MG TABLET] 90 tablet 1    Sig: TAKE 1 TABLET BY MOUTH EVERY DAY      Cardiovascular:  ACE Inhibitors Failed - 06/08/2021 11:09 AM      Failed - Cr in normal range and within 180 days    Creatinine, Ser  Date Value Ref Range Status  12/08/2020 1.04 0.76 - 1.27 mg/dL Final          Failed - K in normal range and within 180 days    Potassium  Date Value Ref Range Status  12/08/2020 4.5 3.5 - 5.2 mmol/L Final          Failed - Valid encounter within last 6 months    Recent Outpatient Visits           6 months ago High risk medication use   [facility] Grayce Sessions, NP   1 year ago Hypertension, unspecified type   [facility] Grayce Sessions, NP   1 year ago Prediabetes   [facility] Grayce Sessions, NP   2 years ago Hyperglycemia   [facility] Grayce Sessions, NP   3 years ago Gastroesophageal reflux disease, esophagitis presence not specified   [facility] Loletta Specter, PA-C       Future Appointments             In 2 weeks Grayce Sessions, NP [facility]             Passed - Patient is not pregnant      Passed - Last BP in normal range    BP Readings from Last 1 Encounters:  12/08/20 123/83            propranolol (INDERAL) 40 MG tablet [Pharmacy Med Name: PROPRANOLOL 40 MG TABLET] 60 tablet 0    Sig: TAKE 1 TABLET BY MOUTH TWICE A DAY      Cardiovascular:  Beta Blockers Failed - 06/08/2021 11:09 AM      Failed - Valid encounter within last 6 months    Recent Outpatient Visits           6 months ago High risk medication use   [facility] Grayce Sessions, NP   1 year  ago Hypertension, unspecified type   [facility] Grayce Sessions, NP   1 year ago Prediabetes   [facility] Grayce Sessions, NP   2 years ago Hyperglycemia   [facility] Grayce Sessions, NP   3 years ago Gastroesophageal reflux disease, esophagitis presence not specified   [facility] Loletta Specter, PA-C       Future Appointments             In 2 weeks Grayce Sessions, NP [facility]             Passed - Last BP in normal range    BP Readings from Last 1 Encounters:  12/08/20 123/83          Passed - Last Heart Rate in normal range    Pulse Readings from Last 1  Encounters:  12/08/20 79

## 2021-06-24 ENCOUNTER — Ambulatory Visit (INDEPENDENT_AMBULATORY_CARE_PROVIDER_SITE_OTHER): Payer: Medicaid Other | Admitting: Primary Care

## 2021-07-23 ENCOUNTER — Other Ambulatory Visit (INDEPENDENT_AMBULATORY_CARE_PROVIDER_SITE_OTHER): Payer: Self-pay | Admitting: Primary Care

## 2021-07-23 ENCOUNTER — Other Ambulatory Visit (INDEPENDENT_AMBULATORY_CARE_PROVIDER_SITE_OTHER): Payer: Self-pay | Admitting: Family Medicine

## 2021-07-23 DIAGNOSIS — I10 Essential (primary) hypertension: Secondary | ICD-10-CM

## 2021-07-23 DIAGNOSIS — R12 Heartburn: Secondary | ICD-10-CM

## 2021-07-23 NOTE — Telephone Encounter (Signed)
Pt. Has appointment 08/13/21.

## 2021-07-23 NOTE — Telephone Encounter (Signed)
Sent to PCP ?

## 2021-08-13 ENCOUNTER — Ambulatory Visit (INDEPENDENT_AMBULATORY_CARE_PROVIDER_SITE_OTHER): Payer: Medicaid Other | Admitting: Primary Care

## 2021-08-31 ENCOUNTER — Ambulatory Visit (INDEPENDENT_AMBULATORY_CARE_PROVIDER_SITE_OTHER): Payer: Medicaid Other | Admitting: Primary Care

## 2021-09-26 ENCOUNTER — Emergency Department (HOSPITAL_COMMUNITY): Payer: Medicaid Other

## 2021-09-26 ENCOUNTER — Other Ambulatory Visit: Payer: Self-pay

## 2021-09-26 ENCOUNTER — Encounter (HOSPITAL_COMMUNITY): Payer: Self-pay

## 2021-09-26 ENCOUNTER — Other Ambulatory Visit (HOSPITAL_COMMUNITY)
Admission: EM | Admit: 2021-09-26 | Discharge: 2021-09-30 | Disposition: A | Discharge status: Home / Self Care | Payer: Medicaid Other | Source: Home / Self Care | Attending: Psychiatry | Admitting: Psychiatry

## 2021-09-26 ENCOUNTER — Ambulatory Visit (HOSPITAL_COMMUNITY)
Admission: EM | Admit: 2021-09-26 | Discharge: 2021-09-26 | Disposition: A | Discharge status: Other Acute Inpatient Hospital | Payer: Medicaid Other | Source: Home / Self Care

## 2021-09-26 ENCOUNTER — Encounter (HOSPITAL_COMMUNITY): Payer: Self-pay | Admitting: Behavioral Health

## 2021-09-26 ENCOUNTER — Emergency Department (HOSPITAL_COMMUNITY)
Admission: EM | Admit: 2021-09-26 | Discharge: 2021-09-26 | Disposition: A | Discharge status: Home / Self Care | Payer: Medicaid Other | Attending: Emergency Medicine | Admitting: Emergency Medicine

## 2021-09-26 DIAGNOSIS — Z9114 Patient's other noncompliance with medication regimen: Secondary | ICD-10-CM | POA: Insufficient documentation

## 2021-09-26 DIAGNOSIS — F1994 Other psychoactive substance use, unspecified with psychoactive substance-induced mood disorder: Secondary | ICD-10-CM

## 2021-09-26 DIAGNOSIS — R079 Chest pain, unspecified: Secondary | ICD-10-CM | POA: Diagnosis not present

## 2021-09-26 DIAGNOSIS — F17210 Nicotine dependence, cigarettes, uncomplicated: Secondary | ICD-10-CM | POA: Insufficient documentation

## 2021-09-26 DIAGNOSIS — F1290 Cannabis use, unspecified, uncomplicated: Secondary | ICD-10-CM | POA: Diagnosis not present

## 2021-09-26 DIAGNOSIS — F32A Depression, unspecified: Secondary | ICD-10-CM | POA: Insufficient documentation

## 2021-09-26 DIAGNOSIS — F5105 Insomnia due to other mental disorder: Secondary | ICD-10-CM

## 2021-09-26 DIAGNOSIS — M79601 Pain in right arm: Secondary | ICD-10-CM | POA: Insufficient documentation

## 2021-09-26 DIAGNOSIS — F1414 Cocaine abuse with cocaine-induced mood disorder: Secondary | ICD-10-CM | POA: Insufficient documentation

## 2021-09-26 DIAGNOSIS — R0789 Other chest pain: Secondary | ICD-10-CM | POA: Diagnosis not present

## 2021-09-26 DIAGNOSIS — F319 Bipolar disorder, unspecified: Secondary | ICD-10-CM

## 2021-09-26 DIAGNOSIS — Z79899 Other long term (current) drug therapy: Secondary | ICD-10-CM | POA: Insufficient documentation

## 2021-09-26 DIAGNOSIS — F3132 Bipolar disorder, current episode depressed, moderate: Secondary | ICD-10-CM

## 2021-09-26 DIAGNOSIS — F1410 Cocaine abuse, uncomplicated: Secondary | ICD-10-CM

## 2021-09-26 DIAGNOSIS — R062 Wheezing: Secondary | ICD-10-CM | POA: Diagnosis not present

## 2021-09-26 DIAGNOSIS — I10 Essential (primary) hypertension: Secondary | ICD-10-CM | POA: Insufficient documentation

## 2021-09-26 DIAGNOSIS — F259 Schizoaffective disorder, unspecified: Secondary | ICD-10-CM | POA: Insufficient documentation

## 2021-09-26 DIAGNOSIS — Z20822 Contact with and (suspected) exposure to covid-19: Secondary | ICD-10-CM | POA: Diagnosis not present

## 2021-09-26 DIAGNOSIS — E782 Mixed hyperlipidemia: Secondary | ICD-10-CM

## 2021-09-26 LAB — RESP PANEL BY RT-PCR (FLU A&B, COVID) ARPGX2
Influenza A by PCR: NEGATIVE
Influenza B by PCR: NEGATIVE
SARS Coronavirus 2 by RT PCR: NEGATIVE

## 2021-09-26 LAB — POC SARS CORONAVIRUS 2 AG -  ED: SARS Coronavirus 2 Ag: NEGATIVE

## 2021-09-26 LAB — HEPATIC FUNCTION PANEL
ALT: 35 U/L (ref 0–44)
AST: 27 U/L (ref 15–41)
Albumin: 3.8 g/dL (ref 3.5–5.0)
Alkaline Phosphatase: 70 U/L (ref 38–126)
Bilirubin, Direct: <0.1 mg/dL (ref 0.0–0.2)
Total Bilirubin: 0.4 mg/dL (ref 0.3–1.2)
Total Protein: 6.6 g/dL (ref 6.5–8.1)

## 2021-09-26 LAB — LIPID PANEL
Cholesterol: 187 mg/dL (ref 0–200)
HDL: 38 mg/dL — ABNORMAL LOW (ref >40)
LDL Cholesterol: 130 mg/dL — ABNORMAL HIGH (ref 0–99)
Total CHOL/HDL Ratio: 4.9 RATIO
Triglycerides: 96 mg/dL (ref <150)
VLDL: 19 mg/dL (ref 0–40)

## 2021-09-26 LAB — POCT URINE DRUG SCREEN - MANUAL ENTRY (I-SCREEN)
POC Amphetamine UR: NOT DETECTED
POC Buprenorphine (BUP): NOT DETECTED
POC Cocaine UR: POSITIVE — AB
POC Marijuana UR: POSITIVE — AB
POC Methadone UR: NOT DETECTED
POC Methamphetamine UR: NOT DETECTED
POC Morphine: NOT DETECTED
POC Oxazepam (BZO): NOT DETECTED
POC Oxycodone UR: NOT DETECTED
POC Secobarbital (BAR): NOT DETECTED

## 2021-09-26 LAB — BASIC METABOLIC PANEL
Anion gap: 7 (ref 5–15)
BUN: 14 mg/dL (ref 6–20)
CO2: 26 mmol/L (ref 22–32)
Calcium: 9.2 mg/dL (ref 8.9–10.3)
Chloride: 103 mmol/L (ref 98–111)
Creatinine, Ser: 1.04 mg/dL (ref 0.61–1.24)
GFR, Estimated: >60 mL/min (ref >60)
Glucose, Bld: 93 mg/dL (ref 70–99)
Potassium: 4.2 mmol/L (ref 3.5–5.1)
Sodium: 136 mmol/L (ref 135–145)

## 2021-09-26 LAB — CBC
HCT: 45.1 % (ref 39.0–52.0)
Hemoglobin: 13.7 g/dL (ref 13.0–17.0)
MCH: 24.6 pg — ABNORMAL LOW (ref 26.0–34.0)
MCHC: 30.4 g/dL (ref 30.0–36.0)
MCV: 81.1 fL (ref 80.0–100.0)
Platelets: 207 10*3/uL (ref 150–400)
RBC: 5.56 MIL/uL (ref 4.22–5.81)
RDW: 15.7 % — ABNORMAL HIGH (ref 11.5–15.5)
WBC: 9.4 10*3/uL (ref 4.0–10.5)
nRBC: 0 % (ref 0.0–0.2)

## 2021-09-26 LAB — TSH: TSH: 1.151 u[IU]/mL (ref 0.350–4.500)

## 2021-09-26 LAB — ETHANOL: Alcohol, Ethyl (B): <10 mg/dL (ref <10)

## 2021-09-26 LAB — TROPONIN I (HIGH SENSITIVITY): Troponin I (High Sensitivity): 3 ng/L (ref <18)

## 2021-09-26 MED ORDER — ATORVASTATIN CALCIUM 40 MG PO TABS
80.0000 mg | ORAL_TABLET | Freq: Every day | ORAL | Status: DC
  Administered 2021-09-26 – 2021-09-30 (×5): 80 mg via ORAL
  Filled 2021-09-26: qty 14
  Filled 2021-09-26 (×5): qty 2

## 2021-09-26 MED ORDER — RISPERIDONE 1 MG PO TABS
1.0000 mg | ORAL_TABLET | Freq: Every day | ORAL | Status: DC
  Administered 2021-09-26 – 2021-09-29 (×4): 1 mg via ORAL
  Filled 2021-09-26: qty 1
  Filled 2021-09-26: qty 14
  Filled 2021-09-26 (×3): qty 1

## 2021-09-26 MED ORDER — LISINOPRIL 10 MG PO TABS
10.0000 mg | ORAL_TABLET | Freq: Every day | ORAL | Status: DC
  Administered 2021-09-26 – 2021-09-30 (×5): 10 mg via ORAL
  Filled 2021-09-26: qty 14
  Filled 2021-09-26 (×5): qty 1

## 2021-09-26 MED ORDER — TRAZODONE HCL 50 MG PO TABS
50.0000 mg | ORAL_TABLET | Freq: Every evening | ORAL | Status: DC | PRN
  Filled 2021-09-26: qty 14

## 2021-09-26 MED ORDER — ALBUTEROL SULFATE HFA 108 (90 BASE) MCG/ACT IN AERS
2.0000 | INHALATION_SPRAY | RESPIRATORY_TRACT | Status: DC | PRN
  Administered 2021-09-29 (×2): 2 via RESPIRATORY_TRACT
  Filled 2021-09-26 (×3): qty 8

## 2021-09-26 MED ORDER — ALUM & MAG HYDROXIDE-SIMETH 200-200-20 MG/5ML PO SUSP: 30.0000 mL | ORAL | Status: DC | PRN

## 2021-09-26 MED ORDER — PROPRANOLOL HCL 20 MG PO TABS: 20.0000 mg | ORAL_TABLET | Freq: Two times a day (BID) | ORAL | Status: DC

## 2021-09-26 MED ORDER — HYDROXYZINE HCL 25 MG PO TABS
25.0000 mg | ORAL_TABLET | Freq: Three times a day (TID) | ORAL | Status: DC | PRN
  Filled 2021-09-26: qty 20

## 2021-09-26 MED ORDER — ALBUTEROL SULFATE HFA 108 (90 BASE) MCG/ACT IN AERS
2.0000 | INHALATION_SPRAY | Freq: Once | RESPIRATORY_TRACT | Status: AC
  Administered 2021-09-26: 2 via RESPIRATORY_TRACT
  Filled 2021-09-26: qty 6.7

## 2021-09-26 MED ORDER — ALBUTEROL SULFATE HFA 108 (90 BASE) MCG/ACT IN AERS: 2.0000 | INHALATION_SPRAY | RESPIRATORY_TRACT | 0 refills | Status: DC | PRN

## 2021-09-26 MED ORDER — ACETAMINOPHEN 325 MG PO TABS: 650.0000 mg | ORAL_TABLET | Freq: Four times a day (QID) | ORAL | Status: DC | PRN

## 2021-09-26 MED ORDER — PROPRANOLOL HCL 10 MG PO TABS
10.0000 mg | ORAL_TABLET | Freq: Two times a day (BID) | ORAL | Status: DC
  Administered 2021-09-26 – 2021-09-30 (×8): 10 mg via ORAL
  Filled 2021-09-26: qty 28
  Filled 2021-09-26 (×9): qty 1

## 2021-09-26 MED ORDER — MAGNESIUM HYDROXIDE 400 MG/5ML PO SUSP: 30.0000 mL | Freq: Every day | ORAL | Status: DC | PRN

## 2021-09-26 NOTE — ED Notes (Signed)
Chest pain for 1-2 weeks  yesterday the pain was more severe with lt arm numbness.  He reports being depressed also   a and o x 4

## 2021-09-26 NOTE — ED Provider Notes (Signed)
Behavioral Health Urgent Care Medical Screening Exam  Patient Name: [name] MRN: [id number] Date of Evaluation: 09/26/21 Diagnosis:  Final diagnoses:  Substance induced mood disorder (HCC)  Cocaine abuse (HCC)    History of Present illness: [name] is a 40 y.o. male patient presents to the [facility] voluntarily as a walk-in unaccompanied with a chief complaint of "cocaine use and depression."   Patient seen and evaluated face-to-face by this provider, chart reviewed and case discussed with Dr. Gasper Sells. On evaluation, patient is alert and oriented x4. His thought process is logical and speech is clear and coherent. He is calm, cooperative and does not appear to be in distress.   Patient reports that he recently relapsed from cocaine two months ago after being clean for three years. He reports using cocaine everyday, on average "over a gram" per day for the past two months. He reports that he last used cocaine last night. He reports smoking marijuana daily, on average $20 worth since age 2. He denies drinking alcohol. He states that he has attended substance abuse treatment in the past at [facility] in 2009 and at [facility] in 2010.   He reports feeling increasingly depressed for the past two months after his mother passed away. He describes his depressive symptoms as decreased appetite, weight loss-56 lbs in last month, poor sleep (1.5 to 2 hours), sadness, crying spells and irritability. He denies suicidal and homicidal ideations. He denies VH. He endorses chronic AH and describes it as "chatter." He does not appear objectively to be responding to internal or external stimuli. Patient has a reported hx of schizoaffective dx.   He reports that he resides in a boarding house with roommates. He states that he is currently on disability. He states that he follow up with [facility] for medication management. He states that he is prescribed  lisinopril, propranolol, Abilify and Risperdal.    Patient c/o intermittent right sided chest pain x 2 weeks. He describes the pain as sharp pains that shoots down his right arm. He rates his pain currently 7/10 (10 being worst). He denies dizziness, SOB or LE edema. Patient states that he has been off his antihypertensive medications for the past two weeks because he has not been able to get in contact with this PCP for a refill.   Psychiatric Specialt Exam  Presentation  General Appearance:Appropriate for Environment  Eye Contact:Fair  Speech:Clear and Coherent  Speech Volume:Normal  Handedness:No data recorded  Mood and Affect  Mood:Dysphoric  Affect:Congruent   Thought Process  Thought Processes:Coherent  Descriptions of Associations:Intact  Orientation:Full (Time, Place and Person)  Thought Content:Logical  Diagnosis of Schizophrenia or Schizoaffective disorder in past: Yes  Duration of Psychotic Symptoms: Greater than six months  Hallucinations:Auditory "Chatter"  Ideas of Reference:None  Suicidal Thoughts:No  Homicidal Thoughts:No   Sensorium  Memory:Immediate Fair; Recent Fair; Remote Fair  Judgment:Fair  Insight:Fair   Executive Functions  Concentration:Fair  Attention Span:Fair  Recall:Fair  Fund of Knowledge:Fair  Language:Fair   Psychomotor Activity  Psychomotor Activity:Normal   Assets  Assets:Communication Skills; Desire for Improvement; Financial Resources/Insurance; Housing; Leisure Time; Physical Health; Social Support   Sleep  Sleep:Poor  Number of hours: 2  Physical Exam: Physical Exam HENT:     Head: Normocephalic.     Nose: Nose normal.  Eyes:     Conjunctiva/sclera: Conjunctivae normal.  Cardiovascular:     Rate and Rhythm: Normal rate.     Comments: Hypertensive  Pulmonary:     Effort: Pulmonary effort is normal.  Musculoskeletal:        General: Normal range of motion.     Cervical back: Normal range of  motion.  Neurological:     Mental Status: He is alert and oriented to person, place, and time.   Review of Systems  Constitutional: Negative.   HENT: Negative.    Eyes: Negative.   Respiratory: Negative.    Cardiovascular:  Positive for chest pain.  Gastrointestinal: Negative.   Genitourinary: Negative.   Musculoskeletal: Negative.   Skin: Negative.   Neurological: Negative.   Endo/Heme/Allergies: Negative.   Psychiatric/Behavioral:  Positive for depression, hallucinations and substance abuse.   Blood pressure (!) 149/101, pulse 76, temperature 98.4 F (36.9 C), resp. rate 18, SpO2 98 %. There is no height or weight on file to calculate BMI.  Musculoskeletal: Strength & Muscle Tone: within normal limits Gait & Station: normal Patient leans: N/A   [facility] MSE Discharge Disposition for Follow up and Recommendations: Based on my evaluation the patient appears to have an emergency medical condition for which I recommend the patient be transferred to the emergency department for further evaluation.    Patient transferred to [facility] via EMS for medical clearance. Accepting provider at [facility] Dr. Adela Lank. Patient may return to the [facility]gent once medically cleared for continuous assessment. Patient is voluntary.      Layla Barter, NP 09/26/2021, 12:58 PM

## 2021-09-26 NOTE — Discharge Instructions (Signed)
Transfer to [facility] for medical clearance. °

## 2021-09-26 NOTE — ED Notes (Signed)
Pt asleep in bed. Respirations even and unlabored. Will continue to monitor for safety. ?

## 2021-09-26 NOTE — Progress Notes (Signed)
Pt is transferred to [facility] per NP order. Pt complained of of right chest pain. Pt is transported via EMS.

## 2021-09-26 NOTE — Progress Notes (Signed)
   09/26/21 1200  [facility] Triage Screening (Walk-ins at [facility] only)  How Did You Hear About [place]? Self  What Is the Reason for Your Visit/Call Today? Patient with hx of Schizoaffective Disorder, currently under care of [facility] for med management.  He lost his mother (stepmother) 2 months ago, which has significantly worsened his depressive symptoms.  He is only sleeping 1.5 hrs-2 hrs per night for past 2 months, low appetite and has lost 57 lbs in 2 mos.  He denies current SI, HI and is having baseline "chatter" auditory hallucinations.  He is also c/o right sided chest pain with numbing in rt arm 3-4 times per day. He has been off lisinopril - 2 wks since EDP is requesting appt/approvals prior to refilling.  He has not been able to get in to be seen.  He reports he had to go to ED 1 month ago with elevated BP.  Patient reports being compliant with abilify and risperidone.  He relapsed on cocaine back in March 2022 and since his step-mother died two mos ago, he has been using almost daily.  Patient is interested in possible [facility] admission to address depression.  How Long Has This Been Causing You Problems? 1-6 months  Have You Recently Had Any Thoughts About Hurting Yourself? No  Are You Planning to Commit Suicide/Harm Yourself At This time? No  Have you Recently Had Thoughts About Hurting Someone Karolee Ohs? No  Are You Planning To Harm Someone At This Time? No  Are you currently experiencing any auditory, visual or other hallucinations? Yes  Please explain the hallucinations you are currently experiencing: baseline AH of "chatter" no command hallucinations  Have You Used Any Alcohol or Drugs in the Past 24 Hours? Yes  How long ago did you use Drugs or Alcohol? yesterday  What Did You Use and How Much? cocaine - amt unknown  Do you have any current medical co-morbidities that require immediate attention? Yes  Please describe current medical co-morbidities that require immediate attention: CP and  elevated BP  Clinician description of patient physical appearance/behavior: Calm, cooperative and pleasant  What Do You Feel Would Help You the Most Today? Alcohol or Drug Use Treatment;Treatment for Depression or other mood problem  If access to [facility] was not available, would you have sought care in the Emergency Department? Yes  Determination of Need Urgent (48 hours)  Options For Referral Medication Management;BH Urgent Care;Facility-Based Crisis;Outpatient Therapy

## 2021-09-26 NOTE — Progress Notes (Signed)
Call placed again for the 3rd time. No answer. Pt en-route to [facility] at this time.

## 2021-09-26 NOTE — ED Provider Notes (Signed)
Emergency Medicine Provider Triage Evaluation Note  JERAN HILTZ , a 40 y.o. male  was evaluated in triage.  Pt complains of chest pain that started earlier this morning patient has a cocaine addiction with last usage last night.  Taking approximately 1-1/2 g of cocaine a day.  Describes his pain as pressure, with pain rating down the right arm.  Pain comes and goes every 45 minutes to an hour lasting for about 15 minutes each time.  No alleviating or aggravating factors.  Also endorses numbness of the right arm as well.  Patient denies shortness of breath, abdominal pain, nausea vomiting diarrhea..  He has a history of hypertension but has not taken his medication in 2 weeks. Review of Systems  Positive: Chest pain, right arm numbness and tingling Negative: Shortness of breath, abdominal pain, nausea, vomiting, diarrhea.  Physical Exam  BP (!) 142/103 (BP Location: Right Arm)   Pulse 74   Temp 98 F (36.7 C)   Resp 18   SpO2 96%  Gen:   Awake, no distress   Resp:  Normal effort  MSK:   Moves extremities without difficulty; decreased sensation of the right distal extremities.  4/5 right-sided grip strength.  5/5 left-sided grip strength Other:    Medical Decision Making  Medically screening exam initiated at 2:04 PM.  Appropriate orders placed.  TRAYON KRANTZ was informed that the remainder of the evaluation will be completed by another provider, this initial triage assessment does not replace that evaluation, and the importance of remaining in the ED until their evaluation is complete.     Janell Quiet, [place] 09/26/21 1406    Terald Sleeper, MD 09/26/21 [phone]

## 2021-09-26 NOTE — Progress Notes (Addendum)
Pt is admitted to Continuous OBS due to SI and depression. Pt currently denies. Pt verbally contracts for safety on the unit. Pt is alert and oriented. Pt is ambulatory and oriented to staff/unit. Pt complained of medial chest pain. Pt was seen at [facility] earlier for same issue and was medically cleared. No signs of acute distress noted. Pt denies current HI/AVH. Staff will monitor for pt's safety.

## 2021-09-26 NOTE — Progress Notes (Signed)
Called to give nurse to nurse report X2. Call was re-routed to [phone] and was not answered. Will try again later.

## 2021-09-26 NOTE — ED Provider Notes (Signed)
[facility] EMERGENCY DEPARTMENT Provider Note   CSN: [id number] Arrival date & time: 09/26/21  1334     History CC: Chest pain   [name] is a 40 y.o. male with history of bipolar disorder, hypertension, hyperlipidemia, smoking, presenting to the ED with chest pain.  The patient reports that he uses cocaine on a daily basis.  He last use last night.  He reports for the past 2 weeks or so he has been having episodes of tightness in the right side of his chest and also pain that seems to radiate from the right wrist up to his right shoulder.  These episodes occur at random, are not necessarily associated with exertion, typically last about 15 minutes and then ease up.  He is concerned that he has been having more frequent episodes the past 2 weeks.  He denies history of diabetes.  He is not aware of his family history.  He says he has never had any diagnosis of MI or heart attack himself.  He has never had a cardiac work-up.  He denies any diagnosis of lung disease but does report he smokes frequently and daily.  He states he was referred to the ED for chest pain evaluation from "the mental health clinic up the street".  HPI     Past Medical History:  Diagnosis Date   Bipolar 1 disorder (HCC)    Hypertension 08/08/2017    Patient Active Problem List   Diagnosis Date Noted   Tobacco use disorder 08/08/2017   Hypertension 08/08/2017   Insomnia 08/08/2017   Viral syndrome 10/22/2013    History reviewed. No pertinent surgical history.     Family History  Adopted: Yes  Family history unknown: Yes    Social History   Tobacco Use   Smoking status: Every Day    Packs/day: 0.50    Types: Cigarettes   Smokeless tobacco: Never   Tobacco comments:    down to 4 cigarettes a day   Vaping Use   Vaping Use: Never used  Substance Use Topics   Alcohol use: No   Drug use: Yes    Types: Marijuana    Comment: daily    Home Medications Prior to  Admission medications   Medication Sig Start Date End Date Taking? Authorizing Provider  albuterol (VENTOLIN HFA) 108 (90 Base) MCG/ACT inhaler Inhale 2 puffs into the lungs every 4 (four) hours as needed for wheezing or shortness of breath. 09/26/21  Yes Terald Sleeper, MD  atorvastatin (LIPITOR) 40 MG tablet Take 2 tablets (80 mg total) by mouth daily. 12/18/20  Yes Grayce Sessions, NP  hydrOXYzine (ATARAX/VISTARIL) 25 MG tablet Take 1 tablet (25 mg total) by mouth daily. 11/25/20  Yes Arfeen, Phillips Grout, MD  lisinopril (ZESTRIL) 20 MG tablet TAKE 1 TABLET BY MOUTH EVERY DAY Patient taking differently: Take 20 mg by mouth daily. 07/23/21  Yes Grayce Sessions, NP  omeprazole (PRILOSEC) 40 MG capsule TAKE 1 CAPSULE (40 MG TOTAL) BY MOUTH DAILY. 07/26/21  Yes Grayce Sessions, NP  propranolol (INDERAL) 40 MG tablet TAKE 1 TABLET BY MOUTH TWICE A DAY Patient taking differently: Take 40 mg by mouth 2 (two) times daily. 06/13/21  Yes Hoy Register, MD  risperiDONE (RISPERDAL) 1 MG tablet Take 1 tablet (1 mg total) by mouth at bedtime. 11/25/20  Yes Arfeen, Phillips Grout, MD  traZODone (DESYREL) 100 MG tablet Take one tab at bed time Patient taking differently: Take 100 mg by  mouth at bedtime as needed for sleep. 11/25/20  Yes Arfeen, Arlyce Harman, MD    Allergies    Patient has no known allergies.  Review of Systems   Review of Systems  Constitutional:  Negative for chills and fever.  HENT:  Negative for ear pain and sore throat.   Eyes:  Negative for pain and visual disturbance.  Respiratory:  Negative for cough and shortness of breath.   Cardiovascular:  Positive for chest pain. Negative for palpitations.  Gastrointestinal:  Negative for abdominal pain and vomiting.  Genitourinary:  Negative for dysuria and hematuria.  Musculoskeletal:  Positive for arthralgias. Negative for back pain.  Skin:  Negative for color change and rash.  Neurological:  Negative for syncope and light-headedness.  All  other systems reviewed and are negative.  Physical Exam Updated Vital Signs BP 140/81   Pulse 85   Temp 98.2 F (36.8 C) (Oral)   Resp 14   SpO2 99%   Physical Exam Constitutional:      General: He is not in acute distress. HENT:     Head: Normocephalic and atraumatic.  Eyes:     Conjunctiva/sclera: Conjunctivae normal.     Pupils: Pupils are equal, round, and reactive to light.  Cardiovascular:     Rate and Rhythm: Normal rate and regular rhythm.  Pulmonary:     Effort: Pulmonary effort is normal. No respiratory distress.     Comments: Expiratory wheezing bilaterally Abdominal:     General: There is no distension.     Tenderness: There is no abdominal tenderness.  Skin:    General: Skin is warm and dry.  Neurological:     General: No focal deficit present.     Mental Status: He is alert and oriented to person, place, and time. Mental status is at baseline.  Psychiatric:        Mood and Affect: Mood normal.        Behavior: Behavior normal.    ED Results / Procedures / Treatments   Labs (all labs ordered are listed, but only abnormal results are displayed) Labs Reviewed  CBC - Abnormal; Notable for the following components:      Result Value   MCH 24.6 (*)    RDW 15.7 (*)    All other components within normal limits  BASIC METABOLIC PANEL  TROPONIN I (HIGH SENSITIVITY)    EKG EKG Interpretation  Date/Time:  Saturday September 26 2021 15:58:16 EST Ventricular Rate:  81 PR Interval:  177 QRS Duration: 99 QT Interval:  376 QTC Calculation: 437 R Axis:   51 Text Interpretation: Sinus rhythm Confirmed by Octaviano Glow [id number]) on 09/26/2021 4:01:01 PM  Radiology DG Chest 2 View  Result Date: 09/26/2021 CLINICAL DATA:  Patient arrived by [facility] from [facility] with complaint of chest pain. Went to [facility] for detox from cocaine. Denies SI/HI. Alert and oriented, NAD EXAM: CHEST - 2 VIEW COMPARISON:  05/29/2017 FINDINGS: Cardiac silhouette is normal in size. Normal  mediastinal and hilar contours. Clear lungs.  No pleural effusion or pneumothorax. Skeletal structures are intact. IMPRESSION: No active cardiopulmonary disease. Electronically Signed   By: Lajean Manes M.D.   On: 09/26/2021 14:29    Procedures Procedures   Medications Ordered in ED Medications  albuterol (VENTOLIN HFA) 108 (90 Base) MCG/ACT inhaler 2 puff (2 puffs Inhalation Given 09/26/21 1631)    ED Course  I have reviewed the triage vital signs and the nursing notes.  Pertinent labs & imaging results that were  available during my care of the patient were reviewed by me and considered in my medical decision making (see chart for details).  This patient presents to the Emergency Department with complaint of chest pain. This involves an extensive number of treatment options, and is a complaint that carries with it a high risk of complications and morbidity.  The differential diagnosis includes cocaine vasospasm most likely versus ACS vs Pneumothorax vs Reflux/Gastritis vs MSK pain vs Pneumonia vs other.  I felt PE was less likely given that his symptoms have been intermittent for 2 weeks, he has no tachycardia or hypoxia.  He has no acute risk factors for PE.  He is PERC negative  I ordered, reviewed, and interpreted labs, including BMP and CBC.  There were no immediate, life-threatening emergencies found in this labwork.  The patient's troponin level was 3 with >2 weeks of symptoms - unlikely ACS. I ordered imaging studies which included dg chest I independently visualized and interpreted imaging which showed no focal infiltrate or evidence of pneumothorax, and the monitor tracing which showed normal sinus rhythm I personally reviewed the patients ECG which showed sinus rhythm with no acute ischemic findings  He does have some expiratory wheezing bilaterally which I suspect is likely due to chronic emphysema from smoking.  He may benefit from some albuterol treatment here, and I will  prescribe an inhaler for him to use at home.  After the interventions stated above, I reevaluated the patient and found that they remained clinically stable.  Based on the patient's clinical exam, vital signs, risk factors, and ED testing, I felt that the patient's overall risk of life-threatening emergency such as ACS, PE, sepsis, or infection was low.  At this time, I felt the patient's presentation was most clinically consistent with cocaine induced vasospasm, but explained to the patient that this evaluation was not a definitive diagnostic workup.  Given his risk factors for coronary disease, I do think it is reasonable to refer him to a cardiologist for outpatient follow-up.  A referral was placed.  I strongly stressed the importance of smoking cessation and discontinuing cocaine.  I made it clear that the cocaine may be exacerbating her worsening heart condition.  He verbalized understanding for this.  I discussed outpatient follow up with primary care provider, and provided specialist office number on the patient's discharge paper if a referral was deemed necessary.  Return precautions were discussed with the patient.  I felt the patient was clinically stable for discharge.  Final Clinical Impression(s) / ED Diagnoses Final diagnoses:  Chest pain, unspecified type  Wheezing    Rx / DC Orders ED Discharge Orders          Ordered    Ambulatory referral to Cardiology       Comments: Chest pain evaluation   09/26/21 1602    albuterol (VENTOLIN HFA) 108 (90 Base) MCG/ACT inhaler  Every 4 hours PRN        09/26/21 1602             Wyvonnia Dusky, MD 09/26/21 1947

## 2021-09-26 NOTE — ED Notes (Signed)
Pt calm and cooperative. No c/o pain or distress. A & O x 4. Will continue to monitor for safety °

## 2021-09-26 NOTE — ED Triage Notes (Addendum)
Patient arrived by [facility] from [facility] with complaint of chest pain. Went to [facility] for detox from cocaine. Denies SI/HI. Alert and oriented, NAD patient reports that he has had intermittent cp x 2 weeks since using drugs again with radiation to right arm

## 2021-09-26 NOTE — ED Notes (Signed)
Pt admitted to [facility] due to substance use and depression. Pt A&O x4, calm and cooperative. Pt denies SI/HI/AVH. Pt tolerated admission process and skin assessment well. Pt ambulated independently to unit. Oriented to unit/staff. Sandwich, chips, and juice given per pt request. Pt denies any further needs at this time. No signs of acute distress noted. Will continue to monitor for safety.

## 2021-09-26 NOTE — ED Provider Notes (Signed)
Behavioral Health Admission [facility] & OBS)  Date: 09/26/21 Patient Name: [name] MRN: [id number] Diagnoses:  Final diagnoses:  Substance induced mood disorder (HCC)  Cocaine abuse (HCC)   HPI: History of Present illness: [name] is a 40 y.o. male patient who presents back to the [facility] voluntarily as a walk-in after medical clearance from [facility]. Patients states that he walked here from [facility]. This provider contacted the nurse Donato Heinz., at [facility] via secure chat who states "pt told me that the doctor told him that he was cleared to return there. I was not told that but the pt is lucid , he was also given a bus pass before he left here."    Per Dr. Renaye Rakers, ED note: He does have some expiratory wheezing bilaterally which I suspect is likely due to chronic emphysema from smoking. He may benefit from some albuterol treatment here, and I will prescribe an inhaler for him to use at home.   After the interventions stated above, I reevaluated the patient and found that they remained clinically stable.   Based on the patient's clinical exam, vital signs, risk factors, and ED testing, I felt that the patient's overall risk of life-threatening emergency such as ACS, PE, sepsis, or infection was low.  At this time, I felt the patient's presentation was most clinically consistent with cocaine induced vasospasm, but explained to the patient that this evaluation was not a definitive diagnostic workup.  Given his risk factors for coronary disease, I do think it is reasonable to refer him to a cardiologist for outpatient follow-up.  A referral was placed.  I strongly stressed the importance of smoking cessation and discontinuing cocaine. I made it clear that the cocaine may be exacerbating her worsening heart condition.  He verbalized understanding for this.   I discussed outpatient follow up with primary care provider, and provided specialist office number on the  patient's discharge paper if a referral was deemed necessary.  Return precautions were discussed with the patient.  I felt the patient was clinically stable for discharge.    Patient seen and evaluated face-to-face by this provider, chart reviewed, and case discussed with Dr. Gasper Sells. On evaluation, patient is alert and oriented x4. His thought process is logical, and speech is clear and coherent. He is calm, cooperative and does not appear to be in distress.    Patient reports that he recently relapsed from cocaine two months ago after being clean for three years. He reports using cocaine everyday, on average "over a gram" per day for the past two months. He reports that he last used cocaine last night. He reports smoking marijuana daily, on average $20 worth since age 27. He denies drinking alcohol. He states that he has attended substance abuse treatment in the past at [facility] in 2009 and at [facility] in 2010.    He reports feeling increasingly depressed for the past two months after his mother passed away. He describes his depressive symptoms as decreased appetite, weight loss-56 lbs in last month, poor sleep (1.5 to 2 hours), sadness, crying spells and irritability. He denies suicidal and homicidal ideations. He denies VH. He endorses chronic AH and describes it as "chatter." He does not appear objectively to be responding to internal or external stimuli. Patient has a reported hx of schizoaffective dx.    He reports that he resides in a boarding house with roommates. He states that he is currently on disability. He states  that he follows up with [facility] for medication management. He states that he is prescribed lisinopril, propranolol, Abilify and Risperdal.      PHQ 2-9:  Flowsheet Row ED from 09/26/2021 in [facility]  Thoughts that you would be better off dead, or of hurting yourself in some way Not at all  PHQ-9 Total Score 18       Flowsheet Row  ED from 09/26/2021 in [facility] EMERGENCY DEPARTMENT Most recent reading at 09/26/2021  1:55 PM ED from 09/26/2021 in [facility] Most recent reading at 09/26/2021 12:55 PM  C-SSRS RISK CATEGORY No Risk No Risk        Total Time spent with patient: 30 minutes  Musculoskeletal  Strength & Muscle Tone: within normal limits Gait & Station: normal Patient leans: N/A  Psychiatric Specialty Exam  Presentation General Appearance: Appropriate for Environment  Eye Contact:Fair  Speech:Clear and Coherent  Speech Volume:Normal  Handedness:No data recorded  Mood and Affect  Mood:Dysphoric  Affect:Congruent   Thought Process  Thought Processes:Coherent  Descriptions of Associations:Intact  Orientation:Full (Time, Place and Person)  Thought Content:Logical  Diagnosis of Schizophrenia or Schizoaffective disorder in past: No  Duration of Psychotic Symptoms: Greater than six months  Hallucinations:Hallucinations: Auditory Description of Auditory Hallucinations: "chatter."  Ideas of Reference:None  Suicidal Thoughts:Suicidal Thoughts: No  Homicidal Thoughts:Homicidal Thoughts: No   Sensorium  Memory:Immediate Fair; Recent Fair; Remote Fair  Judgment:Fair  Insight:Fair   Executive Functions  Concentration:Fair  Attention Span:Fair  Recall:Fair  Fund of Knowledge:Fair  Language:Fair   Psychomotor Activity  Psychomotor Activity:Psychomotor Activity: Normal   Assets  Assets:Communication Skills; Desire for Improvement; Financial Resources/Insurance; Leisure Time; Physical Health; Social Support   Sleep  Sleep:Sleep: Poor Number of Hours of Sleep: 2   Nutritional Assessment (For OBS and FBC admissions only) Has the patient had a weight loss or gain of 10 pounds or more in the last 3 months?: Yes Has the patient had a decrease in food intake/or appetite?: Yes Does the patient have dental problems?:  No Does the patient have eating habits or behaviors that may be indicators of an eating disorder including binging or inducing vomiting?: No Has the patient recently lost weight without trying?: 4 Has the patient been eating poorly because of a decreased appetite?: 1 Malnutrition Screening Tool Score: 5 Nutritional Assessment Referrals: Refer to Social Work for [facility]   Physical Exam Constitutional:      Appearance: Normal appearance.  HENT:     Head: Normocephalic.     Nose: Nose normal.  Eyes:     Conjunctiva/sclera: Conjunctivae normal.  Cardiovascular:     Rate and Rhythm: Normal rate.  Pulmonary:     Effort: Pulmonary effort is normal.  Musculoskeletal:        General: Normal range of motion.     Cervical back: Normal range of motion.  Neurological:     Mental Status: He is alert and oriented to person, place, and time.   Review of Systems  Constitutional: Negative.   HENT: Negative.    Eyes: Negative.   Respiratory: Negative.    Cardiovascular: Negative.   Gastrointestinal: Negative.   Genitourinary: Negative.   Musculoskeletal: Negative.   Skin: Negative.   Neurological: Negative.   Endo/Heme/Allergies: Negative.   Psychiatric/Behavioral:  Positive for depression and substance abuse.    Blood pressure 132/82, pulse 81, temperature 98 F (36.7 C), temperature source Oral, resp. rate 19, SpO2 97 %.  There is no height or weight on file to calculate BMI.  Past Psychiatric History: Bipolar 1, schizoaffective disorder   Is the patient at risk to self? No  Has the patient been a risk to self in the past 6 months? No .    Has the patient been a risk to self within the distant past? No   Is the patient a risk to others? No   Has the patient been a risk to others in the past 6 months? No   Has the patient been a risk to others within the distant past? No   Past Medical History:  Past Medical History:  Diagnosis Date   Bipolar 1 disorder (HCC)     Hypertension 08/08/2017   No past surgical history on file.  Family History:  Family History  Adopted: Yes  Family history unknown: Yes    Social History:  Social History   Socioeconomic History   Marital status: Single    Spouse name: Not on file   Number of children: Not on file   Years of education: Not on file   Highest education level: Not on file  Occupational History   Not on file  Tobacco Use   Smoking status: Every Day    Packs/day: 0.50    Types: Cigarettes   Smokeless tobacco: Never   Tobacco comments:    down to 4 cigarettes a day   Vaping Use   Vaping Use: Never used  Substance and Sexual Activity   Alcohol use: No   Drug use: Yes    Types: Marijuana    Comment: daily   Sexual activity: Yes    Birth control/protection: Condom  Other Topics Concern   Not on file  Social History Narrative   Not on file   Social Determinants of Health   Financial Resource Strain: Not on file  Food Insecurity: Not on file  Transportation Needs: Not on file  Physical Activity: Not on file  Stress: Not on file  Social Connections: Not on file  Intimate Partner Violence: Not on file    SDOH:  SDOH Screenings   Alcohol Screen: Not on file  Depression (PHQ2-9): Medium Risk   PHQ-2 Score: 18  Financial Resource Strain: Not on file  Food Insecurity: Not on file  Housing: Not on file  Physical Activity: Not on file  Social Connections: Not on file  Stress: Not on file  Tobacco Use: High Risk   Smoking Tobacco Use: Every Day   Smokeless Tobacco Use: Never   Passive Exposure: Not on file  Transportation Needs: Not on file    Last Labs:  Admission on 09/26/2021, Discharged on 09/26/2021  Component Date Value Ref Range Status   Sodium 09/26/2021 136  135 - 145 mmol/L Final   Potassium 09/26/2021 4.2  3.5 - 5.1 mmol/L Final   Chloride 09/26/2021 103  98 - 111 mmol/L Final   CO2 09/26/2021 26  22 - 32 mmol/L Final   Glucose, Bld 09/26/2021 93  70 - 99 mg/dL  Final   Glucose reference range applies only to samples taken after fasting for at least 8 hours.   BUN 09/26/2021 14  6 - 20 mg/dL Final   Creatinine, Ser 09/26/2021 1.04  0.61 - 1.24 mg/dL Final   Calcium 51/76/1607 9.2  8.9 - 10.3 mg/dL Final   GFR, Estimated 09/26/2021 >60  >60 mL/min Final   Comment: (NOTE) Calculated using the CKD-EPI Creatinine Equation (2021)    Anion gap 09/26/2021  7  5 - 15 Final   Performed at [facility], [address], [place], [place] [address]   WBC 09/26/2021 9.4  4.0 - 10.5 K/uL Final   RBC 09/26/2021 5.56  4.22 - 5.81 MIL/uL Final   Hemoglobin 09/26/2021 13.7  13.0 - 17.0 g/dL Final   HCT 08/65/7846 45.1  39.0 - 52.0 % Final   MCV 09/26/2021 81.1  80.0 - 100.0 fL Final   MCH 09/26/2021 24.6 (L)  26.0 - 34.0 pg Final   MCHC 09/26/2021 30.4  30.0 - 36.0 g/dL Final   RDW 96/29/5284 15.7 (H)  11.5 - 15.5 % Final   Platelets 09/26/2021 207  150 - 400 K/uL Final   nRBC 09/26/2021 0.0  0.0 - 0.2 % Final   Performed at [facility], [address] [place], [place] [address]   Troponin I (High Sensitivity) 09/26/2021 3  <18 ng/L Final   Comment: (NOTE) Elevated high sensitivity troponin I (hsTnI) values and significant  changes across serial measurements may suggest ACS but many other  chronic and acute conditions are known to elevate hsTnI results.  Refer to the "Links" section for chest pain algorithms and additional  guidance. Performed at [facility], [address], [place], [place] [address]     Allergies: Patient has no known allergies.  PTA Medications: (Not in a hospital admission)  Medical Decision Making  Patient admitted to the [facility] continuous assessment unit pending bed placement tonight on the [facility] unit.   Medications ordered:  Meds ordered this encounter  Medications   acetaminophen (TYLENOL) tablet 650 mg   alum & mag hydroxide-simeth (MAALOX/MYLANTA) 200-200-20 MG/5ML suspension 30 mL   magnesium  hydroxide (MILK OF MAGNESIA) suspension 30 mL   hydrOXYzine (ATARAX/VISTARIL) tablet 25 mg   traZODone (DESYREL) tablet 50 mg   albuterol (VENTOLIN HFA) 108 (90 Base) MCG/ACT inhaler 2 puff   lisinopril (ZESTRIL) tablet 10 mg   DISCONTD: propranolol (INDERAL) tablet 20 mg   risperiDONE (RISPERDAL) tablet 1 mg   atorvastatin (LIPITOR) tablet 80 mg   propranolol (INDERAL) tablet 10 mg    Labs ordered:  Lab Orders         Resp Panel by RT-PCR (Flu A&B, Covid) Nasopharyngeal Swab         Hemoglobin A1c         Ethanol         Hepatic function panel         TSH         Lipid panel         POCT Urine Drug Screen - (ICup)         POC SARS Coronavirus 2 Ag-ED - Nasal Swab      Recommendations  Based on my evaluation the patient does not appear to have an emergency medical condition.  Layla Barter, NP 09/26/21  5:35 PM

## 2021-09-26 NOTE — Discharge Instructions (Addendum)
Your work-up today did not show signs of a heart attack.  However explained the importance of needing follow-up with a cardiologist based on her risk factors for heart disease.  I strongly, strongly encourage you to stop using cocaine.  This may be causing your chest pain and affecting your heart.  You also should make every effort to stop smoking, cutting back and stopping with cigarettes.  You have some wheezing in your lungs likely related to emphysema.  This is probably from your smoking.  I prescribed an albuterol inhaler to use as needed for wheezing and chest tightness.  You can take 2 puffs every 4-6 hours as needed.  *  I put a referral in our system to have the cardiologist reach out to you to set up an appointment.  They will normally call within 2 business days, if you do not hear from them in the end of 2 business days, please call the number above for heart care.

## 2021-09-26 NOTE — BH Assessment (Signed)
Comprehensive Clinical Assessment (CCA) Note  09/26/2021 AMADI FRADY [id number]  Disposition: Per Liborio Nixon, NP admission to Facility Based Crisis(FBC) at [facility] is recommended once patient is medically cleared.   He is transferring to [facility] for medical clearance, due to report of 7/10 Rt side CP and numbness in Rt arm.   The patient demonstrates the following risk factors for suicide: Chronic risk factors for suicide include: psychiatric disorder of Schizoaffective Disorder,, substance use disorder, and demographic factors (male, >35 y/o). Acute risk factors for suicide include: loss (financial, interpersonal, professional) and grief/loss of step-mother 2 mos ago . Protective factors for this patient include: positive social support, responsibility to others (children, family), and hope for the future. Considering these factors, the overall suicide risk at this point appears to be moderate. Patient is appropriate for outpatient follow up once stabilized.   Patient is a 40 year old male with a history of Schizoaffective Disorder and Cocaine Use Disorder, moderate who presents voluntarily to [facility] for assessment.  Patient is currently under care of [facility] for med management.  He lost his mother (stepmother) 2 months ago, which has significantly worsened his depressive symptoms.  He is only sleeping 1.5 hrs-2 hrs per night for past 2 months, low appetite and has lost 57 lbs in 2 mos.  He denies current SI, HI and is having baseline "chatter" auditory hallucinations.  He is also c/o right sided chest pain with numbing in rt arm 3-4 times per day. He has been off lisinopril - 2 wks since EDP is requesting appt/approvals prior to refilling.  He has not been able to get in to be seen.  He reports he had to go to ED 1 month ago with elevated BP.  Patient reports being compliant with abilify and risperidone.  He relapsed on cocaine back in March 2022 and since his  step-mother died two mos ago, he has been using almost daily.  Patient is interested in possible [facility] admission to address depression.   Chief Complaint: No chief complaint on file.  Visit Diagnosis: Schizoaffective Disorder, Cocaine Use Disorder, moderate   Flowsheet Row ED from 09/26/2021 in [facility]  Thoughts that you would be better off dead, or of hurting yourself in some way Not at all  PHQ-9 Total Score 18      Flowsheet Row ED from 09/26/2021 in [facility]  C-SSRS RISK CATEGORY No Risk        CCA Screening, Triage and Referral (STR)  Patient Reported Information How did you hear about [place]? Self  What Is the Reason for Your Visit/Call Today? Patient with hx of Schizoaffective Disorder, currently under care of [facility] for med management.  He lost his mother (stepmother) 2 months ago, which has significantly worsened his depressive symptoms.  He is only sleeping 1.5 hrs-2 hrs per night for past 2 months, low appetite and has lost 57 lbs in 2 mos.  He denies current SI, HI and is having baseline "chatter" auditory hallucinations.  He is also c/o right sided chest pain with numbing in rt arm 3-4 times per day. He has been off lisinopril - 2 wks since EDP is requesting appt/approvals prior to refilling.  He has not been able to get in to be seen.  He reports he had to go to ED 1 month ago with elevated BP.  Patient reports being compliant with abilify and risperidone.  He relapsed on cocaine  back in March 2022 and since his step-mother died two mos ago, he has been using almost daily.  Patient is interested in possible [facility] admission to address depression.  How Long Has This Been Causing You Problems? 1-6 months  What Do You Feel Would Help You the Most Today? Alcohol or Drug Use Treatment; Treatment for Depression or other mood problem   Have You Recently Had Any Thoughts About Hurting Yourself? No  Are  You Planning to Commit Suicide/Harm Yourself At This time? No   Have you Recently Had Thoughts About Hurting Someone Karolee Ohs? No  Are You Planning to Harm Someone at This Time? No  Explanation: No data recorded  Have You Used Any Alcohol or Drugs in the Past 24 Hours? Yes  How Long Ago Did You Use Drugs or Alcohol? No data recorded What Did You Use and How Much? cocaine - amt unknown   Do You Currently Have a Therapist/Psychiatrist? No  Name of Therapist/Psychiatrist: No data recorded  Have You Been Recently Discharged From Any Office Practice or Programs? No  Explanation of Discharge From Practice/Program: No data recorded    CCA Screening Triage Referral Assessment Type of Contact: Face-to-Face  Telemedicine Service Delivery:   Is this Initial or Reassessment? No data recorded Date Telepsych consult ordered in [facility]:  No data recorded Time Telepsych consult ordered in [facility]:  No data recorded Location of Assessment: [facility] Services  Provider Location: [facility] Assessment Services   Collateral Involvement: N/A   Does Patient Have a Automotive engineer Guardian? No data recorded Name and Contact of Legal Guardian: No data recorded If Minor and Not Living with Parent(s), Who has Custody? No data recorded Is CPS involved or ever been involved? Never  Is APS involved or ever been involved? Never   Patient Determined To Be At Risk for Harm To Self or Others Based on Review of Patient Reported Information or Presenting Complaint? No  Method: No data recorded Availability of Means: No data recorded Intent: No data recorded Notification Required: No data recorded Additional Information for Danger to Others Potential: No data recorded Additional Comments for Danger to Others Potential: No data recorded Are There Guns or Other Weapons in Your Home? No data recorded Types of Guns/Weapons: No data recorded Are These Weapons Safely Secured?                             No data recorded Who Could Verify You Are Able To Have These Secured: No data recorded Do You Have any Outstanding Charges, Pending Court Dates, Parole/Probation? No data recorded Contacted To Inform of Risk of Harm To Self or Others: No data recorded   Does Patient Present under Involuntary Commitment? No  IVC Papers Initial File Date: No data recorded  [place] of Residence: [place]   Patient Currently Receiving the Following Services: Medication Management   Determination of Need: Urgent (48 hours)   Options For Referral: Medication Management; BH Urgent Care; Facility-Based Crisis; Outpatient Therapy     CCA Biopsychosocial Patient Reported Schizophrenia/Schizoaffective Diagnosis in Past: Yes   Strengths: Patient is motivated towards treatment, has support   Mental Health Symptoms Depression:   Difficulty Concentrating; Hopelessness; Weight gain/loss; Sleep (too much or little); Increase/decrease in appetite; Worthlessness   Duration of Depressive symptoms:  Duration of Depressive Symptoms: Greater than two weeks   Mania:   None   Anxiety:    Tension; Sleep; Worrying  Psychosis:   Hallucinations   Duration of Psychotic symptoms:  Duration of Psychotic Symptoms: Greater than six months   Trauma:   None   Obsessions:   None   Compulsions:   None   Inattention:   N/A   Hyperactivity/Impulsivity:   N/A   Oppositional/Defiant Behaviors:   N/A   Emotional Irregularity:   Chronic feelings of emptiness   Other Mood/Personality Symptoms:  No data recorded   Mental Status Exam Appearance and self-care  Stature:   Average   Weight:   Overweight   Clothing:   Casual   Grooming:   Normal   Cosmetic use:   None   Posture/gait:   Normal   Motor activity:   Not Remarkable   Sensorium  Attention:   Normal   Concentration:   Normal   Orientation:   X5   Recall/memory:   Normal   Affect and Mood  Affect:   Depressed;  Flat; Tearful   Mood:   Depressed   Relating  Eye contact:   Normal   Facial expression:   Depressed; Responsive   Attitude toward examiner:   Cooperative   Thought and Language  Speech flow:  Clear and Coherent   Thought content:   Appropriate to Mood and Circumstances   Preoccupation:   None   Hallucinations:   Auditory   Organization:  No data recorded  [employer] of Knowledge:   Average   Intelligence:   Average   Abstraction:   Normal   Judgement:   Impaired   Reality Testing:   Adequate   Insight:   Fair   Decision Making:   Normal; Impulsive; Vacilates   Social Functioning  Social Maturity:   Responsible   Social Judgement:   Normal   Stress  Stressors:   Grief/losses   Coping Ability:   Exhausted; Overwhelmed   Skill Deficits:   Decision making; Self-care   Supports:   Friends/Service system     Religion: Religion/Spirituality Are You A Religious Person?: No  Leisure/Recreation: Leisure / Recreation Do You Have Hobbies?: No  Exercise/Diet: Exercise/Diet Do You Exercise?: No Have You Gained or Lost A Significant Amount of Weight in the Past Six Months?: Yes-Lost Number of Pounds Lost?: 57 (in 2 mos) Do You Follow a Special Diet?: No Do You Have Any Trouble Sleeping?: Yes Explanation of Sleeping Difficulties: 1.5-2 hrs per night for 2 mos   CCA Employment/Education Employment/Work Situation: Employment / Work Situation Employment Situation: On disability Why is Patient on Disability: Mental Illness How Long has Patient Been on Disability: "years" Has Patient ever Been in the [employer]?: No  Education: Education Is Patient Currently Attending School?: No Last Grade Completed: 12 Did You Attend College?: No Did You Have An Individualized Education Program (IIEP): No Did You Have Any Difficulty At School?: No Patient's Education Has Been Impacted by Current Illness: No   CCA Family/Childhood  History Family and Relationship History: Family history Marital status: Single Does patient have children?: No  Childhood History:  Childhood History By whom was/is the patient raised?: Mother, Father Did patient suffer any verbal/emotional/physical/sexual abuse as a child?: No Did patient suffer from severe childhood neglect?: No Has patient ever been sexually abused/assaulted/raped as an adolescent or adult?:  (NA) Was the patient ever a victim of a crime or a disaster?: No Witnessed domestic violence?: No Has patient been affected by domestic violence as an adult?: No  Child/Adolescent Assessment:     CCA Substance Use  Alcohol/Drug Use: Alcohol / Drug Use Pain Medications: See MAR Prescriptions: See MAR Over the Counter: See MAR History of alcohol / drug use?: Yes Longest period of sobriety (when/how long): 4.5 yrs up until 12/2020 Negative Consequences of Use: Financial, Personal relationships Withdrawal Symptoms: None Substance #1 Name of Substance 1: Cocaine 1 - Age of First Use: NA 1 - Amount (size/oz): varies 1 - Frequency: daily 1 - Duration: 2 months - sev times per week prior to this since 12/2020 1 - Last Use / Amount: yesterday, amt unknown 1 - Method of Aquiring: NA 1- Route of Use: NA Substance #2 Name of Substance 2: THC 2 - Age of First Use: 77s 2 - Amount (size/oz): varies 2 - Frequency: daily 2 - Duration: "years" 2 - Last Use / Amount: yesterday - amt unknown 2 - Method of Aquiring: NA 2 - Route of Substance Use: smokes                     ASAM's:  Six Dimensions of Multidimensional Assessment  Dimension 1:  Acute Intoxication and/or Withdrawal Potential:   Dimension 1:  Description of individual's past and current experiences of substance use and withdrawal: No significant hx of withdrawals  Dimension 2:  Biomedical Conditions and Complications:   Dimension 2:  Description of patient's biomedical conditions and  complications:  Adequate ability to cope with physical discomfort.  Dimension 3:  Emotional, Behavioral, or Cognitive Conditions and Complications:  Dimension 3:  Description of emotional, behavioral, or cognitive conditions and complications: Schizoaffective Disorder, with current worsening depression.  Dimension 4:  Readiness to Change:  Dimension 4:  Description of Readiness to Change criteria: Seeking treatment today  Dimension 5:  Relapse, Continued use, or Continued Problem Potential:  Dimension 5:  Relapse, continued use, or continued problem potential critiera description: Relapse potential increases with stress/current grief.  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  ASAM Severity Score: ASAM's Severity Rating Score: 6  ASAM Recommended Level of Treatment: ASAM Recommended Level of Treatment: Level II Intensive Outpatient Treatment   Substance use Disorder (SUD) Substance Use Disorder (SUD)  Checklist Symptoms of Substance Use: Continued use despite having a persistent/recurrent physical/psychological problem caused/exacerbated by use, Evidence of tolerance, Persistent desire or unsuccessful efforts to cut down or control use  Recommendations for Services/Supports/Treatments: Recommendations for Services/Supports/Treatments Recommendations For Services/Supports/Treatments: CD-IOP Intensive Chemical Dependency Program, Facility Based Crisis  Discharge Disposition:    DSM5 Diagnoses: Patient Active Problem List   Diagnosis Date Noted   Tobacco use disorder 08/08/2017   Hypertension 08/08/2017   Insomnia 08/08/2017   Viral syndrome 10/22/2013     Referrals to Alternative Service(s): Referred to Alternative Service(s):   Place:   Date:   Time:    Referred to Alternative Service(s):   Place:   Date:   Time:    Referred to Alternative Service(s):   Place:    Date:   Time:    Referred to Alternative Service(s):   Place:   Date:   Time:     Yetta Glassman, [facility]

## 2021-09-27 DIAGNOSIS — F1994 Other psychoactive substance use, unspecified with psychoactive substance-induced mood disorder: Secondary | ICD-10-CM | POA: Diagnosis present

## 2021-09-27 MED ORDER — GABAPENTIN 100 MG PO CAPS
100.0000 mg | ORAL_CAPSULE | Freq: Three times a day (TID) | ORAL | Status: DC
  Administered 2021-09-27 – 2021-09-30 (×9): 100 mg via ORAL
  Filled 2021-09-27 (×7): qty 1
  Filled 2021-09-27: qty 42
  Filled 2021-09-27 (×2): qty 1

## 2021-09-27 MED ORDER — NICOTINE 21 MG/24HR TD PT24
21.0000 mg | MEDICATED_PATCH | Freq: Every day | TRANSDERMAL | Status: DC
  Administered 2021-09-27 – 2021-09-30 (×4): 21 mg via TRANSDERMAL
  Filled 2021-09-27: qty 1
  Filled 2021-09-27: qty 14
  Filled 2021-09-27 (×3): qty 1

## 2021-09-27 NOTE — ED Notes (Signed)
Pt resting in bed. A&O x4, calm and cooperative. Pt denies SI/HI/AVH. Pt denies any current needs. No signs of acute distress noted. Will continue to monitor for safety.

## 2021-09-27 NOTE — ED Notes (Signed)
Pt asleep in bed. Respirations even and unlabored. Will continue to monitor for safety. ?

## 2021-09-27 NOTE — ED Notes (Addendum)
Pt requesting Nicotine patch. Karel Jarvis, PA made aware.

## 2021-09-27 NOTE — Progress Notes (Signed)
Calm, pleasant and cooperative.  Watching tv all day with peer.  No complaints.  Talking about wanting to leave.  Will monitor.

## 2021-09-27 NOTE — ED Notes (Signed)
Patient presently asleep in bed without distress.  Informed NP of patients overnight request received in shift change of a nicotine patch.  No withdrawal or somatic complaints.  Will monitor and provide safe environment.

## 2021-09-27 NOTE — Progress Notes (Signed)
Patient is awake at present sitting in day room with peer watching football. Patient denies current avh shi or plan.  He is calm, pleasant and conversant.  Gait is steady and CIWA is 0.  Patient has eaten and asked for and was given supplies for a shower.  Encouraged to make needs known.  Will monitor and provide safe supportive environment.

## 2021-09-27 NOTE — ED Provider Notes (Addendum)
Behavioral Health Progress Note  Date and Time: 09/27/2021 10:44 AM Name: [name] MRN:  [id number]  Subjective: History of Present illness: [name] is a 40 y.o. male patient presents to the [facility] voluntarily as a walk-in unaccompanied with a chief complaint of "cocaine use and depression."  Patient seen and re-evaluated face to face by this provider, chart reviewed and case discussed with Dr. Gasper Sells.   On evaluation, patient is alert and oriented x 4. His thought process is logical and speech is clear and coherent. His mood is dysphoric and affect is congruent.    Patient reports that he doesn't feel as bad as he did yesterday. He reports decreased depression today. He denies feeling anxious this morning. He reports fair sleep last night. He reports having a fair appetite. He denies AVH today and is no longer hearing "chatter" noises. He does not appear to be responding to internal or external stimuli. He reports cocaine cravings. I discussed with the patient taking Neurontin 100 mg po TID off label for cocaine withdrawal and cravings. I discussed the risk and benefits of taking Neurontin. Patient agrees to the stated plan. Patient has been compliant with taking scheduled medications without any side effects. Patient informed that this provider has been unable to verify that he was prescribed Abilify. Pharmacy states that there is no record of the patient being prescribed Abilify. This provider contacted [facility] this am, no answer. Patient states "okay" when informed that the we have not been able to verify medication. I discussed discharge planning with the patient. He states that he is undecided about a plan. Patient encouraged to speak with CSW on Monday regarding discharge options.   Diagnosis:  Final diagnoses:  Substance induced mood disorder (HCC)  Cocaine abuse (HCC)    Total Time spent with patient: 20 minutes  Past Psychiatric History:  Cocaine use, MDD  Past Medical History:  Past Medical History:  Diagnosis Date   Bipolar 1 disorder (HCC)    Hypertension 08/08/2017   History reviewed. No pertinent surgical history. Family History:  Family History  Adopted: Yes  Family history unknown: Yes   Family Psychiatric  History: No hx reported Social History:  Social History   Substance and Sexual Activity  Alcohol Use No     Social History   Substance and Sexual Activity  Drug Use Yes   Types: Marijuana   Comment: daily    Social History   Socioeconomic History   Marital status: Single    Spouse name: Not on file   Number of children: Not on file   Years of education: Not on file   Highest education level: Not on file  Occupational History   Not on file  Tobacco Use   Smoking status: Every Day    Packs/day: 0.50    Types: Cigarettes   Smokeless tobacco: Never   Tobacco comments:    down to 4 cigarettes a day   Vaping Use   Vaping Use: Never used  Substance and Sexual Activity   Alcohol use: No   Drug use: Yes    Types: Marijuana    Comment: daily   Sexual activity: Yes    Birth control/protection: Condom  Other Topics Concern   Not on file  Social History Narrative   Not on file   Social Determinants of Health   Financial Resource Strain: Not on file  Food Insecurity: Not on file  Transportation Needs: Not on file  Physical Activity: Not on file  Stress: Not on file  Social Connections: Not on file   SDOH:  SDOH Screenings   Alcohol Screen: Not on file  Depression (PHQ2-9): Medium Risk   PHQ-2 Score: 18  Financial Resource Strain: Not on file  Food Insecurity: Not on file  Housing: Not on file  Physical Activity: Not on file  Social Connections: Not on file  Stress: Not on file  Tobacco Use: High Risk   Smoking Tobacco Use: Every Day   Smokeless Tobacco Use: Never   Passive Exposure: Not on file  Transportation Needs: Not on file   Additional Social History:    Current  Medications:  Current Facility-Administered Medications  Medication Dose Route Frequency Provider Last Rate Last Admin   acetaminophen (TYLENOL) tablet 650 mg  650 mg Oral Q6H PRN Quinto Tippy L, NP       albuterol (VENTOLIN HFA) 108 (90 Base) MCG/ACT inhaler 2 puff  2 puff Inhalation Q4H PRN Lendora Keys L, NP       alum & mag hydroxide-simeth (MAALOX/MYLANTA) 200-200-20 MG/5ML suspension 30 mL  30 mL Oral Q4H PRN Belenda Alviar L, NP       atorvastatin (LIPITOR) tablet 80 mg  80 mg Oral Daily Blandina Renaldo L, NP   80 mg at 09/26/21 1828   hydrOXYzine (ATARAX/VISTARIL) tablet 25 mg  25 mg Oral TID PRN Destany Severns L, NP       lisinopril (ZESTRIL) tablet 10 mg  10 mg Oral Daily Corbin Hott L, NP   10 mg at 09/27/21 1043   magnesium hydroxide (MILK OF MAGNESIA) suspension 30 mL  30 mL Oral Daily PRN Zell Doucette L, NP       nicotine (NICODERM CQ - dosed in mg/24 hours) patch 21 mg  21 mg Transdermal Daily Susann Lawhorne L, NP   21 mg at 09/27/21 1042   propranolol (INDERAL) tablet 10 mg  10 mg Oral BID Arley Garant L, NP   10 mg at 09/27/21 1042   risperiDONE (RISPERDAL) tablet 1 mg  1 mg Oral QHS Davonda Ausley L, NP   1 mg at 09/26/21 2116   traZODone (DESYREL) tablet 50 mg  50 mg Oral QHS PRN Laban Orourke L, NP       Current Outpatient Medications  Medication Sig Dispense Refill   albuterol (VENTOLIN HFA) 108 (90 Base) MCG/ACT inhaler Inhale 2 puffs into the lungs every 4 (four) hours as needed for wheezing or shortness of breath. 8 g 0   atorvastatin (LIPITOR) 40 MG tablet Take 2 tablets (80 mg total) by mouth daily. 90 tablet 1   hydrOXYzine (ATARAX/VISTARIL) 25 MG tablet Take 1 tablet (25 mg total) by mouth daily. 30 tablet 1   lisinopril (ZESTRIL) 20 MG tablet TAKE 1 TABLET BY MOUTH EVERY DAY (Patient taking differently: Take 20 mg by mouth daily.) 30 tablet 0   omeprazole (PRILOSEC) 40 MG capsule TAKE 1 CAPSULE (40 MG TOTAL) BY MOUTH DAILY. 90 capsule 1   propranolol  (INDERAL) 40 MG tablet TAKE 1 TABLET BY MOUTH TWICE A DAY (Patient taking differently: Take 40 mg by mouth 2 (two) times daily.) 60 tablet 0   risperiDONE (RISPERDAL) 1 MG tablet Take 1 tablet (1 mg total) by mouth at bedtime. 90 tablet 0   traZODone (DESYREL) 100 MG tablet Take one tab at bed time (Patient taking differently: Take 100 mg by mouth at bedtime as needed for sleep.) 90 tablet 0    Labs  Lab Results:  Admission on 09/26/2021  Component Date Value Ref Range Status   SARS Coronavirus 2 by RT PCR 09/26/2021 NEGATIVE  NEGATIVE Final   Comment: (NOTE) SARS-CoV-2 target nucleic acids are NOT DETECTED.  The SARS-CoV-2 RNA is generally detectable in upper respiratory specimens during the acute phase of infection. The lowest concentration of SARS-CoV-2 viral copies this assay can detect is 138 copies/mL. A negative result does not preclude SARS-Cov-2 infection and should not be used as the sole basis for treatment or other patient management decisions. A negative result may occur with  improper specimen collection/handling, submission of specimen other than nasopharyngeal swab, presence of viral mutation(s) within the areas targeted by this assay, and inadequate number of viral copies(<138 copies/mL). A negative result must be combined with clinical observations, patient history, and epidemiological information. The expected result is Negative.  Fact Sheet for Patients:  BloggerCourse.com  Fact Sheet for Healthcare Providers:  SeriousBroker.it  This test is no                          t yet approved or cleared by the [place] FDA and  has been authorized for detection and/or diagnosis of SARS-CoV-2 by FDA under an Emergency Use Authorization (EUA). This EUA will remain  in effect (meaning this test can be used) for the duration of the COVID-19 declaration under Section 564(b)(1) of the Act, 21 U.S.C.section  360bbb-3(b)(1), unless the authorization is terminated  or revoked sooner.       Influenza A by PCR 09/26/2021 NEGATIVE  NEGATIVE Final   Influenza B by PCR 09/26/2021 NEGATIVE  NEGATIVE Final   Comment: (NOTE) The Xpert Xpress SARS-CoV-2/FLU/RSV plus assay is intended as an aid in the diagnosis of influenza from Nasopharyngeal swab specimens and should not be used as a sole basis for treatment. Nasal washings and aspirates are unacceptable for Xpert Xpress SARS-CoV-2/FLU/RSV testing.  Fact Sheet for Patients: BloggerCourse.com  Fact Sheet for Healthcare Providers: SeriousBroker.it  This test is not yet approved or cleared by the [place] FDA and has been authorized for detection and/or diagnosis of SARS-CoV-2 by FDA under an Emergency Use Authorization (EUA). This EUA will remain in effect (meaning this test can be used) for the duration of the COVID-19 declaration under Section 564(b)(1) of the Act, 21 U.S.C. section 360bbb-3(b)(1), unless the authorization is terminated or revoked.  Performed at [facility], [address] [place], [place] [address]    Alcohol, Ethyl (B) 09/26/2021 <10  <10 mg/dL Final   Comment: (NOTE) Lowest detectable limit for serum alcohol is 10 mg/dL.  For medical purposes only. Performed at [facility], [address], [place], [place] [address]    Total Protein 09/26/2021 6.6  6.5 - 8.1 g/dL Final   Albumin 09/81/1914 3.8  3.5 - 5.0 g/dL Final   AST 78/29/5621 27  15 - 41 U/L Final   ALT 09/26/2021 35  0 - 44 U/L Final   Alkaline Phosphatase 09/26/2021 70  38 - 126 U/L Final   Total Bilirubin 09/26/2021 0.4  0.3 - 1.2 mg/dL Final   Bilirubin, Direct 09/26/2021 <0.1  0.0 - 0.2 mg/dL Final   Indirect Bilirubin 09/26/2021 NOT CALCULATED  0.3 - 0.9 mg/dL Final   Performed at [facility], [address] [place], [place] [address]   TSH 09/26/2021 1.151  0.350 - 4.500  uIU/mL Final   Comment: Performed by a 3rd Generation assay  with a functional sensitivity of <=0.01 uIU/mL. Performed at [facility], [address], [place], [place] [address]    Cholesterol 09/26/2021 187  0 - 200 mg/dL Final   Triglycerides 22/12/5425 96  <150 mg/dL Final   HDL 04/24/7627 38 (L)  >40 mg/dL Final   Total CHOL/HDL Ratio 09/26/2021 4.9  RATIO Final   VLDL 09/26/2021 19  0 - 40 mg/dL Final   LDL Cholesterol 09/26/2021 130 (H)  0 - 99 mg/dL Final   Comment:        Total Cholesterol/HDL:CHD Risk Coronary Heart Disease Risk Table                     Men   Women  1/2 Average Risk   3.4   3.3  Average Risk       5.0   4.4  2 X Average Risk   9.6   7.1  3 X Average Risk  23.4   11.0        Use the calculated Patient Ratio above and the CHD Risk Table to determine the patient's CHD Risk.        ATP III CLASSIFICATION (LDL):  <100     mg/dL   Optimal  315-176  mg/dL   Near or Above                    Optimal  130-159  mg/dL   Borderline  160-737  mg/dL   High  >106     mg/dL   Very High Performed at [facility], [address], [place], [place] [address]    POC Amphetamine UR 09/26/2021 None Detected  NONE DETECTED (Cut Off Level 1000 ng/mL) Final   POC Secobarbital (BAR) 09/26/2021 None Detected  NONE DETECTED (Cut Off Level 300 ng/mL) Final   POC Buprenorphine (BUP) 09/26/2021 None Detected  NONE DETECTED (Cut Off Level 10 ng/mL) Final   POC Oxazepam (BZO) 09/26/2021 None Detected  NONE DETECTED (Cut Off Level 300 ng/mL) Final   POC Cocaine UR 09/26/2021 Positive (A)  NONE DETECTED (Cut Off Level 300 ng/mL) Final   POC Methamphetamine UR 09/26/2021 None Detected  NONE DETECTED (Cut Off Level 1000 ng/mL) Final   POC Morphine 09/26/2021 None Detected  NONE DETECTED (Cut Off Level 300 ng/mL) Final   POC Oxycodone UR 09/26/2021 None Detected  NONE DETECTED (Cut Off Level 100 ng/mL) Final   POC Methadone UR 09/26/2021 None Detected  NONE DETECTED (Cut Off  Level 300 ng/mL) Final   POC Marijuana UR 09/26/2021 Positive (A)  NONE DETECTED (Cut Off Level 50 ng/mL) Final   SARS Coronavirus 2 Ag 09/26/2021 Negative  Negative Final  Admission on 09/26/2021, Discharged on 09/26/2021  Component Date Value Ref Range Status   Sodium 09/26/2021 136  135 - 145 mmol/L Final   Potassium 09/26/2021 4.2  3.5 - 5.1 mmol/L Final   Chloride 09/26/2021 103  98 - 111 mmol/L Final   CO2 09/26/2021 26  22 - 32 mmol/L Final   Glucose, Bld 09/26/2021 93  70 - 99 mg/dL Final   Glucose reference range applies only to samples taken after fasting for at least 8 hours.   BUN 09/26/2021 14  6 - 20 mg/dL Final   Creatinine, Ser 09/26/2021 1.04  0.61 - 1.24 mg/dL Final   Calcium 54/62/7035 9.2  8.9 - 10.3 mg/dL Final   GFR, Estimated 09/26/2021 >60  >60 mL/min Final   Comment: (NOTE)  Calculated using the CKD-EPI Creatinine Equation (2021)    Anion gap 09/26/2021 7  5 - 15 Final   Performed at [facility], [address], [place], [place] [address]   WBC 09/26/2021 9.4  4.0 - 10.5 K/uL Final   RBC 09/26/2021 5.56  4.22 - 5.81 MIL/uL Final   Hemoglobin 09/26/2021 13.7  13.0 - 17.0 g/dL Final   HCT 32/67/1245 45.1  39.0 - 52.0 % Final   MCV 09/26/2021 81.1  80.0 - 100.0 fL Final   MCH 09/26/2021 24.6 (L)  26.0 - 34.0 pg Final   MCHC 09/26/2021 30.4  30.0 - 36.0 g/dL Final   RDW 80/99/8338 15.7 (H)  11.5 - 15.5 % Final   Platelets 09/26/2021 207  150 - 400 K/uL Final   nRBC 09/26/2021 0.0  0.0 - 0.2 % Final   Performed at [facility], [address], [place], [place] [address]   Troponin I (High Sensitivity) 09/26/2021 3  <18 ng/L Final   Comment: (NOTE) Elevated high sensitivity troponin I (hsTnI) values and significant  changes across serial measurements may suggest ACS but many other  chronic and acute conditions are known to elevate hsTnI results.  Refer to the "Links" section for chest pain algorithms and additional  guidance. Performed at [facility], [address] [place], [place] [address]     Blood Alcohol level:  Lab Results  Component Value Date   [facility] <10 09/26/2021   ETH <5 05/29/2017    Metabolic Disorder Labs: Lab Results  Component Value Date   HGBA1C 5.8 (A) 12/08/2020   No results found for: PROLACTIN Lab Results  Component Value Date   CHOL 187 09/26/2021   TRIG 96 09/26/2021   HDL 38 (L) 09/26/2021   CHOLHDL 4.9 09/26/2021   VLDL 19 09/26/2021   LDLCALC 130 (H) 09/26/2021   LDLCALC 154 (H) 12/08/2020    Therapeutic Lab Levels: No results found for: LITHIUM No results found for: VALPROATE No components found for:  CBMZ  Physical Findings   GAD-7    Flowsheet Row Office Visit from 12/08/2020 in [facility] Office Visit from 10/11/2019 in [facility] Office Visit from 07/12/2019 in [facility] Office Visit from 04/03/2018 in [facility] Office Visit from 02/20/2018 in [facility]  Total GAD-7 Score 1 0 1 1 6       PHQ2-9    Flowsheet Row ED from 09/26/2021 in [facility] Office Visit from 12/08/2020 in [facility] Office Visit from 10/11/2019 in [facility] Office Visit from 07/12/2019 in [facility] Office Visit from 01/10/2019 in [facility]  PHQ-2 Total Score 5 0 0 0 0  PHQ-9 Total Score 18 -- -- -- --      Flowsheet Row ED from 09/26/2021 in [facility] Most recent reading at 09/26/2021  9:00 PM ED from 09/26/2021 in [facility] EMERGENCY DEPARTMENT Most recent reading at 09/26/2021  1:55 PM ED from 09/26/2021 in [facility] Most recent reading at 09/26/2021 12:55 PM  C-SSRS RISK CATEGORY No Risk No Risk No Risk        Musculoskeletal  Strength & Muscle Tone: within normal  limits Gait & Station: normal Patient leans: N/A  Psychiatric Specialty Exam  Presentation  General Appearance:  Appropriate for Environment  Eye Contact:Fair  Speech:Clear and Coherent  Speech Volume:Normal  Handedness:No data recorded  Mood and Affect  Mood:Dysphoric  Affect:Congruent   Thought Process  Thought Processes:Coherent  Descriptions of Associations:Intact  Orientation:Full (Time, Place and Person)  Thought Content:Logical  Diagnosis of Schizophrenia or Schizoaffective disorder in past: No  Duration of Psychotic Symptoms: Greater than six months   Hallucinations:Hallucinations: None Description of Auditory Hallucinations: "chatter."  Ideas of Reference:None  Suicidal Thoughts:Suicidal Thoughts: No  Homicidal Thoughts:Homicidal Thoughts: No   Sensorium  Memory:Immediate Good; Recent Good; Remote Good  Judgment:Good  Insight:Fair   Executive Functions  Concentration:Fair  Attention Span:Fair  Recall:Fair  Fund of Knowledge:Fair  Language:Fair   Psychomotor Activity  Psychomotor Activity:Psychomotor Activity: Normal   Assets  Assets:Communication Skills; Desire for Improvement; Financial Resources/Insurance; Physical Health; Leisure Time; Housing   Sleep  Sleep:Sleep: Fair Number of Hours of Sleep: 2   Nutritional Assessment (For OBS and FBC admissions only) Has the patient had a weight loss or gain of 10 pounds or more in the last 3 months?: Yes Has the patient had a decrease in food intake/or appetite?: Yes Does the patient have dental problems?: No Does the patient have eating habits or behaviors that may be indicators of an eating disorder including binging or inducing vomiting?: No Has the patient recently lost weight without trying?: 4 Has the patient been eating poorly because of a decreased appetite?: 1 Malnutrition Screening Tool Score: 5 Nutritional Assessment Referrals: Refer to Social Work for [employer]    Physical Exam  Physical Exam Constitutional:      Appearance: Normal appearance.  HENT:     Head: Normocephalic.     Nose: Nose normal.  Eyes:     Conjunctiva/sclera: Conjunctivae normal.  Cardiovascular:     Rate and Rhythm: Normal rate.  Pulmonary:     Effort: Pulmonary effort is normal.  Musculoskeletal:     Cervical back: Normal range of motion.  Neurological:     Mental Status: He is alert and oriented to person, place, and time.   Review of Systems  Constitutional: Negative.   HENT: Negative.    Eyes: Negative.   Respiratory: Negative.    Cardiovascular: Negative.   Gastrointestinal: Negative.   Genitourinary: Negative.   Musculoskeletal: Negative.   Skin: Negative.   Neurological: Negative.   Endo/Heme/Allergies: Negative.   Blood pressure 123/75, pulse 71, temperature 97.7 F (36.5 C), temperature source Tympanic, resp. rate 18, SpO2 97 %. There is no height or weight on file to calculate BMI.  Treatment Plan Summary: Patient admitted to [facility] for medication management and safety.   Substance induced mood dx -Start: Neurontin 100 mg po TID for cocaine use  -Continue Risperdal 1 mg poi QHS -mood dx   HTN -Continue Lisinopril 10 mg po daily (patient's home regimen is 20 mg po daily).  Continue Propranolol 10 mg po BID (pateint's home regimen is 20 mg po BID).  Hyperlipidemia Lipitor 80 mg po daily     Layla Barter, NP 09/27/2021 10:44 AM

## 2021-09-28 LAB — HEMOGLOBIN A1C
Hgb A1c MFr Bld: 5.6 % (ref 4.8–5.6)
Mean Plasma Glucose: 114 mg/dL

## 2021-09-28 MED ORDER — ATORVASTATIN CALCIUM 80 MG PO TABS: 80.0000 mg | ORAL_TABLET | Freq: Every day | ORAL | 1 refills | Status: DC

## 2021-09-28 MED ORDER — NICOTINE 21 MG/24HR TD PT24: 21.0000 mg | MEDICATED_PATCH | Freq: Every day | TRANSDERMAL | 1 refills | Status: DC

## 2021-09-28 MED ORDER — RISPERIDONE 1 MG PO TABS: 1.0000 mg | ORAL_TABLET | Freq: Every day | ORAL | 1 refills | Status: AC

## 2021-09-28 MED ORDER — LISINOPRIL 10 MG PO TABS: 10.0000 mg | ORAL_TABLET | Freq: Every day | ORAL | 1 refills | Status: DC

## 2021-09-28 MED ORDER — ALBUTEROL SULFATE HFA 108 (90 BASE) MCG/ACT IN AERS: 2.0000 | INHALATION_SPRAY | RESPIRATORY_TRACT | 1 refills | Status: AC | PRN

## 2021-09-28 MED ORDER — GABAPENTIN 100 MG PO CAPS
100.0000 mg | ORAL_CAPSULE | Freq: Three times a day (TID) | ORAL | 1 refills | Status: AC
Start: 2021-09-28 — End: ?

## 2021-09-28 MED ORDER — TRAZODONE HCL 50 MG PO TABS
50.0000 mg | ORAL_TABLET | Freq: Every evening | ORAL | 1 refills | Status: AC | PRN
Start: 2021-09-28 — End: ?

## 2021-09-28 MED ORDER — PROPRANOLOL HCL 10 MG PO TABS: 10.0000 mg | ORAL_TABLET | Freq: Two times a day (BID) | ORAL | 1 refills | Status: AC

## 2021-09-28 MED ORDER — HYDROXYZINE HCL 25 MG PO TABS: 25.0000 mg | ORAL_TABLET | Freq: Three times a day (TID) | ORAL | 1 refills | Status: AC | PRN
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
## 2021-09-28 NOTE — ED Notes (Signed)
Patient is asleep in bed without distress or complaint.  Will monitor and meet needs accordingly.

## 2021-09-28 NOTE — Progress Notes (Signed)
Patient calm and pleasant on unit watching soccer with peers.  He has been informed that he will be discharging on Wednesday and is in agreement with that.  Will monitor.

## 2021-09-28 NOTE — ED Notes (Signed)
Patient denies SI,HI,AVH. Patient participating well in groups. Patient is cooperative and interacts well with staff. Respiratory is even and unlabored. No distress noted. Patient is in the dinning room watching television at present. Patient stated no complaints at present. will continue to monitor for safety.

## 2021-09-28 NOTE — ED Notes (Signed)
Pt is in bed sleeping in the bed. Respirations are even and unlabored. No acute distress noted. Will continue to monitor for safety.

## 2021-09-28 NOTE — Clinical Social Work Psych Note (Addendum)
CSW Initial Note    CSW met with patient for introduction and to begin discussions regarding treatment and potential discharge planning.   [name] shared that he came to the [facility] seeking treatment for his cocaine use. He also endorsed having ongoing depressive symptoms. [name] presented with a euthymic affect, congruent mood. He did not express having any increased depressive symptoms or anxiety at this time.   [name] reports having a history of depression, including suicide attempts, with his last attempt being in 2017. He reports walking in front of a car, that led to him being medically admitted to the hospital. He also shared that he has experienced auditory hallucinations in the past, however has not experienced any during this encounter.   [name] denied having any current SI, HI or AVH at this time.  [name] shared that his step-mother, who raised him, passed away 2 months ago. He reports ongoing grief and depression led to him relapsing on cocaine. [name] identified this event being the main trigger for his relapse. [name] reports being sober for 3 years prior to his step-mother's passing.   [name] endorsed using "a little more" than a gram of cocaine on a daily basis for the last two months.   [name] reports he currently lives in a boarding house, that is owned by a family friend who he considers a "mother figure". [name] expressed interest in residential treatment services following his discharge from the [facility]. CSW informed the patient on his potential options for treatment. [name] asked to be referred to [facility] in [place], [place].    Per [facility], the patient has been accepted for a Screening for Admission appointment on Wednesday, 09/30/2021 at 9:00am. Patient will require a 14 day worth of medications, in addition to his 30-day prescription of scheduled medications.   CSW will also provide additional resources for potential outpatient services.    Dr. Serafina Mitchell,  MD notified Curtis Ou, RN notified.        [name], MSW, LCSW Clinical Education officer, museum ([place]) [facility]

## 2021-09-28 NOTE — ED Notes (Signed)
Pt asleep in bed. Respirations even and unlabored. Will continue to monitor for safety. ?

## 2021-09-28 NOTE — Progress Notes (Signed)
Patient is awake sitting in dayroom watching tv with peer.  He met with MD and Education officer, museum.  No complaints or distress.  Denies active suicidal ideation or plan.  Will monitor and provide support as needed.

## 2021-09-29 NOTE — ED Provider Notes (Signed)
FBC/OBS ASAP Discharge Summary  Date and Time: 09/29/2021 4:32 PM  Name: [name]  MRN:  [id number]   Discharge Diagnoses:  Final diagnoses:  Substance induced mood disorder (HCC)  Cocaine abuse (HCC)  Insomnia due to other mental disorder  Bipolar affective disorder, currently depressed, moderate (HCC)    Subjective:  Patient seen and chart reviewed-patient has been medication compliant, attending groups on the unit, and has been appropriate with peers and staff on the unit.  Patient interviewed in his room this morning.  Patient describes his mood as "great".  Affect is brighter than previous.  Patient reports sleeping and eating well.  Patient denies all physical complaints.  Patient denies SI/HI/AVH and expresses excitement about going to day [facility] tomorrow for substance use treatment.  Patient expresses gratitude for help he has received while at the [facility] and indicates that the groups been helpful in particular the AA meetings.  Patient denies all medication side effects.  Explained to patient safe transport will pick him up tomorrow morning and transport him to day [place].  Patient verbalizes understanding.  Informed patient that social work spoke to his landlord and that his landlord will be dropping off some close to day prior to discharge tomorrow morning.  Patient verbalized understanding.   Stay Summary:  40 year old male with history of bipolar disorder, PTSD and cannabis use who [facility] for assistance for depression and cocaine use.  Patient was admitted to the [facility] for continued treatment.  Patient was restarted on home medications.UDS+cocaine.  Throughout stay patient was compliant with scheduled medications , was not a management issue on the unit and was appropriate with peers and staff throughout his stay.  Patient's mood improved after restarting her medications and allowing time for cocaine washout/metabolization.  Patient cited relapse on cocaine is his  biggest issue and requested residential treatment.  Patient was accepted to day [facility] on the morning of 11/30.  Patient was provided with 14-day samples of home medications (see below for complete list) as well as 30-day prescription with 1 refill.  Total Time spent with patient: 15 minutes  Past Psychiatric History: bipolar disorder, PTSD, cannabis use Past Medical History:  Past Medical History:  Diagnosis Date   Bipolar 1 disorder (HCC)    Hypertension 08/08/2017   History reviewed. No pertinent surgical history. Family History:  Family History  Adopted: Yes  Family history unknown: Yes   Family Psychiatric History: no hx reported Social History:  Social History   Substance and Sexual Activity  Alcohol Use No     Social History   Substance and Sexual Activity  Drug Use Yes   Types: Marijuana   Comment: daily    Social History   Socioeconomic History   Marital status: Single    Spouse name: Not on file   Number of children: Not on file   Years of education: Not on file   Highest education level: Not on file  Occupational History   Not on file  Tobacco Use   Smoking status: Every Day    Packs/day: 0.50    Types: Cigarettes   Smokeless tobacco: Never   Tobacco comments:    down to 4 cigarettes a day   Vaping Use   Vaping Use: Never used  Substance and Sexual Activity   Alcohol use: No   Drug use: Yes    Types: Marijuana    Comment: daily   Sexual activity: Yes    Birth control/protection: Condom  Other Topics Concern   Not on file  Social History Narrative   Not on file   Social Determinants of Health   Financial Resource Strain: Not on file  Food Insecurity: Not on file  Transportation Needs: Not on file  Physical Activity: Not on file  Stress: Not on file  Social Connections: Not on file   SDOH:  SDOH Screenings   Alcohol Screen: Not on file  Depression (PHQ2-9): Medium Risk   PHQ-2 Score: 18  Financial Resource Strain: Not on file  Food  Insecurity: Not on file  Housing: Not on file  Physical Activity: Not on file  Social Connections: Not on file  Stress: Not on file  Tobacco Use: High Risk   Smoking Tobacco Use: Every Day   Smokeless Tobacco Use: Never   Passive Exposure: Not on file  Transportation Needs: Not on file    Tobacco Cessation:  A prescription for an FDA-approved tobacco cessation medication provided at discharge  Current Medications:  Current Facility-Administered Medications  Medication Dose Route Frequency Provider Last Rate Last Admin   acetaminophen (TYLENOL) tablet 650 mg  650 mg Oral Q6H PRN White, Patrice L, NP       albuterol (VENTOLIN HFA) 108 (90 Base) MCG/ACT inhaler 2 puff  2 puff Inhalation Q4H PRN White, Patrice L, NP   2 puff at 09/29/21 1119   alum & mag hydroxide-simeth (MAALOX/MYLANTA) 200-200-20 MG/5ML suspension 30 mL  30 mL Oral Q4H PRN White, Patrice L, NP       atorvastatin (LIPITOR) tablet 80 mg  80 mg Oral Daily White, Patrice L, NP   80 mg at 09/29/21 0936   gabapentin (NEURONTIN) capsule 100 mg  100 mg Oral TID White, Patrice L, NP   100 mg at 09/29/21 1613   hydrOXYzine (ATARAX/VISTARIL) tablet 25 mg  25 mg Oral TID PRN White, Patrice L, NP       lisinopril (ZESTRIL) tablet 10 mg  10 mg Oral Daily White, Patrice L, NP   10 mg at 09/29/21 0936   magnesium hydroxide (MILK OF MAGNESIA) suspension 30 mL  30 mL Oral Daily PRN White, Patrice L, NP       nicotine (NICODERM CQ - dosed in mg/24 hours) patch 21 mg  21 mg Transdermal Daily White, Patrice L, NP   21 mg at 09/29/21 0936   propranolol (INDERAL) tablet 10 mg  10 mg Oral BID White, Patrice L, NP   10 mg at 09/29/21 0936   risperiDONE (RISPERDAL) tablet 1 mg  1 mg Oral QHS White, Patrice L, NP   1 mg at 09/28/21 2127   traZODone (DESYREL) tablet 50 mg  50 mg Oral QHS PRN White, Patrice L, NP       Current Outpatient Medications  Medication Sig Dispense Refill   albuterol (VENTOLIN HFA) 108 (90 Base) MCG/ACT inhaler Inhale 2  puffs into the lungs every 4 (four) hours as needed for wheezing or shortness of breath. 8 g 1   atorvastatin (LIPITOR) 80 MG tablet Take 1 tablet (80 mg total) by mouth daily. 30 tablet 1   gabapentin (NEURONTIN) 100 MG capsule Take 1 capsule (100 mg total) by mouth 3 (three) times daily. 90 capsule 1   hydrOXYzine (ATARAX/VISTARIL) 25 MG tablet Take 1 tablet (25 mg total) by mouth 3 (three) times daily as needed for anxiety. 30 tablet 1   lisinopril (ZESTRIL) 10 MG tablet Take 1 tablet (10 mg total) by mouth daily. 30 tablet 1  nicotine (NICODERM CQ - DOSED IN MG/24 HOURS) 21 mg/24hr patch Place 1 patch (21 mg total) onto the skin daily. 28 patch 1   propranolol (INDERAL) 10 MG tablet Take 1 tablet (10 mg total) by mouth 2 (two) times daily. 60 tablet 1   risperiDONE (RISPERDAL) 1 MG tablet Take 1 tablet (1 mg total) by mouth at bedtime. 30 tablet 1   traZODone (DESYREL) 50 MG tablet Take 1 tablet (50 mg total) by mouth at bedtime as needed for sleep. 30 tablet 1    PTA Medications: (Not in a hospital admission)   Musculoskeletal  Strength & Muscle Tone: within normal limits Gait & Station: normal Patient leans: N/A  Psychiatric Specialty Exam  Presentation  General Appearance: Appropriate for Environment; Casual  Eye Contact:Good  Speech:Clear and Coherent; Normal Rate  Speech Volume:Normal  Handedness:No data recorded  Mood and Affect  Mood:-- ("great")  Affect:Appropriate; Congruent. bright   Thought Process  Thought Processes:Coherent; Goal Directed; Linear  Descriptions of Associations:Intact  Orientation:Full (Time, Place and Person)  Thought Content:WDL; Logical  Diagnosis of Schizophrenia or Schizoaffective disorder in past: No    Hallucinations:Hallucinations: None  Ideas of Reference:None  Suicidal Thoughts:Suicidal Thoughts: No  Homicidal Thoughts:Homicidal Thoughts: No   Sensorium  Memory:Immediate Good; Recent Good; Remote  Good  Judgment:Good  Insight:Good   Executive Functions  Concentration:Good  Attention Span:Good  Recall:Good  Fund of Knowledge:Good  Language:Good   Psychomotor Activity  Psychomotor Activity:Psychomotor Activity: Normal   Assets  Assets:Communication Skills; Desire for Improvement; Financial Resources/Insurance; Resilience; Physical Health; Social Support; Housing   Sleep  Sleep:Sleep: Good   No data recorded  Physical Exam  Physical Exam Constitutional:      Appearance: Normal appearance. He is normal weight.  HENT:     Head: Normocephalic and atraumatic.  Eyes:     Extraocular Movements: Extraocular movements intact.  Pulmonary:     Effort: Pulmonary effort is normal.  Neurological:     General: No focal deficit present.     Mental Status: He is alert and oriented to person, place, and time.  Psychiatric:        Attention and Perception: Attention and perception normal.        Speech: Speech normal.        Behavior: Behavior normal. Behavior is cooperative.        Thought Content: Thought content normal.   Review of Systems  Constitutional:  Negative for chills and fever.  HENT:  Negative for hearing loss.   Eyes:  Negative for discharge and redness.  Respiratory:  Negative for cough.   Cardiovascular:  Negative for chest pain.  Gastrointestinal:  Negative for abdominal pain.  Musculoskeletal:  Negative for myalgias.  Neurological:  Negative for headaches.  Psychiatric/Behavioral:  Positive for substance abuse. Negative for depression, hallucinations and suicidal ideas.   Blood pressure 109/78, pulse 70, temperature (!) 97.3 F (36.3 C), temperature source Tympanic, resp. rate 16, SpO2 99 %. There is no height or weight on file to calculate BMI.  Demographic Factors:  Male, Caucasian, and Low socioeconomic status  Loss Factors: Loss of significant relationship  Historical Factors: Prior suicide attempts  Risk Reduction Factors:   Living  with another person, especially a relative, Positive social support, and Positive therapeutic relationship  Continued Clinical Symptoms:  Bipolar Disorder:   Depressive phase Alcohol/Substance Abuse/Dependencies  Cognitive Features That Contribute To Risk:  None    Suicide Risk:  Minimal: No identifiable suicidal ideation.  Patients presenting with no  risk factors but with morbid ruminations; may be classified as minimal risk based on the severity of the depressive symptoms  Plan Of Care/Follow-up recommendations:  Activity:  as tolerated Diet:  regular Other:    Patient is instructed prior to discharge to: Take all medications as prescribed by his/her mental healthcare provider. Report any adverse effects and or reactions from the medicines to his/her outpatient provider promptly. Patient has been instructed & cautioned: To not engage in alcohol and or illegal drug use while on prescription medicines. In the event of worsening symptoms, patient is instructed to call the crisis hotline, 911 and or go to the nearest ED for appropriate evaluation and treatment of symptoms. To follow-up with his/her primary care provider for your other medical issues, concerns and or health care needs.    albuterol 108 (90 Base) MCG/ACT inhaler Commonly known as: VENTOLIN HFA Inhale 2 puffs into the lungs every 4 (four) hours as needed for wheezing or shortness of breath.   atorvastatin 80 MG tablet Commonly known as: LIPITOR Take 1 tablet (80 mg total) by mouth daily. What changed: medication strength   gabapentin 100 MG capsule Commonly known as: NEURONTIN Take 1 capsule (100 mg total) by mouth 3 (three) times daily.   hydrOXYzine 25 MG tablet Commonly known as: ATARAX Take 1 tablet (25 mg total) by mouth 3 (three) times daily as needed for anxiety. What changed:  when to take this reasons to take this   lisinopril 10 MG tablet Commonly known as: ZESTRIL Take 1 tablet (10 mg total) by  mouth daily. What changed:  medication strength how much to take   nicotine 21 mg/24hr patch Commonly known as: NICODERM CQ - dosed in mg/24 hours Place 1 patch (21 mg total) onto the skin daily.   propranolol 10 MG tablet Commonly known as: INDERAL Take 1 tablet (10 mg total) by mouth 2 (two) times daily. What changed:  medication strength how much to take   risperiDONE 1 MG tablet Commonly known as: RISPERDAL Take 1 tablet (1 mg total) by mouth at bedtime.   traZODone 50 MG tablet Commonly known as: DESYREL Take 1 tablet (50 mg total) by mouth at bedtime as needed for sleep. What changed:  medication strength how much to take how to take this when to take this reasons to take this additional instructions       Patient provided with 14-day samples of above medications as well as paper prescription for 30 days with 1 refill.  Disposition: [facility] for residential substance use treatment  Estella Husk, MD 09/29/2021, 4:32 PM

## 2021-09-29 NOTE — ED Notes (Signed)
Pt sitting in dayroom, finished lunch, watching tv in no acute distress. Interacting well with peers and staff. Safety maintained.

## 2021-09-29 NOTE — Group Note (Signed)
Group Topic: Understanding Self  Group Date: 09/29/2021 Start Time: 1030 End Time: 1144 Facilitators: Alexandria Lodge D, NT  Department: [facility] LCSW Group Therapy Note   Group Date: @GROUPDATE @ Start Time: @GROUPSTARTTIME @ End Time: @GROUPENDTIME @  Type of Therapy and Topic:  Group Therapy:  Feelings around Relapse and Recovery  Participation Level:  Active   Mood:  Description of Group:    Patients in this group will discuss emotions they experience before and after a relapse. They will process how experiencing these feelings, or avoidance of experiencing them, relates to having a relapse. Facilitator will guide patients to explore emotions they have related to recovery. Patients will be encouraged to process which emotions are more powerful. They will be guided to discuss the emotional reaction significant others in their lives may have to patients' relapse or recovery. Patients will be assisted in exploring ways to respond to the emotions of others without this contributing to a relapse.  Therapeutic Goals: Patient will identify two or more emotions that lead to relapse for them:  Patient will identify two emotions that result when they relapse:  Patient will identify two emotions related to recovery:  Patient will demonstrate ability to communicate their needs through discussion and/or role plays.   Summary of Patient Progress:  Everyone participated and responding accordingly.    Therapeutic Modalities:   Cognitive Behavioral Therapy Solution-Focused Therapy Assertiveness Training Relapse Prevention Therapy   , NTNumber of Participants: 3  Group Focus: acceptance, communication, coping skills, depression, and feeling awareness/expression Treatment Modality:  Behavior Modification Therapy Interventions utilized were clarification, exploration, group exercise, and patient education Purpose: enhance coping  skills, express feelings, and improve communication skills  Name: [name] Date of Birth: 06/19/1981  MR: [name]    Level of Participation: active Quality of Participation: attentive, cooperative, and motivated Interactions with others: gave feedback Mood/Affect: appropriate, bright, and positive Triggers (if applicable): life stressors.  Cognition: concrete and goal directed Progress: Minimal Response: fell off the wagon due to God mother passing.  Plan: patient will be encouraged to stick to small goals and journal.   Patients Problems:  Patient Active Problem List   Diagnosis Date Noted   Substance induced mood disorder (HCC) 09/27/2021   Tobacco use disorder 08/08/2017   Hypertension 08/08/2017   Insomnia 08/08/2017   Viral syndrome 10/22/2013

## 2021-09-29 NOTE — ED Notes (Signed)
Pt is resting in the bed. Respirations are even and unlabored. No acute distress noted. Will continue to monitor for safety. °

## 2021-09-29 NOTE — ED Notes (Signed)
Patient denies SI,HI,AVH.  Patient is cooperative and interacts well with staff and peer. Respiratory is even and unlabored. No signs of distress noted. Patient is watching TV in the dinning room at present. Patient stated no complaints at present. will continue to monitor for safety.

## 2021-09-29 NOTE — Clinical Social Work Psych Note (Signed)
Self-Sabotaging Behaviors  Self-Sabotaging Behaviors & Self Awareness   Date: 09/29/21  Type of Therapy and Topic:   Participation Level: Active   Objective: In this group, patients will learn how to identify obstacles, self-sabotaging and enabling behaviors, as well as: what are they, why do we do them and what needs these behaviors meet. Discuss unhealthy relationships and how to have positive healthy boundaries with those that sabotage and enable. Explore aspects of self-sabotage and enabling in yourself and how to limit these self-destructive behaviors in everyday life.  Therapeutic Goals:  Patient will identify one obstacle that relates to self-sabotage and enabling behaviors Patient will identify one personal self-sabotaging or enabling behavior they did prior to admission Patient will state a plan to change the above identified behavior Patient will demonstrate ability to communicate their needs through discussion and/or role play.   Summary of Patient's Progress:  Jaiel received worksheets addressing the objective and therapeutic goals listed above.

## 2021-09-29 NOTE — ED Notes (Addendum)
Pt attended group 

## 2021-09-29 NOTE — ED Notes (Signed)
The patient came to this nurse while medication were being dispensed and asked if he can smoke cigarettes at [facility]. This nurse advised the patient that I am not sure however I will look it up. This nurse looked it up on the [facility] website and per the [facility] Website cigarette smoking is prohibited on [facility] campuses. This nurse advised the patient of the following. The patient then stated that he no longer wants to to go [facility]. He stated that he is already fighting addiction due to drugs and there is no way that he is going to stop smoking cigarettes. The patient stated he had been smoking cigarettes since the age of 41 and he will not stop any time soon. The patient asked was there anywhere else he could go. This nurse asked the patient if he wanted to sleep on it and speak with a social worker about it in the morning and then make a decision. The patient stated no he did not want to sleep on it and that he was sure he does not want to go to [facility]. The patient was advised of [place] by Laury Deep (tech on duty) and that he (the patient) can smoke cigarettes on [employer]. The patient asked if this nurse could let the social worker know that he would like to go to [place]. This nurse stated she would let the oncoming nurses know in report.

## 2021-09-29 NOTE — ED Notes (Signed)
Pt denies concerns at present. Sitting in lobby talking with peer. No acute distress noted. Safety maintained.

## 2021-09-29 NOTE — ED Notes (Signed)
Pt is currently sleeping, no distress noted, environmental check complete, will continue to monitor patient for safety. ? ?

## 2021-09-29 NOTE — ED Notes (Signed)
Snacks given 

## 2021-09-29 NOTE — ED Notes (Signed)
Patient is presently asleep in bed.  No complaints at present.  Will monitor.

## 2021-09-29 NOTE — ED Notes (Signed)
Pt is sleeping in the bed. Respirations are even and unlabored. No acute distress noted. Will continue to monitor for safety. °

## 2021-09-30 ENCOUNTER — Telehealth (HOSPITAL_COMMUNITY): Payer: Self-pay | Admitting: Primary Care

## 2021-09-30 NOTE — ED Notes (Signed)
6 am vitals have been taken on patient

## 2021-09-30 NOTE — ED Notes (Signed)
Pt is currently sleeping, no distress noted, environmental check complete, will continue to monitor patient for safety. ? ?

## 2021-09-30 NOTE — Progress Notes (Signed)
Pt is awake, alert and oriented. Pt did not voice any complaints of pain or discomfort. Administered scheduled meds with no incident. Pt denies current SI/HI/AVH. Staff will monitor for pt's safety.

## 2021-09-30 NOTE — Clinical Social Work Psych Note (Signed)
CSW Discharge Note   CSW and Dr. Serafina Mitchell, MD received report that the patient DECLINED his scheduled assessment at [facility], this morning.   CSW and Dr. Serafina Mitchell, MD met with [name] to discuss this matter further. [name] reports that he no longer wanted to participate at [facility] residential program due to not being able to smoke cigarettes while in treatment. [name] reports "there is no way I can stop smoking for that long, while trying to overcome another addiction with crack".    [name] reports he would like to discharge back to his previous living arrangements, at "his mother's" boarding home. [name] denied having any SI, HI or AVH at this time. He also reports being in a "good mood".   [name] requested assistance for transportation at discharge. CSW explained that [name] [employer] department would be able to provide that service.   [name] reports he does not have any additional concerns or questions at this time and thanked CSW and MD for his stay.   CSW charted outpatient psychiatric and primary care service appointments in the patient's AVS.   RN staff notified.    CSW will continue to follow until discharge.    [name], MSW, LCSW Clinical Education officer, museum ([place]) [facility]

## 2021-09-30 NOTE — Progress Notes (Signed)
Pt stated that he does not want to go to [facility].

## 2021-09-30 NOTE — Discharge Summary (Signed)
Haywood Lasso to be D/C'd Home per MD order. Discussed with the patient and all questions fully answered. An After Visit Summary was printed and given to the patient. Medication samples and scripts were also given to patient. Patient escorted out and D/C home via safe transport. Dickie La  09/30/2021 10:48 AM

## 2021-09-30 NOTE — BH Assessment (Signed)
Care Management - Follow Up Discharges   Writer attempted to make contact with patient today and was unsuccessful.  Writer left a HIPPA compliant message with a family friend that answered the phone.    Per chart review, patient declined an assessment at [facility] on 09-30-21 because he is not able to smoke cigarettes at the facility. .  Per chart review, patient has a scheduled follow up appointment with his established provider Dr. Lolly Mustache on 11-17-2021 as well as with [facility].

## 2021-09-30 NOTE — ED Provider Notes (Signed)
Patient was to be discharged this morning to day [facility]; however, patient declined to go to day [facility] this morning due to inability to smoke at the facility.  Patient states that he was informed by a staff member yesterday that smoking is allowed at [facility] and he would prefer to go to this facility as opposed to continue [place] as he feels that he would be unable to not smoke with 90 days even with the assistance of nicotine patch and nicotine gum.Marland Kitchen  He denies  SI/HI/AVH.  He describes his mood as "alright".  Patient states that he would like to discharge to his boardinghouse and plans to contact [facility] upon discharge to set up substance use treatment at their facility as they allow smoking.  Patient request safe transport to his boardinghouse.  See discharge summary for full list of medications-patient discharged with 14-day samples and 30-day prescription with 1 refill.

## 2021-10-01 ENCOUNTER — Encounter (HOSPITAL_COMMUNITY): Payer: Self-pay | Admitting: Behavioral Health

## 2021-10-02 ENCOUNTER — Other Ambulatory Visit: Payer: Self-pay

## 2021-10-02 ENCOUNTER — Encounter: Payer: Self-pay | Admitting: Cardiovascular Disease

## 2021-10-02 ENCOUNTER — Ambulatory Visit (INDEPENDENT_AMBULATORY_CARE_PROVIDER_SITE_OTHER): Payer: Medicaid Other | Admitting: Cardiovascular Disease

## 2021-10-02 VITALS — BP 132/82 | HR 76 | Ht 71.0 in | Wt 239.0 lb

## 2021-10-02 DIAGNOSIS — R079 Chest pain, unspecified: Secondary | ICD-10-CM | POA: Diagnosis not present

## 2021-10-02 NOTE — Patient Instructions (Signed)
Medication Instructions:  Your physician recommends that you continue on your current medications as directed. Please refer to the Current Medication list given to you today.  *If you need a refill on your cardiac medications before your next appointment, please call your pharmacy*   Lab Work: NONE If you have labs (blood work) drawn today and your tests are completely normal, you will receive your results only by: MyChart Message (if you have MyChart) OR A paper copy in the mail If you have any lab test that is abnormal or we need to change your treatment, we will call you to review the results.   Testing/Procedures: NONE   Follow-Up: At [facility], you and your health needs are our priority.  As part of our continuing mission to provide you with exceptional heart care, we have created designated Provider Care Teams.  These Care Teams include your primary Cardiologist (physician) and Advanced Practice Providers (APPs -  Physician Assistants and Nurse Practitioners) who all work together to provide you with the care you need, when you need it.  We recommend signing up for the patient portal called "MyChart".  Sign up information is provided on this After Visit Summary.  MyChart is used to connect with patients for Virtual Visits (Telemedicine).  Patients are able to view lab/test results, encounter notes, upcoming appointments, etc.  Non-urgent messages can be sent to your provider as well.   To learn more about what you can do with MyChart, go to [email or web address].    Your next appointment:   1 year(s)  The format for your next appointment:   In Person  Provider:   DR. NISHAN   

## 2021-10-02 NOTE — Progress Notes (Signed)
CARDIOLOGY CONSULT NOTE       Patient ID: [name] MRN: [id number] DOB/AGE: 11/17/1980 40 y.o.  Admit date: (Not on file) Referring Physician: [facility] Primary Physician: Grayce Sessions, NP Primary Cardiologist: New Reason for Consultation: Chest Pain  Active Problems:   * No active hospital problems. *   HPI:  40 y.o. referred by [facility] ED Dr Molli Hazard for chest pain. Patient has had ongoing struggles with cocaine abuse Multiple recent admissions to [facility]. Active smoker since Age 7.  Seen in ED 09/26/21 with atypical chest pain right sided going to right arm Random and non exertional Resolve spontaneously Associated with cocaine use. More or less Adopted didn't really know his parents HTN and HLD not always compliant with meds  Living at boarding house ECG in ED and multiple recent visits always totally normal in sinus with no ST/T wave   ROS All other systems reviewed and negative except as noted above  Past Medical History:  Diagnosis Date   Bipolar 1 disorder (HCC)    Hypertension 08/08/2017    Family History  Adopted: Yes  Family history unknown: Yes    Social History   Socioeconomic History   Marital status: Single    Spouse name: Not on file   Number of children: Not on file   Years of education: Not on file   Highest education level: Not on file  Occupational History   Not on file  Tobacco Use   Smoking status: Every Day    Packs/day: 0.50    Types: Cigarettes   Smokeless tobacco: Never   Tobacco comments:    down to 4 cigarettes a day   Vaping Use   Vaping Use: Never used  Substance and Sexual Activity   Alcohol use: No   Drug use: Yes    Types: Marijuana    Comment: daily   Sexual activity: Yes    Birth control/protection: Condom  Other Topics Concern   Not on file  Social History Narrative   ** Merged History Encounter **       Social Determinants of Health   Financial Resource Strain: Not on file  Food Insecurity: Not  on file  Transportation Needs: Not on file  Physical Activity: Not on file  Stress: Not on file  Social Connections: Not on file  Intimate Partner Violence: Not on file    History reviewed. No pertinent surgical history.    Current Outpatient Medications:    albuterol (VENTOLIN HFA) 108 (90 Base) MCG/ACT inhaler, Inhale 2 puffs into the lungs every 4 (four) hours as needed for wheezing or shortness of breath., Disp: 8 g, Rfl: 1   atorvastatin (LIPITOR) 80 MG tablet, Take 1 tablet (80 mg total) by mouth daily., Disp: 30 tablet, Rfl: 1   gabapentin (NEURONTIN) 100 MG capsule, Take 1 capsule (100 mg total) by mouth 3 (three) times daily., Disp: 90 capsule, Rfl: 1   hydrOXYzine (ATARAX/VISTARIL) 25 MG tablet, Take 1 tablet (25 mg total) by mouth 3 (three) times daily as needed for anxiety., Disp: 30 tablet, Rfl: 1   lisinopril (ZESTRIL) 10 MG tablet, Take 1 tablet (10 mg total) by mouth daily., Disp: 30 tablet, Rfl: 1   propranolol (INDERAL) 10 MG tablet, Take 1 tablet (10 mg total) by mouth 2 (two) times daily., Disp: 60 tablet, Rfl: 1   risperiDONE (RISPERDAL) 1 MG tablet, Take 1 tablet (1 mg total) by mouth at bedtime., Disp: 30 tablet, Rfl: 1  traZODone (DESYREL) 50 MG tablet, Take 1 tablet (50 mg total) by mouth at bedtime as needed for sleep., Disp: 30 tablet, Rfl: 1   nicotine (NICODERM CQ - DOSED IN MG/24 HOURS) 21 mg/24hr patch, Place 1 patch (21 mg total) onto the skin daily. (Patient not taking: Reported on 10/02/2021), Disp: 28 patch, Rfl: 1    Physical Exam: Blood pressure 132/82, pulse 76, height 5\' 11"  (1.803 m), weight 239 lb (108.4 kg), SpO2 97 %.   Affect appropriate Healthy:  appears stated age HEENT: normal Neck supple with no adenopathy JVP normal no bruits no thyromegaly Lungs exp wheezing  Heart:  S1/S2 no murmur, no rub, gallop or click PMI normal Abdomen: benighn, BS positve, no tenderness, no AAA no bruit.  No HSM or HJR Distal pulses intact with no  bruits No edema Neuro non-focal Skin warm and dry No muscular weakness   Labs:   Lab Results  Component Value Date   WBC 9.4 09/26/2021   HGB 13.7 09/26/2021   HCT 45.1 09/26/2021   MCV 81.1 09/26/2021   PLT 207 09/26/2021    Recent Labs  Lab 09/26/21 1357 09/26/21 1745  NA 136  --   K 4.2  --   CL 103  --   CO2 26  --   BUN 14  --   CREATININE 1.04  --   CALCIUM 9.2  --   PROT  --  6.6  BILITOT  --  0.4  ALKPHOS  --  70  ALT  --  35  AST  --  27  GLUCOSE 93  --    No results found for: CKTOTAL, CKMB, CKMBINDEX, TROPONINI  Lab Results  Component Value Date   CHOL 187 09/26/2021   CHOL 210 (H) 12/08/2020   CHOL 218 (H) 01/10/2019   Lab Results  Component Value Date   HDL 38 (L) 09/26/2021   HDL 37 (L) 12/08/2020   HDL 34 (L) 01/10/2019   Lab Results  Component Value Date   LDLCALC 130 (H) 09/26/2021   LDLCALC 154 (H) 12/08/2020   LDLCALC 150 (H) 01/10/2019   Lab Results  Component Value Date   TRIG 96 09/26/2021   TRIG 105 12/08/2020   TRIG 169 (H) 01/10/2019   Lab Results  Component Value Date   CHOLHDL 4.9 09/26/2021   CHOLHDL 5.7 (H) 12/08/2020   CHOLHDL 6.4 (H) 01/10/2019   No results found for: LDLDIRECT    Radiology: DG Chest 2 View  Result Date: 09/26/2021 CLINICAL DATA:  Patient arrived by [facility] from [facility] with complaint of chest pain. Went to [facility] for detox from cocaine. Denies SI/HI. Alert and oriented, NAD EXAM: CHEST - 2 VIEW COMPARISON:  05/29/2017 FINDINGS: Cardiac silhouette is normal in size. Normal mediastinal and hilar contours. Clear lungs.  No pleural effusion or pneumothorax. Skeletal structures are intact. IMPRESSION: No active cardiopulmonary disease. Electronically Signed   By: 05/31/2017 M.D.   On: 09/26/2021 14:29    EKG: NSR normal    ASSESSMENT AND PLAN:  Chest Pain: atypical related to cocaine use normal ECG no need for further testing Smoking counseled on cessation has albuterol inhaler CXR 09/26/21  NAD Substance Abuse:  ongoing issues discussed risk of MI/Stroke and death Currently not going to any meetings or counseling   PRN f/u   Signed: 09/28/21 10/02/2021, 7:59 AM

## 2021-10-21 ENCOUNTER — Inpatient Hospital Stay (INDEPENDENT_AMBULATORY_CARE_PROVIDER_SITE_OTHER): Payer: Medicaid Other | Admitting: Primary Care

## 2021-11-17 ENCOUNTER — Other Ambulatory Visit: Payer: Self-pay

## 2021-11-17 ENCOUNTER — Telehealth (HOSPITAL_COMMUNITY): Payer: Medicaid Other | Admitting: Psychiatry

## 2021-11-27 ENCOUNTER — Encounter (INDEPENDENT_AMBULATORY_CARE_PROVIDER_SITE_OTHER): Payer: Self-pay

## 2021-11-27 ENCOUNTER — Inpatient Hospital Stay (INDEPENDENT_AMBULATORY_CARE_PROVIDER_SITE_OTHER): Payer: Medicaid Other | Admitting: Primary Care

## 2022-03-31 ENCOUNTER — Emergency Department (HOSPITAL_COMMUNITY)
Admission: EM | Admit: 2022-03-31 | Discharge: 2022-03-31 | Disposition: A | Discharge status: Home / Self Care | Payer: Medicaid Other | Attending: Emergency Medicine | Admitting: Emergency Medicine

## 2022-03-31 ENCOUNTER — Other Ambulatory Visit: Payer: Self-pay

## 2022-03-31 ENCOUNTER — Emergency Department (HOSPITAL_COMMUNITY): Payer: Medicaid Other

## 2022-03-31 ENCOUNTER — Encounter (HOSPITAL_COMMUNITY): Payer: Self-pay

## 2022-03-31 DIAGNOSIS — I10 Essential (primary) hypertension: Secondary | ICD-10-CM | POA: Diagnosis not present

## 2022-03-31 DIAGNOSIS — N50819 Testicular pain, unspecified: Secondary | ICD-10-CM | POA: Diagnosis not present

## 2022-03-31 DIAGNOSIS — F17200 Nicotine dependence, unspecified, uncomplicated: Secondary | ICD-10-CM | POA: Diagnosis not present

## 2022-03-31 DIAGNOSIS — N433 Hydrocele, unspecified: Secondary | ICD-10-CM | POA: Diagnosis not present

## 2022-03-31 DIAGNOSIS — Z79899 Other long term (current) drug therapy: Secondary | ICD-10-CM | POA: Insufficient documentation

## 2022-03-31 DIAGNOSIS — N50812 Left testicular pain: Secondary | ICD-10-CM | POA: Insufficient documentation

## 2022-03-31 DIAGNOSIS — R1084 Generalized abdominal pain: Secondary | ICD-10-CM | POA: Diagnosis not present

## 2022-03-31 LAB — URINALYSIS, ROUTINE W REFLEX MICROSCOPIC
Bilirubin Urine: NEGATIVE
Glucose, UA: NEGATIVE mg/dL
Hgb urine dipstick: NEGATIVE
Ketones, ur: NEGATIVE mg/dL
Leukocytes,Ua: NEGATIVE
Nitrite: NEGATIVE
Protein, ur: NEGATIVE mg/dL
Specific Gravity, Urine: 1.01 (ref 1.005–1.030)
pH: 5 (ref 5.0–8.0)

## 2022-03-31 LAB — I-STAT CHEM 8, ED
BUN: 13 mg/dL (ref 6–20)
Calcium, Ion: 1.18 mmol/L (ref 1.15–1.40)
Chloride: 106 mmol/L (ref 98–111)
Creatinine, Ser: 0.8 mg/dL (ref 0.61–1.24)
Glucose, Bld: 97 mg/dL (ref 70–99)
HCT: 44 % (ref 39.0–52.0)
Hemoglobin: 15 g/dL (ref 13.0–17.0)
Potassium: 4 mmol/L (ref 3.5–5.1)
Sodium: 141 mmol/L (ref 135–145)
TCO2: 24 mmol/L (ref 22–32)

## 2022-03-31 LAB — HIV ANTIBODY (ROUTINE TESTING W REFLEX): HIV Screen 4th Generation wRfx: NONREACTIVE

## 2022-03-31 MED ORDER — HYDROMORPHONE HCL 1 MG/ML IJ SOLN
0.5000 mg | Freq: Once | INTRAMUSCULAR | Status: AC
  Administered 2022-03-31: 0.5 mg via INTRAVENOUS
  Filled 2022-03-31: qty 1

## 2022-03-31 NOTE — ED Provider Notes (Signed)
  Physical Exam  BP (!) 125/91   Pulse (!) 47   Temp 97.6 F (36.4 C)   Resp (!) 22   Ht 5\' 10"  (1.778 m)   Wt 107 kg   SpO2 99%   BMI 33.86 kg/m   Physical Exam Vitals and nursing note reviewed.  Constitutional:      Appearance: Normal appearance.  Neurological:     Mental Status: He is alert.    Procedures  Procedures  ED Course / MDM    Medical Decision Making Amount and/or Complexity of Data Reviewed Labs: ordered. Radiology: ordered.  Risk Prescription drug management.   Patient care assumed from [place] T. PA, please see his note for full HPI.  Briefly, patient here with acute onset of left testicular pain that started 830 this morning.  Describes a sharp stabbing feeling to his testicle.  Obtain ultrasound which was negative.  Plan is for pending UA, reassessment.  He did receive point fine of Dilaudid around 1:30 PM and has not reported worsening pain.  UA without any hemoglobin, no nitrates, no leukocytes or white blood cells to suggest infection.  Patient reevaluated by me, he reports resolution in his symptoms, I did discuss with him his UA results.  He was instructed to follow-up with urology if symptoms do return.  Or return back to the ED for worsening symptoms.  He is agreeable with plan and discharge.  Patient stable for discharge.    Portions of this note were generated with [employer]. Dictation errors may occur despite best attempts at proofreading.     Scientist, clinical (histocompatibility and immunogenetics), PA-C 03/31/22 1615    04/02/22, DO 04/01/22 1129

## 2022-03-31 NOTE — ED Triage Notes (Signed)
Pt coming from home with c/o left testicular pain. Pt says the pain started at about 8:30 this morning. Pt states that his left testicle is swollen and feels harder than his right testicle.

## 2022-03-31 NOTE — ED Provider Notes (Signed)
[facility]  HOSPITAL-EMERGENCY DEPT Provider Note   CSN: [id number] Arrival date & time: 03/31/22  1110     History  Chief Complaint  Patient presents with   Testicle Pain    [name] is a 41 y.o. male.  The history is provided by the patient and medical records. No language interpreter was used.  Testicle Pain   41 year old male with significant history bipolar, hypertension, polysubstance use, presenting complaint of testicle pain.  Patient reports developing acute onset of left testicular pain that started at 8:30 AM this morning.  Pain is sharp stabbing and he feels that his testicle was more swollen and felt harder than the right testicle.  Pain is radiates to L thigh.  No fever no dysuria hematuria no back pain.  Last sexual activity was 8 months ago.  Denies any recent trauma or heavy lifting.    Home Medications Prior to Admission medications   Medication Sig Start Date End Date Taking? Authorizing Provider  albuterol (VENTOLIN HFA) 108 (90 Base) MCG/ACT inhaler Inhale 2 puffs into the lungs every 4 (four) hours as needed for wheezing or shortness of breath. 09/28/21   Estella Husk, MD  atorvastatin (LIPITOR) 80 MG tablet Take 1 tablet (80 mg total) by mouth daily. 09/28/21 11/27/21  Estella Husk, MD  gabapentin (NEURONTIN) 100 MG capsule Take 1 capsule (100 mg total) by mouth 3 (three) times daily. 09/28/21   Estella Husk, MD  hydrOXYzine (ATARAX/VISTARIL) 25 MG tablet Take 1 tablet (25 mg total) by mouth 3 (three) times daily as needed for anxiety. 09/28/21   Estella Husk, MD  lisinopril (ZESTRIL) 10 MG tablet Take 1 tablet (10 mg total) by mouth daily. 09/29/21 11/28/21  Estella Husk, MD  nicotine (NICODERM CQ - DOSED IN MG/24 HOURS) 21 mg/24hr patch Place 1 patch (21 mg total) onto the skin daily. Patient not taking: Reported on 10/02/2021 09/29/21   Estella Husk, MD  propranolol (INDERAL) 10 MG tablet Take 1  tablet (10 mg total) by mouth 2 (two) times daily. 09/28/21   Estella Husk, MD  risperiDONE (RISPERDAL) 1 MG tablet Take 1 tablet (1 mg total) by mouth at bedtime. 09/28/21   Estella Husk, MD  traZODone (DESYREL) 50 MG tablet Take 1 tablet (50 mg total) by mouth at bedtime as needed for sleep. 09/28/21   Estella Husk, MD      Allergies    Patient has no known allergies.    Review of Systems   Review of Systems  Genitourinary:  Positive for testicular pain.  All other systems reviewed and are negative.  Physical Exam Updated Vital Signs BP (!) 163/110   Pulse (!) 53   Temp 97.6 F (36.4 C)   Resp 20   Ht 5\' 10"  (1.778 m)   Wt 107 kg   SpO2 100%   BMI 33.86 kg/m  Physical Exam Vitals and nursing note reviewed.  Constitutional:      General: He is not in acute distress.    Appearance: He is well-developed.  HENT:     Head: Atraumatic.  Eyes:     Conjunctiva/sclera: Conjunctivae normal.  Abdominal:     General: Abdomen is flat.     Palpations: Abdomen is soft.     Tenderness: There is no abdominal tenderness. There is no right CVA tenderness or left CVA tenderness.  Genitourinary:    Comments: Chaperone present during exam.  No inguinal lymphadenopathy or inguinal hernia  noted.  Normal circumcised penis free of lesion or rash.  Left testicle is tender to palpation and a bit more swollen compared to the right with normal lie.  Normal scrotum free of lesion or rash. Musculoskeletal:     Cervical back: Neck supple.  Skin:    Findings: No rash.  Neurological:     Mental Status: He is alert.    ED Results / Procedures / Treatments   Labs (all labs ordered are listed, but only abnormal results are displayed) Labs Reviewed  RPR  URINALYSIS, ROUTINE W REFLEX MICROSCOPIC  HIV ANTIBODY (ROUTINE TESTING W REFLEX)  I-STAT CHEM 8, ED  GC/CHLAMYDIA PROBE AMP ([place]) NOT AT [facility]    EKG None  Radiology US SCROTUM W/DOPPLER  Result Date:  03/31/2022 CLINICAL DATA:  Testicular pain EXAM: SCROTAL ULTRASOUND DOPPLER ULTRASOUND OF THE TESTICLES TECHNIQUE: Complete ultrasound examination of the testicles, epididymis, and other scrotal structures was performed. Color and spectral Doppler ultrasound were also utilized to evaluate blood flow to the testicles. COMPARISON:  None Available. FINDINGS: Right testicle Measurements: 3.6 x 2.5 x 2.9 cm. No mass or microlithiasis visualized. Left testicle Measurements: 4.1 x 3.9 x 3.5 cm. No mass or microlithiasis visualized. Right epididymis:  Normal in size and appearance. Left epididymis:  Normal in size and appearance. Hydrocele:  Small bilateral hydroceles. Varicocele:  None visualized. Pulsed Doppler interrogation of both testes demonstrates normal low resistance arterial and venous waveforms bilaterally. IMPRESSION: 1. Normal arteriovenous waveforms seen in the bilateral testicles. 2. Small bilateral hydroceles. Electronically Signed   By: Allegra Lai M.D.   On: 03/31/2022 13:34    Procedures Procedures    Medications Ordered in ED Medications  HYDROmorphone (DILAUDID) injection 0.5 mg (0.5 mg Intravenous Given 03/31/22 1327)    ED Course/ Medical Decision Making/ A&P                           Medical Decision Making Amount and/or Complexity of Data Reviewed Labs: ordered. Radiology: ordered.   BP 130/85   Pulse (!) 54   Temp 97.6 F (36.4 C)   Resp 20   Ht 5\' 10"  (1.778 m)   Wt 107 kg   SpO2 99%   BMI 33.86 kg/m   2:23 PM This is a 41 year old male presenting with complaints of atraumatic left testicular pain that started this morning.  Endorses severe pain about the left testicle radiates towards the left thigh which has been persistent.  His symptoms initially concerning for testicular torsion and therefore prompt ultrasound of scrotum was ordered.  The result was independently reviewed interpreted by me and I agree with radiologist interpretation Fortunately scrotal  ultrasound obtained showing blood flow to bilateral testicle with small bilateral hydroceles otherwise no other concerning finding noted.  No report of epididymitis, testicular torsion  After receiving opiate pain medication patient reports symptoms did improve.  On exam I do not appreciate any finding to suggest scrotal abscess, cellulitis, concerning rash, or inguinal hernia.  3:15 PM Pt sign out to oncoming team who will f/u on labs and UA.  If UA shows hemoglobin then consider CT renal stone study.  Otherwise anticipate discharge.   This patient presents to the ED for concern of testicular pain, this involves an extensive number of treatment options, and is a complaint that carries with it a high risk of complications and morbidity.  The differential diagnosis includes testicular torsion, epididymitis, orchitis, testicular cancer, scrotal abscess, cellulitis, kidney  stone  Co morbidities that complicate the patient evaluation none Additional history obtained:  Additional history obtained from patient External records from outside source obtained and reviewed [facility]  Lab Tests:  I Ordered, and personally interpreted labs.  The pertinent results include:  as above  Imaging Studies ordered:  I ordered imaging studies including scrotal us I independently visualized and interpreted imaging which showed no concerning finding I agree with the radiologist interpretation  Cardiac Monitoring:  The patient was maintained on a cardiac monitor.  I personally viewed and interpreted the cardiac monitored which showed an underlying rhythm of: sinus bradycardia  Medicines ordered and prescription drug management:  I ordered medication including dilaudid  for scrotal pain Reevaluation of the patient after these medicines showed that the patient improved I have reviewed the patients home medicines and have made adjustments as needed  Test Considered: as  above  Critical Interventions: IV opiate medication   Problem List / ED Course: testicular pain  Reevaluation:  After the interventions noted above, I reevaluated the patient and found that they have :improved  Social Determinants of Health: tobacco use          Final Clinical Impression(s) / ED Diagnoses Final diagnoses:  None    Rx / DC Orders ED Discharge Orders     None         Fayrene Helperran, Joyceline Maiorino, PA-C 03/31/22 1518    Pricilla LovelessGoldston, Scott, MD 04/02/22 [phone]

## 2022-03-31 NOTE — Discharge Instructions (Signed)
Your ultrasound today was negative.  Your urine results are normal, I have attached the number to urology if you wish to continue follow-up for your left testicular pain.  You may benefit from some over-the-counter anti-inflammatory such as naproxen, please take 1 tablet twice a day with food for pain control.

## 2022-04-01 ENCOUNTER — Telehealth: Payer: Self-pay

## 2022-04-01 LAB — RPR: RPR Ser Ql: NONREACTIVE

## 2022-04-01 NOTE — Telephone Encounter (Signed)
Transition Care Management Unsuccessful Follow-up Telephone Call  Date of discharge and from where:  03/31/2022-[facility]  Attempts:  1st Attempt  Reason for unsuccessful TCM follow-up call:  Left voice message    

## 2022-04-02 NOTE — Telephone Encounter (Signed)
Transition Care Management Unsuccessful Follow-up Telephone Call  Date of discharge and from where:  03/31/2022-[facility]  Attempts:  2nd Attempt  Reason for unsuccessful TCM follow-up call:  Left voice message

## 2022-04-05 NOTE — Telephone Encounter (Signed)
Transition Care Management Unsuccessful Follow-up Telephone Call  Date of discharge and from where:  03/31/2022-[facility]  Attempts:  3rd Attempt  Reason for unsuccessful TCM follow-up call:  Unable to reach patient

## 2022-08-25 ENCOUNTER — Ambulatory Visit (INDEPENDENT_AMBULATORY_CARE_PROVIDER_SITE_OTHER): Payer: Medicaid Other | Admitting: Primary Care

## 2022-08-30 ENCOUNTER — Telehealth (INDEPENDENT_AMBULATORY_CARE_PROVIDER_SITE_OTHER): Payer: Medicaid Other | Admitting: Primary Care

## 2022-08-30 NOTE — Progress Notes (Deleted)
    I connected with [name], on 08/30/2022 at 3:07 PM telephone and verified that I am speaking with the correct person using two identifiers.   Consent: I discussed the limitations, risks, security and privacy concerns of performing an evaluation and management service by telephone and the availability of in person appointments. I also discussed with the patient that there may be a patient responsible charge related to this service. The patient expressed understanding and agreed to proceed.   Location of Patient: Home  Location of Provider: [facility] [place]   Persons participating in Telemedicine visit: Darryl Willner Lawernce Ion,  NP  History of Present Illness: [name] is a 41 year old male that was advised to follow-up with PCP went to donate blood they found elevated red blood cells and was not sure why. Question wold unprotected sex cause elevation.  Past Medical History:  Diagnosis Date   Bipolar 1 disorder ([place])    Hypertension 08/08/2017   No Known Allergies  Current Outpatient Medications on File Prior to Visit  Medication Sig Dispense Refill   albuterol (VENTOLIN HFA) 108 (90 Base) MCG/ACT inhaler Inhale 2 puffs into the lungs every 4 (four) hours as needed for wheezing or shortness of breath. 8 g 1   atorvastatin (LIPITOR) 80 MG tablet Take 1 tablet (80 mg total) by mouth daily. 30 tablet 1   gabapentin (NEURONTIN) 100 MG capsule Take 1 capsule (100 mg total) by mouth 3 (three) times daily. 90 capsule 1   hydrOXYzine (ATARAX/VISTARIL) 25 MG tablet Take 1 tablet (25 mg total) by mouth 3 (three) times daily as needed for anxiety. 30 tablet 1   lisinopril (ZESTRIL) 10 MG tablet Take 1 tablet (10 mg total) by mouth daily. 30 tablet 1   nicotine (NICODERM CQ - DOSED IN MG/24 HOURS) 21 mg/24hr patch Place 1 patch (21 mg total) onto the skin daily. (Patient not taking: Reported on 10/02/2021) 28 patch 1   propranolol  (INDERAL) 10 MG tablet Take 1 tablet (10 mg total) by mouth 2 (two) times daily. 60 tablet 1   risperiDONE (RISPERDAL) 1 MG tablet Take 1 tablet (1 mg total) by mouth at bedtime. 30 tablet 1   traZODone (DESYREL) 50 MG tablet Take 1 tablet (50 mg total) by mouth at bedtime as needed for sleep. 30 tablet 1   No current facility-administered medications on file prior to visit.    Observations/Objective: ***  Assessment and Plan: ***  Follow Up Instructions: ***   I discussed the assessment and treatment plan with the patient. The patient was provided an opportunity to ask questions and all were answered. The patient agreed with the plan and demonstrated an understanding of the instructions.   The patient was advised to call back or seek an in-person evaluation if the symptoms worsen or if the condition fails to improve as anticipated.     I provided *** minutes total of non-face-to-face time during this encounter including median intraservice time, reviewing previous notes, investigations, ordering medications, medical decision making, coordinating care and patient verbalized understanding at the end of the visit.    This note has been created with Surveyor, quantity. Any transcriptional errors are unintentional.   Kerin Perna, NP 08/30/2022, 3:07 PM

## 2022-08-31 ENCOUNTER — Other Ambulatory Visit (HOSPITAL_COMMUNITY)
Admission: RE | Admit: 2022-08-31 | Discharge: 2022-08-31 | Disposition: A | Discharge status: Home / Self Care | Payer: Medicaid Other | Source: Ambulatory Visit | Attending: Family Medicine | Admitting: Family Medicine

## 2022-08-31 ENCOUNTER — Ambulatory Visit (INDEPENDENT_AMBULATORY_CARE_PROVIDER_SITE_OTHER): Payer: Medicaid Other

## 2022-08-31 DIAGNOSIS — I10 Essential (primary) hypertension: Secondary | ICD-10-CM | POA: Diagnosis not present

## 2022-08-31 DIAGNOSIS — E782 Mixed hyperlipidemia: Secondary | ICD-10-CM

## 2022-08-31 DIAGNOSIS — Z113 Encounter for screening for infections with a predominantly sexual mode of transmission: Secondary | ICD-10-CM | POA: Insufficient documentation

## 2022-08-31 DIAGNOSIS — Z1159 Encounter for screening for other viral diseases: Secondary | ICD-10-CM

## 2022-09-01 ENCOUNTER — Other Ambulatory Visit (INDEPENDENT_AMBULATORY_CARE_PROVIDER_SITE_OTHER): Payer: Self-pay | Admitting: Primary Care

## 2022-09-01 LAB — CBC WITH DIFFERENTIAL/PLATELET
Basophils Absolute: 0.1 10*3/uL (ref 0.0–0.2)
Basos: 1 %
EOS (ABSOLUTE): 0.2 10*3/uL (ref 0.0–0.4)
Eos: 2 %
Hematocrit: 49.7 % (ref 37.5–51.0)
Hemoglobin: 16.8 g/dL (ref 13.0–17.7)
Immature Grans (Abs): 0 10*3/uL (ref 0.0–0.1)
Immature Granulocytes: 0 %
Lymphocytes Absolute: 1.7 10*3/uL (ref 0.7–3.1)
Lymphs: 21 %
MCH: 29.8 pg (ref 26.6–33.0)
MCHC: 33.8 g/dL (ref 31.5–35.7)
MCV: 88 fL (ref 79–97)
Monocytes Absolute: 0.7 10*3/uL (ref 0.1–0.9)
Monocytes: 9 %
Neutrophils Absolute: 5.6 10*3/uL (ref 1.4–7.0)
Neutrophils: 67 %
Platelets: 182 10*3/uL (ref 150–450)
RBC: 5.63 x10E6/uL (ref 4.14–5.80)
RDW: 13.1 % (ref 11.6–15.4)
WBC: 8.2 10*3/uL (ref 3.4–10.8)

## 2022-09-01 LAB — CMP14+EGFR
ALT: 29 IU/L (ref 0–44)
AST: 20 IU/L (ref 0–40)
Albumin/Globulin Ratio: 1.9 (ref 1.2–2.2)
Albumin: 4.1 g/dL (ref 4.1–5.1)
Alkaline Phosphatase: 67 IU/L (ref 44–121)
BUN/Creatinine Ratio: 9 (ref 9–20)
BUN: 9 mg/dL (ref 6–24)
Bilirubin Total: 0.5 mg/dL (ref 0.0–1.2)
CO2: 23 mmol/L (ref 20–29)
Calcium: 9.7 mg/dL (ref 8.7–10.2)
Chloride: 105 mmol/L (ref 96–106)
Creatinine, Ser: 1.01 mg/dL (ref 0.76–1.27)
Globulin, Total: 2.2 g/dL (ref 1.5–4.5)
Glucose: 133 mg/dL — ABNORMAL HIGH (ref 70–99)
Potassium: 4.4 mmol/L (ref 3.5–5.2)
Sodium: 141 mmol/L (ref 134–144)
Total Protein: 6.3 g/dL (ref 6.0–8.5)
eGFR: 95 mL/min/{1.73_m2} (ref >59)

## 2022-09-01 LAB — LIPID PANEL
Chol/HDL Ratio: 4.6 ratio (ref 0.0–5.0)
Cholesterol, Total: 193 mg/dL (ref 100–199)
HDL: 42 mg/dL (ref >39)
LDL Chol Calc (NIH): 140 mg/dL — ABNORMAL HIGH (ref 0–99)
Triglycerides: 58 mg/dL (ref 0–149)
VLDL Cholesterol Cal: 11 mg/dL (ref 5–40)

## 2022-09-01 LAB — HCV INTERPRETATION

## 2022-09-01 LAB — HCV AB W REFLEX TO QUANT PCR: HCV Ab: NONREACTIVE

## 2022-09-01 MED ORDER — ATORVASTATIN CALCIUM 20 MG PO TABS: 20.0000 mg | ORAL_TABLET | Freq: Every day | ORAL | 1 refills | Status: AC

## 2022-09-02 LAB — URINE CYTOLOGY ANCILLARY ONLY
Chlamydia: NEGATIVE
Comment: NEGATIVE
Comment: NEGATIVE
Comment: NORMAL
Neisseria Gonorrhea: NEGATIVE
Trichomonas: NEGATIVE

## 2022-09-03 DIAGNOSIS — F3173 Bipolar disorder, in partial remission, most recent episode manic: Secondary | ICD-10-CM | POA: Diagnosis not present

## 2022-09-03 DIAGNOSIS — F209 Schizophrenia, unspecified: Secondary | ICD-10-CM | POA: Diagnosis not present

## 2022-09-03 DIAGNOSIS — F609 Personality disorder, unspecified: Secondary | ICD-10-CM | POA: Diagnosis not present

## 2022-09-03 MED ORDER — LISINOPRIL 10 MG PO TABS: 10.0000 mg | ORAL_TABLET | Freq: Every day | ORAL | 1 refills | Status: AC

## 2022-09-30 ENCOUNTER — Ambulatory Visit (INDEPENDENT_AMBULATORY_CARE_PROVIDER_SITE_OTHER): Payer: Medicaid Other | Admitting: Primary Care

## 2022-11-19 ENCOUNTER — Other Ambulatory Visit (INDEPENDENT_AMBULATORY_CARE_PROVIDER_SITE_OTHER): Payer: Self-pay | Admitting: Primary Care

## 2022-11-19 DIAGNOSIS — F209 Schizophrenia, unspecified: Secondary | ICD-10-CM | POA: Diagnosis not present

## 2022-11-19 DIAGNOSIS — I10 Essential (primary) hypertension: Secondary | ICD-10-CM

## 2022-11-23 DIAGNOSIS — F209 Schizophrenia, unspecified: Secondary | ICD-10-CM | POA: Diagnosis not present

## 2022-12-01 DIAGNOSIS — F209 Schizophrenia, unspecified: Secondary | ICD-10-CM | POA: Diagnosis not present

## 2023-06-11 ENCOUNTER — Emergency Department (HOSPITAL_COMMUNITY): Payer: MEDICAID

## 2023-06-11 ENCOUNTER — Other Ambulatory Visit: Payer: Self-pay

## 2023-06-11 ENCOUNTER — Encounter (HOSPITAL_COMMUNITY): Payer: Self-pay

## 2023-06-11 ENCOUNTER — Emergency Department (HOSPITAL_COMMUNITY)
Admission: EM | Admit: 2023-06-11 | Discharge: 2023-06-11 | Disposition: A | Discharge status: Home / Self Care | Payer: MEDICAID | Attending: Emergency Medicine | Admitting: Emergency Medicine

## 2023-06-11 DIAGNOSIS — N433 Hydrocele, unspecified: Secondary | ICD-10-CM | POA: Insufficient documentation

## 2023-06-11 DIAGNOSIS — Z79899 Other long term (current) drug therapy: Secondary | ICD-10-CM | POA: Insufficient documentation

## 2023-06-11 DIAGNOSIS — I10 Essential (primary) hypertension: Secondary | ICD-10-CM | POA: Diagnosis not present

## 2023-06-11 DIAGNOSIS — N50812 Left testicular pain: Secondary | ICD-10-CM | POA: Diagnosis present

## 2023-06-11 LAB — URINALYSIS, ROUTINE W REFLEX MICROSCOPIC
Bilirubin Urine: NEGATIVE
Glucose, UA: NEGATIVE mg/dL
Hgb urine dipstick: NEGATIVE
Ketones, ur: NEGATIVE mg/dL
Leukocytes,Ua: NEGATIVE
Nitrite: NEGATIVE
Protein, ur: NEGATIVE mg/dL
Specific Gravity, Urine: 1.015 (ref 1.005–1.030)
pH: 6 (ref 5.0–8.0)

## 2023-06-11 MED ORDER — MORPHINE SULFATE (PF) 4 MG/ML IV SOLN
4.0000 mg | Freq: Once | INTRAVENOUS | Status: AC
  Administered 2023-06-11: 4 mg via INTRAVENOUS
  Filled 2023-06-11: qty 1

## 2023-06-11 MED ORDER — IBUPROFEN 600 MG PO TABS: 600.0000 mg | ORAL_TABLET | Freq: Four times a day (QID) | ORAL | 0 refills | Status: DC | PRN

## 2023-06-11 NOTE — Discharge Instructions (Signed)
You have been evaluated for your symptoms.  Your pain is likely due to a hydrocele.  You may follow-up with urologist and you may possibly benefit from a hydrocelectomy if indicated.

## 2023-06-11 NOTE — ED Provider Notes (Signed)
[place] EMERGENCY DEPARTMENT AT [facility] Provider Note   CSN: [id number] Arrival date & time: 06/11/23  1252     History  Chief Complaint  Patient presents with   Swollen Testicle    [name] is a 42 y.o. male.  The history is provided by the patient and medical records. No language interpreter was used.     42 yo male with hx of bipolar 1 disorder and HTN presents to the ED today complaining of ~18 hours of L testicular pain and swelling, Patient notes he got up to use the bathroom last night and noticed shooting pain into his groin and down the inside of his L thigh. He felt his testicles and noticed swelling L>R as well as pain on the L testicle. Pain is constant and worsening. Endorses burning and dysuria when voiding last night, but that did not continue into today. Endorses difficulty sleeping due to pain. Symptoms are alleviated by walking and aggravated by sitting or laying still. Patient has tried no medication for symptom relief. Denies any trauma to the area or new sexual partners. Denies constipation, hematuria, back pain, SOB, chest pain, abdominal pain. He is not concerned for STDs at this time. No known abrasions, ulcerations, or skin lesions in the area.   Home Medications Prior to Admission medications   Medication Sig Start Date End Date Taking? Authorizing Provider  albuterol (VENTOLIN HFA) 108 (90 Base) MCG/ACT inhaler Inhale 2 puffs into the lungs every 4 (four) hours as needed for wheezing or shortness of breath. 09/28/21   Estella Husk, MD  atorvastatin (LIPITOR) 20 MG tablet Take 1 tablet (20 mg total) by mouth daily. 09/01/22   Grayce Sessions, NP  gabapentin (NEURONTIN) 100 MG capsule Take 1 capsule (100 mg total) by mouth 3 (three) times daily. 09/28/21   Estella Husk, MD  hydrOXYzine (ATARAX/VISTARIL) 25 MG tablet Take 1 tablet (25 mg total) by mouth 3 (three) times daily as needed for anxiety. 09/28/21   Estella Husk, MD  lisinopril (ZESTRIL) 10 MG tablet Take 1 tablet (10 mg total) by mouth daily. 09/03/22   Grayce Sessions, NP  nicotine (NICODERM CQ - DOSED IN MG/24 HOURS) 21 mg/24hr patch Place 1 patch (21 mg total) onto the skin daily. Patient not taking: Reported on 10/02/2021 09/29/21   Estella Husk, MD  propranolol (INDERAL) 10 MG tablet Take 1 tablet (10 mg total) by mouth 2 (two) times daily. 09/28/21   Estella Husk, MD  risperiDONE (RISPERDAL) 1 MG tablet Take 1 tablet (1 mg total) by mouth at bedtime. 09/28/21   Estella Husk, MD  traZODone (DESYREL) 50 MG tablet Take 1 tablet (50 mg total) by mouth at bedtime as needed for sleep. 09/28/21   Estella Husk, MD      Allergies    Patient has no known allergies.    Review of Systems   Review of Systems  All other systems reviewed and are negative.   Physical Exam Updated Vital Signs BP (!) 133/101 (BP Location: Right Arm)   Pulse 61   Temp 98.3 F (36.8 C)   Resp 14   Ht 5\' 11"  (1.803 m)   Wt 97.5 kg   SpO2 99%   BMI 29.99 kg/m  Physical Exam Vitals and nursing note reviewed.  Constitutional:      General: He is not in acute distress.    Appearance: He is well-developed.  HENT:  Head: Atraumatic.  Eyes:     Conjunctiva/sclera: Conjunctivae normal.  Abdominal:     Palpations: Abdomen is soft.     Tenderness: There is no abdominal tenderness. There is no right CVA tenderness or left CVA tenderness.  Genitourinary:    Comments: Chaperone present during exam.  No inguinal lymphadenopathy or inguinal hernia noted. Circumcised penis free of lesion or rash.  Normal scrotum with tenderness to palpation of left testicle with cystlike structure noted.  Normal scrotal skin no erythema edema or warmth. Musculoskeletal:     Cervical back: Neck supple.  Skin:    Findings: No rash.  Neurological:     Mental Status: He is alert.     ED Results / Procedures / Treatments   Labs (all labs  ordered are listed, but only abnormal results are displayed) Labs Reviewed  URINALYSIS, ROUTINE W REFLEX MICROSCOPIC    EKG None  Radiology US SCROTUM W/DOPPLER  Result Date: 06/11/2023 CLINICAL DATA:  Left testicular swelling associated with shooting pain down the left leg EXAM: SCROTAL ULTRASOUND DOPPLER ULTRASOUND OF THE TESTICLES TECHNIQUE: Complete ultrasound examination of the testicles, epididymis, and other scrotal structures was performed. Color and spectral Doppler ultrasound were also utilized to evaluate blood flow to the testicles. COMPARISON:  Scrotal ultrasound dated 03/31/2022 FINDINGS: Right testicle Measurements: 4.4 x 3.1 x 2.8 cm, 19.9 mL. No mass or microlithiasis visualized. Left testicle Measurements: 3.8 x 3.5 x 2.5 cm, 17.4 mL. No mass or microlithiasis visualized. Right epididymis:  Normal in size and appearance. Left epididymis:  Normal in size and appearance. Hydrocele:  Moderate left hydrocele. Varicocele:  None visualized. Pulsed Doppler interrogation of both testes demonstrates normal low resistance arterial and venous waveforms bilaterally. IMPRESSION: 1. Increased moderate left hydrocele. 2. No evidence of testicular torsion. Electronically Signed   By: Agustin Cree M.D.   On: 06/11/2023 15:17    Procedures Procedures    Medications Ordered in ED Medications  morphine (PF) 4 MG/ML injection 4 mg (4 mg Intravenous Given 06/11/23 1511)    ED Course/ Medical Decision Making/ A&P                                 Medical Decision Making Amount and/or Complexity of Data Reviewed Labs: ordered. Radiology: ordered.  Risk Prescription drug management.   BP (!) 133/101 (BP Location: Right Arm)   Pulse 61   Temp 98.3 F (36.8 C)   Resp 14   Ht 5\' 11"  (1.803 m)   Wt 97.5 kg   SpO2 99%   BMI 29.99 kg/m   3:35 PM This is a generally healthy 42 year old male presenting complaining of left testicle pain.  Patient developed pain to the left testicle that  started since yesterday and have progressed since.  Pain is sharp throbbing worse when he lays flat and improves when he walks around.  Pain also radiates down to his left thigh.  He denies any specific injury no fever no chills no penile discharge no hematuria no back pain no abdominal pain and no burning or urination.  Denies any new sexual partner and voiced no concern for STI.  No prior history of kidney stone.  States he had 1 similar episode like this in the past  On exam with chaperone present patient does have tenderness to the left testicle without any signs of cellulitis.  Testicle with normal lie no hernia noted no concerning rash.  Vital sign  overall reassuring.  -Labs ordered, independently viewed and interpreted by me.  Labs remarkable for UA without hematuria or signs of infection -The patient was maintained on a cardiac monitor.  I personally viewed and interpreted the cardiac monitored which showed an underlying rhythm of: NSR -Imaging independently viewed and interpreted by me and I agree with radiologist's interpretation.  Result remarkable for scrotal US showing increased moderate left hydrocele -This patient presents to the ED for concern of testicular pain, this involves an extensive number of treatment options, and is a complaint that carries with it a high risk of complications and morbidity.  The differential diagnosis includes testicular torsion, epididymitis, orchitis, cellulitis, abscess, hydrocele, kidney stone -Co morbidities that complicate the patient evaluation includes none -Treatment includes morphine -Reevaluation of the patient after these medicines showed that the patient improved -PCP office notes or outside notes reviewed  -Escalation to admission/observation considered: patients feels much better, is comfortable with discharge, and will follow up with urology -Prescription medication considered, patient comfortable with ibuprofen -Social Determinant of Health  considered which includes tobacco use and depression         Final Clinical Impression(s) / ED Diagnoses Final diagnoses:  Left hydrocele    Rx / DC Orders ED Discharge Orders          Ordered    ibuprofen (ADVIL) 600 MG tablet  Every 6 hours PRN        06/11/23 1545              Fayrene Helper, PA-C 06/11/23 1546    Rolan Bucco, MD 06/11/23 1710

## 2023-06-11 NOTE — ED Triage Notes (Signed)
Pt came in via POV d/t noticing his Lt testicle swollen last night & feeling shooting pains down the Lt leg. A.Ox4, rates his pain 9/10.

## 2023-06-12 ENCOUNTER — Ambulatory Visit (HOSPITAL_COMMUNITY): Payer: Medicaid Other

## 2023-12-12 ENCOUNTER — Emergency Department
Admission: EM | Admit: 2023-12-12 | Discharge: 2023-12-12 | Disposition: A | Discharge status: Home / Self Care | Payer: MEDICAID | Attending: Emergency Medicine | Admitting: Emergency Medicine

## 2023-12-12 ENCOUNTER — Other Ambulatory Visit: Payer: Self-pay

## 2023-12-12 DIAGNOSIS — F1010 Alcohol abuse, uncomplicated: Secondary | ICD-10-CM

## 2023-12-12 DIAGNOSIS — F10239 Alcohol dependence with withdrawal, unspecified: Secondary | ICD-10-CM | POA: Insufficient documentation

## 2023-12-12 DIAGNOSIS — F10930 Alcohol use, unspecified with withdrawal, uncomplicated: Secondary | ICD-10-CM

## 2023-12-12 DIAGNOSIS — F1410 Cocaine abuse, uncomplicated: Secondary | ICD-10-CM | POA: Insufficient documentation

## 2023-12-12 LAB — COMPREHENSIVE METABOLIC PANEL
ALT: 41 U/L (ref 0–44)
AST: 23 U/L (ref 15–41)
Albumin: 3.3 g/dL — ABNORMAL LOW (ref 3.5–5.0)
Alkaline Phosphatase: 47 U/L (ref 38–126)
Anion gap: 12 (ref 5–15)
BUN: 16 mg/dL (ref 6–20)
CO2: 20 mmol/L — ABNORMAL LOW (ref 22–32)
Calcium: 8.1 mg/dL — ABNORMAL LOW (ref 8.9–10.3)
Chloride: 109 mmol/L (ref 98–111)
Creatinine, Ser: 0.8 mg/dL (ref 0.61–1.24)
GFR, Estimated: >60 mL/min (ref >60)
Glucose, Bld: 111 mg/dL — ABNORMAL HIGH (ref 70–99)
Potassium: 3.1 mmol/L — ABNORMAL LOW (ref 3.5–5.1)
Sodium: 141 mmol/L (ref 135–145)
Total Bilirubin: 1 mg/dL (ref 0.0–1.2)
Total Protein: 5.5 g/dL — ABNORMAL LOW (ref 6.5–8.1)

## 2023-12-12 LAB — CBC
HCT: 45.3 % (ref 39.0–52.0)
Hemoglobin: 15.6 g/dL (ref 13.0–17.0)
MCH: 30.3 pg (ref 26.0–34.0)
MCHC: 34.4 g/dL (ref 30.0–36.0)
MCV: 88 fL (ref 80.0–100.0)
Platelets: 174 10*3/uL (ref 150–400)
RBC: 5.15 MIL/uL (ref 4.22–5.81)
RDW: 12.9 % (ref 11.5–15.5)
WBC: 10.1 10*3/uL (ref 4.0–10.5)
nRBC: 0 % (ref 0.0–0.2)

## 2023-12-12 LAB — URINE DRUG SCREEN, QUALITATIVE (ARMC ONLY)
Amphetamines, Ur Screen: NOT DETECTED
Barbiturates, Ur Screen: NOT DETECTED
Benzodiazepine, Ur Scrn: NOT DETECTED
Cannabinoid 50 Ng, Ur ~~LOC~~: POSITIVE — AB
Cocaine Metabolite,Ur ~~LOC~~: NOT DETECTED
MDMA (Ecstasy)Ur Screen: NOT DETECTED
Methadone Scn, Ur: NOT DETECTED
Opiate, Ur Screen: NOT DETECTED
Phencyclidine (PCP) Ur S: NOT DETECTED
Tricyclic, Ur Screen: NOT DETECTED

## 2023-12-12 LAB — ETHANOL: Alcohol, Ethyl (B): <10 mg/dL (ref <10)

## 2023-12-12 MED ORDER — CHLORDIAZEPOXIDE HCL 25 MG PO CAPS
50.0000 mg | ORAL_CAPSULE | Freq: Once | ORAL | Status: AC
  Administered 2023-12-12: 50 mg via ORAL
  Filled 2023-12-12: qty 2

## 2023-12-12 MED ORDER — CHLORDIAZEPOXIDE HCL 25 MG PO CAPS: ORAL_CAPSULE | ORAL | 0 refills | Status: AC

## 2023-12-12 NOTE — ED Provider Triage Note (Signed)
 Emergency Medicine Provider Triage Evaluation Note  [name] , a 43 y.o. male  was evaluated in triage.  Pt complains of being here for detox from alcohol and cocaine. Has completed rehab before. Normally drinks 6 40oz beers a day, no history of withdrawal seizures.  Review of Systems  Positive: headache Negative: pain  Physical Exam  BP (!) 146/92   Pulse 88   Temp 98.8 F (37.1 C)   Resp 17   Ht 5\' 10"  (1.778 m)   Wt 97.5 kg   SpO2 97%   BMI 30.85 kg/m  Gen:   Awake, no distress   Resp:  Normal effort  MSK:   Moves extremities without difficulty  Other:    Medical Decision Making  Medically screening exam initiated at 6:51 PM.  Appropriate orders placed.  [name] was informed that the remainder of the evaluation will be completed by another provider, this initial triage assessment does not replace that evaluation, and the importance of remaining in the ED until their evaluation is complete.     Phyliss Breen, PA-C 12/12/23 [phone]

## 2023-12-12 NOTE — ED Triage Notes (Signed)
 Pt presents requesting help detoxing from alcohol and cocaine. Pt normally drinks 6 40 ounce beers each day. Last drink was last night around 2130. Pt says he has done alcohol detox and rehab in the past, denies any withdrawal seizures. Pt reports hx of bipolar and schizophrenia and is compliant with medications.

## 2023-12-12 NOTE — ED Provider Notes (Signed)
 [facility] Provider Note   Event Date/Time   First MD Initiated Contact with Patient 12/12/23 2141     (approximate) History  Addiction Problem  HPI [name] is a 43 y.o. male with a stated past medical history of alcohol and cocaine abuse who presents requesting resources for alcohol and cocaine detoxification.  Patient states that he has been through inpatient and outpatient rehab before and requests being placed in inpatient rehab facility.  Patient states that last alcohol use was 1 night prior to arrival.  Patient currently endorses increasing anxiety and mild nausea. ROS: Patient currently denies any vision changes, tinnitus, difficulty speaking, facial droop, sore throat, chest pain, shortness of breath, abdominal pain, vomiting/diarrhea, dysuria, or weakness/numbness/paresthesias in any extremity   Physical Exam  Triage Vital Signs: ED Triage Vitals  Encounter Vitals Group     BP 12/12/23 1847 (!) 146/92     Systolic BP Percentile --      Diastolic BP Percentile --      Pulse Rate 12/12/23 1847 88     Resp 12/12/23 1847 17     Temp 12/12/23 1847 98.8 F (37.1 C)     Temp src --      SpO2 12/12/23 1847 97 %     Weight 12/12/23 1850 215 lb (97.5 kg)     Height 12/12/23 1850 5\' 10"  (1.778 m)     Head Circumference --      Peak Flow --      Pain Score 12/12/23 1850 0     Pain Loc --      Pain Education --      Exclude from Growth Chart --    Most recent vital signs: Vitals:   12/12/23 1847  BP: (!) 146/92  Pulse: 88  Resp: 17  Temp: 98.8 F (37.1 C)  SpO2: 97%   General: Awake, oriented x4. CV:  Good peripheral perfusion.  Resp:  Normal effort.  Abd:  No distention.  Other:  Middle-aged overweight Caucasian male resting comfortably in no acute distress ED Results / Procedures / Treatments  Labs (all labs ordered are listed, but only abnormal results are displayed) Labs Reviewed  COMPREHENSIVE METABOLIC PANEL - Abnormal; Notable  for the following components:      Result Value   Potassium 3.1 (*)    CO2 20 (*)    Glucose, Bld 111 (*)    Calcium  8.1 (*)    Total Protein 5.5 (*)    Albumin 3.3 (*)    All other components within normal limits  URINE DRUG SCREEN, QUALITATIVE ([facility] ONLY) - Abnormal; Notable for the following components:   Cannabinoid 50 Ng, Ur [place] POSITIVE (*)    All other components within normal limits  ETHANOL  CBC   PROCEDURES: Critical Care performed: No Procedures MEDICATIONS ORDERED IN ED: Medications  chlordiazePOXIDE  (LIBRIUM ) capsule 50 mg (has no administration in time range)   IMPRESSION / MDM / ASSESSMENT AND PLAN / ED COURSE  I reviewed the triage vital signs and the nursing notes.                             The patient is on the cardiac monitor to evaluate for evidence of arrhythmia and/or significant heart rate changes. Patient's presentation is most consistent with acute presentation with potential threat to life or bodily function. Patient is a 43 year old male who presents requesting resources for alcohol and cocaine  detoxification.  Patient shows no signs of active alcohol withdrawal at this time.  Patient is alert and oriented x 4.  Patient shows no signs of of tremors on exam.  Patient has mildly increased blood pressure at 146/92.  Patient shows no signs of active sympathomimetic intoxication at this time.  Patient given resources for inpatient and outpatient detoxification as well as a prescription for Librium  taper and instructions to follow-up with the listed resources as soon as possible for further care.  Dispo: Discharge home with follow-up in rehab facility   FINAL CLINICAL IMPRESSION(S) / ED DIAGNOSES   Final diagnoses:  Alcohol abuse  Cocaine abuse (HCC)  Alcohol withdrawal syndrome without complication (HCC)   Rx / DC Orders   ED Discharge Orders          Ordered    chlordiazePOXIDE  (LIBRIUM ) 25 MG capsule  Multiple Frequencies        12/12/23 2157            Note:  This document was prepared using Dragon voice recognition software and may include unintentional dictation errors.   Charleen Conn, MD 12/12/23 2206

## 2023-12-12 NOTE — BH Assessment (Addendum)
 This Clinical research associate provided supportive therapy and motivational interviewing to explore and enhance pt's motivation for behavioral change. Pt is in the preparation stage of change and expressed a desire for recovery. Pt reported that he'd been advised by [facility] staff to come to the ED to await a possible bed that may be available in the morning (12/13/23). Writer provided pt with substance abuse treatment/detox resources.  Pt requested that this writer contact his mother Edith Gores [phone]) to let her know that he is seeking help.

## 2024-03-09 ENCOUNTER — Ambulatory Visit (HOSPITAL_COMMUNITY)
Admission: EM | Admit: 2024-03-09 | Discharge: 2024-03-09 | Disposition: A | Discharge status: Home / Self Care | Payer: MEDICAID | Attending: Nurse Practitioner | Admitting: Nurse Practitioner

## 2024-03-09 ENCOUNTER — Encounter (HOSPITAL_COMMUNITY): Payer: Self-pay

## 2024-03-09 DIAGNOSIS — L0231 Cutaneous abscess of buttock: Secondary | ICD-10-CM | POA: Insufficient documentation

## 2024-03-09 MED ORDER — SULFAMETHOXAZOLE-TRIMETHOPRIM 800-160 MG PO TABS: 1.0000 | ORAL_TABLET | Freq: Two times a day (BID) | ORAL | 0 refills | Status: AC

## 2024-03-09 MED ORDER — LIDOCAINE HCL 2 % IJ SOLN
INTRAMUSCULAR | Status: AC
  Filled 2024-03-09: qty 20

## 2024-03-09 NOTE — ED Triage Notes (Signed)
 Patient presenting with boil on the bottom onset 2 days ago. States the area is growing and turning red.  Prescriptions or OTC medications tried: Yes- Epsom salt baths, warm compress   with no relief

## 2024-03-09 NOTE — ED Provider Notes (Signed)
 [facility]    CSN: [id number] Arrival date & time: 03/09/24  1144      History   Chief Complaint Chief Complaint  Patient presents with   Recurrent Skin Infections    HPI ANTHONNY Torres is a 43 y.o. male.   Subjective:   [name] is a 43 year old male who presents for evaluation of a probable cutaneous abscess located on the left upper buttock. The lesion began approximately two days ago and has progressively increased in size. The patient describes significant pain at the site, making it difficult for him to sit. He reports associated body aches but denies fevers or chills. He has attempted symptomatic relief with Epsom salt soaks and warm compresses, but notes minimal improvement. The patient reports a prior history of an abscess in the same general area, slightly higher, approximately 3 to 4 years ago, for which he underwent incision and drainage. He denies a history of diabetes or chronic kidney disease.   The following portions of the patient's history were reviewed and updated as appropriate: allergies, current medications, past family history, past medical history, past social history, past surgical history, and problem list.        Past Medical History:  Diagnosis Date   Bipolar 1 disorder (HCC)    Hypertension 08/08/2017    Patient Active Problem List   Diagnosis Date Noted   Substance induced mood disorder (HCC) 09/27/2021   Tobacco use disorder 08/08/2017   Hypertension 08/08/2017   Insomnia 08/08/2017   Viral syndrome 10/22/2013    History reviewed. No pertinent surgical history.     Home Medications    Prior to Admission medications   Medication Sig Start Date End Date Taking? Authorizing Provider  albuterol  (VENTOLIN  HFA) 108 (90 Base) MCG/ACT inhaler Inhale 2 puffs into the lungs every 4 (four) hours as needed for wheezing or shortness of breath. 09/28/21  Yes Rema Care, MD  atorvastatin  (LIPITOR) 20 MG tablet Take 1  tablet (20 mg total) by mouth daily. 09/01/22  Yes Marius Siemens, NP  gabapentin  (NEURONTIN ) 100 MG capsule Take 1 capsule (100 mg total) by mouth 3 (three) times daily. 09/28/21  Yes Rema Care, MD  hydrOXYzine  (ATARAX /VISTARIL ) 25 MG tablet Take 1 tablet (25 mg total) by mouth 3 (three) times daily as needed for anxiety. 09/28/21  Yes Rema Care, MD  ibuprofen  (ADVIL ) 600 MG tablet Take 1 tablet (600 mg total) by mouth every 6 (six) hours as needed. 06/11/23  Yes Debbra Fairy, PA-C  lisinopril  (ZESTRIL ) 10 MG tablet Take 1 tablet (10 mg total) by mouth daily. 09/03/22  Yes Marius Siemens, NP  propranolol  (INDERAL ) 10 MG tablet Take 1 tablet (10 mg total) by mouth 2 (two) times daily. 09/28/21  Yes Rema Care, MD  risperiDONE  (RISPERDAL ) 1 MG tablet Take 1 tablet (1 mg total) by mouth at bedtime. 09/28/21  Yes Rema Care, MD  sulfamethoxazole-trimethoprim (BACTRIM DS) 800-160 MG tablet Take 1 tablet by mouth 2 (two) times daily for 7 days. 03/09/24 03/16/24 Yes Evian Salguero, FNP  traZODone  (DESYREL ) 50 MG tablet Take 1 tablet (50 mg total) by mouth at bedtime as needed for sleep. 09/28/21  Yes Rema Care, MD    Family History Family History  Adopted: Yes  Family history unknown: Yes    Social History Social History   Tobacco Use   Smoking status: Every Day    Current packs/day: 0.50    Types: Cigarettes  Smokeless tobacco: Never   Tobacco comments:    down to 4 cigarettes a day   Vaping Use   Vaping status: Never Used  Substance Use Topics   Alcohol use: No   Drug use: Yes    Types: Marijuana    Comment: daily     Allergies   Patient has no known allergies.   Review of Systems Review of Systems  Constitutional:  Negative for chills and fever.  Gastrointestinal:  Negative for nausea and vomiting.  Musculoskeletal:  Positive for myalgias.  Skin:  Positive for wound.  All other systems reviewed and are  negative.    Physical Exam Triage Vital Signs ED Triage Vitals  Encounter Vitals Group     BP 03/09/24 1204 (!) 136/100     Systolic BP Percentile --      Diastolic BP Percentile --      Pulse Rate 03/09/24 1204 83     Resp 03/09/24 1204 18     Temp 03/09/24 1204 98.5 F (36.9 C)     Temp Source 03/09/24 1204 Oral     SpO2 03/09/24 1204 97 %     Weight --      Height --      Head Circumference --      Peak Flow --      Pain Score 03/09/24 1202 10     Pain Loc --      Pain Education --      Exclude from Growth Chart --    No data found.  Updated Vital Signs BP (!) 136/100 (BP Location: Right Arm)   Pulse 83   Temp 98.5 F (36.9 C) (Oral)   Resp 18   SpO2 97%   Visual Acuity Right Eye Distance:   Left Eye Distance:   Bilateral Distance:    Right Eye Near:   Left Eye Near:    Bilateral Near:     Physical Exam Vitals reviewed.  Constitutional:      General: He is awake. He is not in acute distress.    Appearance: Normal appearance. He is well-developed. He is not ill-appearing, toxic-appearing or diaphoretic.  HENT:     Head: Normocephalic.     Mouth/Throat:     Mouth: Mucous membranes are moist.  Eyes:     Conjunctiva/sclera: Conjunctivae normal.  Cardiovascular:     Rate and Rhythm: Normal rate and regular rhythm.     Heart sounds: Normal heart sounds.  Pulmonary:     Effort: Pulmonary effort is normal.     Breath sounds: Normal breath sounds.  Musculoskeletal:        General: Normal range of motion.  Skin:    General: Skin is warm and dry.     Findings: Abscess present.          Comments: erythematous, tender, and fluctuant area just lateral to the intergluteal cleft,  Neurological:     General: No focal deficit present.     Mental Status: He is alert and oriented to person, place, and time.  Psychiatric:        Behavior: Behavior is cooperative.      UC Treatments / Results  Labs (all labs ordered are listed, but only abnormal results  are displayed) Labs Reviewed  AEROBIC CULTURE W GRAM STAIN (SUPERFICIAL SPECIMEN)    EKG   Radiology No results found.  Procedures Incision and Drainage  Date/Time: 03/09/2024 4:48 PM  Performed by: Maryruth Sol, FNP Authorized by: Maryruth Sol, FNP  Consent:    Consent obtained:  Verbal   Consent given by:  Patient   Risks, benefits, and alternatives were discussed: yes     Risks discussed:  Bleeding, incomplete drainage, pain and infection Universal protocol:    Patient identity confirmed:  Verbally with patient and arm band Location:    Type:  Abscess   Location:  Anogenital   Anogenital location:  Gluteal cleft Pre-procedure details:    Skin preparation:  Povidone-iodine Anesthesia:    Anesthesia method:  Local infiltration   Local anesthetic:  Lidocaine 2% w/o epi Procedure type:    Complexity:  Complex Procedure details:    Wound management:  Probed and deloculated and irrigated with saline   Drainage:  Purulent   Drainage amount:  Moderate   Wound treatment:  Drain placed   Packing materials:  1/4 in iodoform gauze Post-procedure details:    Procedure completion:  Tolerated well, no immediate complications  (including critical care time)  Medications Ordered in UC Medications - No data to display  Initial Impression / Assessment and Plan / UC Course  I have reviewed the triage vital signs and the nursing notes.  Pertinent labs & imaging results that were available during my care of the patient were reviewed by me and considered in my medical decision making (see chart for details).    43 year old male presenting with a probable cutaneous abscess of the left upper buttock. Physical exam reveals an erythematous, tender, and fluctuant area just lateral to the intergluteal cleft, consistent with a localized abscess. The patient is afebrile and without signs of systemic illness. Given the size, location, and the patient's inability to sit due to pain,  an incision and drainage procedure was performed in the clinic without complication. Purulent material was expressed and a wound culture was obtained. The patient was prescribed Bactrim DS for empiric antibiotic coverage and Motrin  for pain management. Wound care instructions were provided, and the patient was advised to return in 48 hours for packing removal and reassessment. Indications for earlier follow-up, including signs of spreading infection or systemic symptoms, were thoroughly reviewed.  Today's evaluation has revealed no signs of a dangerous process. Discussed diagnosis with patient and/or guardian. Patient and/or guardian aware of their diagnosis, possible red flag symptoms to watch out for and need for close follow up. Patient and/or guardian understands verbal and written discharge instructions. Patient and/or guardian comfortable with plan and disposition.  Patient and/or guardian has a clear mental status at this time, good insight into illness (after discussion and teaching) and has clear judgment to make decisions regarding their care  Documentation was completed with the aid of voice recognition software. Transcription may contain typographical errors. Final Clinical Impressions(s) / UC Diagnoses   Final diagnoses:  Abscess of left buttock     Discharge Instructions      You were seen today for an absces.  An abscess is an infection under the skin that can happen anywhere on the body. If not treated, it can get worse and cause a more serious infection.  Today, we performed a procedure called an I&D, which stands for incision and drainage. This means a small cut was made to let the infection and pus drain out. The area was cleaned thoroughly, and a gauze packing was placed inside the open area to help it heal properly. This packing should stay in place until you return for your follow-up visit. A sample of the drainage was sent to the lab to check for  any bacteria.  You have been  prescribed antibiotics. Be sure to take them exactly as directed, and always take them with food to help avoid an upset stomach. Keep using warm, moist compresses on the area to help with healing.   Watch the area closely for any signs that the infection is getting worse, such as increased redness, swelling, pain, pus, or fever. If you notice any of these symptoms, contact your doctor, return to urgent care or go to the ED right away.    ED Prescriptions     Medication Sig Dispense Auth. Provider   sulfamethoxazole-trimethoprim (BACTRIM DS) 800-160 MG tablet Take 1 tablet by mouth 2 (two) times daily for 7 days. 14 tablet Maryruth Sol, FNP      PDMP not reviewed this encounter.   Maryruth Sol, [place] 03/09/24 1650

## 2024-03-09 NOTE — Discharge Instructions (Addendum)
 You were seen today for an absces.  An abscess is an infection under the skin that can happen anywhere on the body. If not treated, it can get worse and cause a more serious infection.  Today, we performed a procedure called an I&D, which stands for incision and drainage. This means a small cut was made to let the infection and pus drain out. The area was cleaned thoroughly, and a gauze packing was placed inside the open area to help it heal properly. This packing should stay in place until you return for your follow-up visit. A sample of the drainage was sent to the lab to check for any bacteria.  You have been prescribed antibiotics. Be sure to take them exactly as directed, and always take them with food to help avoid an upset stomach. Keep using warm, moist compresses on the area to help with healing.   Watch the area closely for any signs that the infection is getting worse, such as increased redness, swelling, pain, pus, or fever. If you notice any of these symptoms, contact your doctor, return to urgent care or go to the ED right away.

## 2024-03-12 LAB — AEROBIC CULTURE W GRAM STAIN (SUPERFICIAL SPECIMEN): Special Requests: NORMAL

## 2024-03-13 ENCOUNTER — Ambulatory Visit: Payer: Self-pay

## 2024-03-14 MED ORDER — AMOXICILLIN-POT CLAVULANATE 875-125 MG PO TABS: 1.0000 | ORAL_TABLET | Freq: Two times a day (BID) | ORAL | 0 refills | Status: AC

## 2024-07-31 ENCOUNTER — Telehealth (INDEPENDENT_AMBULATORY_CARE_PROVIDER_SITE_OTHER): Payer: Self-pay | Admitting: Primary Care

## 2024-07-31 NOTE — Telephone Encounter (Signed)
 Called pt to reschedule appt. Left msg with landlord to have pt call back so that we can reschedule.

## 2024-08-07 ENCOUNTER — Encounter (INDEPENDENT_AMBULATORY_CARE_PROVIDER_SITE_OTHER): Payer: MEDICAID | Admitting: Primary Care

## 2024-11-05 ENCOUNTER — Emergency Department (HOSPITAL_COMMUNITY): Payer: MEDICAID | Admitting: Certified Registered Nurse Anesthetist

## 2024-11-05 ENCOUNTER — Inpatient Hospital Stay (HOSPITAL_COMMUNITY)
Admission: EM | Admit: 2024-11-05 | Discharge: 2024-11-08 | DRG: 712 | Disposition: A | Discharge status: Home / Self Care | Payer: MEDICAID | Attending: Hospitalist | Admitting: Hospitalist

## 2024-11-05 ENCOUNTER — Other Ambulatory Visit: Payer: Self-pay

## 2024-11-05 ENCOUNTER — Emergency Department (HOSPITAL_COMMUNITY): Payer: MEDICAID

## 2024-11-05 ENCOUNTER — Encounter (HOSPITAL_COMMUNITY): Payer: Self-pay

## 2024-11-05 ENCOUNTER — Encounter (HOSPITAL_COMMUNITY)
Admission: EM | Disposition: A | Discharge status: Home / Self Care | Payer: Self-pay | Source: Home / Self Care | Attending: Hospitalist

## 2024-11-05 DIAGNOSIS — N4400 Torsion of testis, unspecified: Principal | ICD-10-CM | POA: Diagnosis present

## 2024-11-05 DIAGNOSIS — R918 Other nonspecific abnormal finding of lung field: Secondary | ICD-10-CM | POA: Diagnosis present

## 2024-11-05 DIAGNOSIS — N453 Epididymo-orchitis: Secondary | ICD-10-CM | POA: Diagnosis present

## 2024-11-05 DIAGNOSIS — R001 Bradycardia, unspecified: Secondary | ICD-10-CM | POA: Diagnosis present

## 2024-11-05 DIAGNOSIS — N50819 Testicular pain, unspecified: Secondary | ICD-10-CM | POA: Diagnosis not present

## 2024-11-05 DIAGNOSIS — F319 Bipolar disorder, unspecified: Secondary | ICD-10-CM | POA: Diagnosis present

## 2024-11-05 DIAGNOSIS — N50812 Left testicular pain: Secondary | ICD-10-CM | POA: Diagnosis not present

## 2024-11-05 DIAGNOSIS — N5089 Other specified disorders of the male genital organs: Secondary | ICD-10-CM | POA: Diagnosis present

## 2024-11-05 DIAGNOSIS — F17210 Nicotine dependence, cigarettes, uncomplicated: Secondary | ICD-10-CM | POA: Diagnosis present

## 2024-11-05 DIAGNOSIS — I10 Essential (primary) hypertension: Secondary | ICD-10-CM | POA: Diagnosis present

## 2024-11-05 DIAGNOSIS — N50811 Right testicular pain: Secondary | ICD-10-CM | POA: Diagnosis not present

## 2024-11-05 DIAGNOSIS — Z79899 Other long term (current) drug therapy: Secondary | ICD-10-CM

## 2024-11-05 LAB — COMPREHENSIVE METABOLIC PANEL WITH GFR
ALT: 37 U/L (ref 0–44)
AST: 25 U/L (ref 15–41)
Albumin: 4 g/dL (ref 3.5–5.0)
Alkaline Phosphatase: 63 U/L (ref 38–126)
Anion gap: 9 (ref 5–15)
BUN: 19 mg/dL (ref 6–20)
CO2: 27 mmol/L (ref 22–32)
Calcium: 9.2 mg/dL (ref 8.9–10.3)
Chloride: 106 mmol/L (ref 98–111)
Creatinine, Ser: 1.02 mg/dL (ref 0.61–1.24)
GFR, Estimated: >60 mL/min
Glucose, Bld: 117 mg/dL — ABNORMAL HIGH (ref 70–99)
Potassium: 4.2 mmol/L (ref 3.5–5.1)
Sodium: 142 mmol/L (ref 135–145)
Total Bilirubin: 0.3 mg/dL (ref 0.0–1.2)
Total Protein: 6.4 g/dL — ABNORMAL LOW (ref 6.5–8.1)

## 2024-11-05 LAB — CBC
HCT: 49 % (ref 39.0–52.0)
Hemoglobin: 16.2 g/dL (ref 13.0–17.0)
MCH: 30.7 pg (ref 26.0–34.0)
MCHC: 33.1 g/dL (ref 30.0–36.0)
MCV: 93 fL (ref 80.0–100.0)
Platelets: 141 K/uL — ABNORMAL LOW (ref 150–400)
RBC: 5.27 MIL/uL (ref 4.22–5.81)
RDW: 13.4 % (ref 11.5–15.5)
WBC: 13 K/uL — ABNORMAL HIGH (ref 4.0–10.5)
nRBC: 0 % (ref 0.0–0.2)

## 2024-11-05 LAB — LIPASE, BLOOD: Lipase: 32 U/L (ref 11–51)

## 2024-11-05 SURGERY — EXPLORATION, SCROTUM
Anesthesia: General | Laterality: Left

## 2024-11-05 MED ORDER — HYDROMORPHONE HCL 1 MG/ML IJ SOLN
1.0000 mg | INTRAMUSCULAR | Status: DC | PRN
  Administered 2024-11-05 – 2024-11-06 (×5): 1 mg via INTRAVENOUS
  Filled 2024-11-05 (×5): qty 1

## 2024-11-05 MED ORDER — ONDANSETRON HCL 4 MG/2ML IJ SOLN
4.0000 mg | Freq: Once | INTRAMUSCULAR | Status: AC
  Administered 2024-11-05: 4 mg via INTRAVENOUS
  Filled 2024-11-05: qty 2

## 2024-11-05 MED ORDER — FENTANYL CITRATE (PF) 100 MCG/2ML IJ SOLN
INTRAMUSCULAR | Status: AC
  Filled 2024-11-05: qty 2

## 2024-11-05 MED ORDER — METHOCARBAMOL 500 MG PO TABS
500.0000 mg | ORAL_TABLET | Freq: Three times a day (TID) | ORAL | Status: DC
  Administered 2024-11-05 – 2024-11-08 (×8): 500 mg via ORAL
  Filled 2024-11-05 (×8): qty 1

## 2024-11-05 MED ORDER — ACETAMINOPHEN 500 MG PO TABS
1000.0000 mg | ORAL_TABLET | Freq: Three times a day (TID) | ORAL | Status: DC
  Administered 2024-11-05 – 2024-11-08 (×8): 1000 mg via ORAL
  Filled 2024-11-05 (×8): qty 2

## 2024-11-05 MED ORDER — LISINOPRIL 10 MG PO TABS
10.0000 mg | ORAL_TABLET | Freq: Every day | ORAL | Status: DC
  Administered 2024-11-05 – 2024-11-08 (×3): 10 mg via ORAL
  Filled 2024-11-05 (×3): qty 1

## 2024-11-05 MED ORDER — HYDROMORPHONE HCL 1 MG/ML IJ SOLN: 1.0000 mg | Freq: Once | INTRAMUSCULAR | Status: DC

## 2024-11-05 MED ORDER — SODIUM CHLORIDE 0.9 % IV BOLUS
1000.0000 mL | Freq: Once | INTRAVENOUS | Status: AC
  Administered 2024-11-05: 1000 mL via INTRAVENOUS

## 2024-11-05 MED ORDER — ASPIRIN 325 MG PO TABS
325.0000 mg | ORAL_TABLET | Freq: Every day | ORAL | Status: DC
  Administered 2024-11-05 – 2024-11-06 (×2): 325 mg via ORAL
  Filled 2024-11-05 (×2): qty 1

## 2024-11-05 MED ORDER — GABAPENTIN 100 MG PO CAPS
100.0000 mg | ORAL_CAPSULE | Freq: Three times a day (TID) | ORAL | Status: DC
  Administered 2024-11-05 – 2024-11-08 (×8): 100 mg via ORAL
  Filled 2024-11-05 (×8): qty 1

## 2024-11-05 MED ORDER — HYDROMORPHONE HCL 1 MG/ML IJ SOLN
1.0000 mg | Freq: Once | INTRAMUSCULAR | Status: AC
  Administered 2024-11-05: 1 mg via INTRAVENOUS
  Filled 2024-11-05: qty 1

## 2024-11-05 MED ORDER — ENOXAPARIN SODIUM 40 MG/0.4ML IJ SOSY
40.0000 mg | PREFILLED_SYRINGE | INTRAMUSCULAR | Status: DC
  Administered 2024-11-06: 40 mg via SUBCUTANEOUS
  Filled 2024-11-05 (×3): qty 0.4

## 2024-11-05 MED ORDER — PROPRANOLOL HCL 10 MG PO TABS: 10.0000 mg | ORAL_TABLET | Freq: Two times a day (BID) | ORAL | Status: DC

## 2024-11-05 MED ORDER — LEVOFLOXACIN IN D5W 750 MG/150ML IV SOLN
750.0000 mg | INTRAVENOUS | Status: DC
  Administered 2024-11-05 – 2024-11-06 (×2): 750 mg via INTRAVENOUS
  Filled 2024-11-05 (×2): qty 150

## 2024-11-05 MED ORDER — IOHEXOL 350 MG/ML SOLN
75.0000 mL | Freq: Once | INTRAVENOUS | Status: AC | PRN
  Administered 2024-11-05: 75 mL via INTRAVENOUS

## 2024-11-05 MED ORDER — ATORVASTATIN CALCIUM 10 MG PO TABS
20.0000 mg | ORAL_TABLET | Freq: Every day | ORAL | Status: DC
  Administered 2024-11-05 – 2024-11-08 (×3): 20 mg via ORAL
  Filled 2024-11-05 (×3): qty 2

## 2024-11-05 MED ORDER — MIDAZOLAM HCL 2 MG/2ML IJ SOLN
INTRAMUSCULAR | Status: AC
  Filled 2024-11-05: qty 2

## 2024-11-05 MED ORDER — PROPOFOL 10 MG/ML IV BOLUS
INTRAVENOUS | Status: AC
  Filled 2024-11-05: qty 20

## 2024-11-05 MED ORDER — RISPERIDONE 0.25 MG PO TABS
1.0000 mg | ORAL_TABLET | Freq: Every day | ORAL | Status: DC
  Administered 2024-11-05 – 2024-11-07 (×3): 1 mg via ORAL
  Filled 2024-11-05: qty 4
  Filled 2024-11-05: qty 1
  Filled 2024-11-05 (×2): qty 4

## 2024-11-05 MED ORDER — TRAZODONE HCL 50 MG PO TABS
50.0000 mg | ORAL_TABLET | Freq: Every evening | ORAL | Status: DC | PRN
  Administered 2024-11-06: 50 mg via ORAL
  Filled 2024-11-05 (×2): qty 1

## 2024-11-05 NOTE — ED Notes (Signed)
 Patient transported to Ultrasound

## 2024-11-05 NOTE — ED Provider Notes (Signed)
 " [facility] Emergency Department Provider Note MRN:  [id number]  Arrival date & time: 11/05/2024     Chief Complaint   Testicle Pain   History of Present Illness   [name] is a 44 y.o. year-old male with a history of hypertension presenting to the ED with chief complaint of testicular pain.  Intermittent testicular pain on the left side as well as left abdominal pain for the past 2 days.  Pain became constant and severe 3 hours ago and not going away.  Review of Systems  A thorough review of systems was obtained and all systems are negative except as noted in the HPI and PMH.   Patient's Health History    Past Medical History:  Diagnosis Date   Bipolar 1 disorder (HCC)    Hypertension 08/08/2017    History reviewed. No pertinent surgical history.  Family History  Adopted: Yes  Family history unknown: Yes    Social History   Socioeconomic History   Marital status: Single    Spouse name: Not on file   Number of children: Not on file   Years of education: Not on file   Highest education level: Not on file  Occupational History   Not on file  Tobacco Use   Smoking status: Every Day    Current packs/day: 0.50    Types: Cigarettes   Smokeless tobacco: Never   Tobacco comments:    down to 4 cigarettes a day   Vaping Use   Vaping status: Never Used  Substance and Sexual Activity   Alcohol use: No   Drug use: Yes    Types: Marijuana    Comment: daily   Sexual activity: Yes    Birth control/protection: Condom  Other Topics Concern   Not on file  Social History Narrative   ** Merged History Encounter **       Social Drivers of Health   Tobacco Use: High Risk (11/05/2024)   Patient History    Smoking Tobacco Use: Every Day    Smokeless Tobacco Use: Never    Passive Exposure: Not on file  Financial Resource Strain: Not on file  Food Insecurity: Not on file  Transportation Needs: Not on file  Physical Activity: Not on file  Stress:  Not on file  Social Connections: Not on file  Intimate Partner Violence: Not on file  Depression (EYV7-0): Not on file  Alcohol Screen: Not on file  Housing: Not on file  Utilities: Not on file  Health Literacy: Not on file     Physical Exam   Vitals:   11/05/24 0356 11/05/24 0357  BP:  129/82  Pulse:  (!) 55  Resp:  20  Temp: 97.7 F (36.5 C)   SpO2:  100%    CONSTITUTIONAL: Well-appearing, moderate distress due to pain NEURO/PSYCH:  Alert and oriented x 3, no focal deficits EYES:  eyes equal and reactive ENT/NECK:  no LAD, no JVD CARDIO: Regular rate, well-perfused, normal S1 and S2 PULM:  CTAB no wheezing or rhonchi GI/GU: Diffuse abdominal tenderness MSK/SPINE:  No gross deformities, no edema SKIN:  no rash, atraumatic   *Additional and/or pertinent findings included in MDM below  Diagnostic and Interventional Summary    EKG Interpretation Date/Time:  Monday November 05 2024 04:28:37 EST Ventricular Rate:  53 PR Interval:  160 QRS Duration:  97 QT Interval:  446 QTC Calculation: 419 R Axis:   51  Text Interpretation: Sinus rhythm Abnormal R-wave progression, early transition  Confirmed by Theadore Sharper [phone]) on 11/05/2024 4:44:26 AM       Labs Reviewed  CBC - Abnormal; Notable for the following components:      Result Value   WBC 13.0 (*)    Platelets 141 (*)    All other components within normal limits  COMPREHENSIVE METABOLIC PANEL WITH GFR - Abnormal; Notable for the following components:   Glucose, Bld 117 (*)    Total Protein 6.4 (*)    All other components within normal limits  LIPASE, BLOOD    US  SCROTUM W/DOPPLER  Final Result    CT Angio Chest/Abd/Pel for Dissection W and/or Wo Contrast    (Results Pending)    Medications  HYDROmorphone  (DILAUDID ) injection 1 mg (1 mg Intravenous Given 11/05/24 0428)  ondansetron  (ZOFRAN ) injection 4 mg (4 mg Intravenous Given 11/05/24 0427)  sodium chloride  0.9 % bolus 1,000 mL (1,000 mLs Intravenous New  Bag/Given 11/05/24 0430)  iohexol  (OMNIPAQUE ) 350 MG/ML injection 75 mL (75 mLs Intravenous Contrast Given 11/05/24 0533)     Procedures  /  Critical Care .Critical Care  Performed by: Theadore Sharper HERO, MD Authorized by: Theadore Sharper HERO, MD   Critical care provider statement:    Critical care time (minutes):  35   Critical care was necessary to treat or prevent imminent or life-threatening deterioration of the following conditions: Acute testicular torsion.   Critical care was time spent personally by me on the following activities:  Development of treatment plan with patient or surrogate, discussions with consultants, evaluation of patient's response to treatment, examination of patient, ordering and review of laboratory studies, ordering and review of radiographic studies, ordering and performing treatments and interventions, pulse oximetry, re-evaluation of patient's condition and review of old charts   ED Course and Medical Decision Making  Initial Impression and Ddx Left testicle is swollen and tender, also having fair amount of abdominal pain and tenderness.  A bit of an odd presentation for a 44 year old to have acute testicular torsion but it is considered.  Other considerations would include loss of blood flow due to aortic pathology such as dissection.  Awaiting imaging.  Past medical/surgical history that increases complexity of ED encounter: Hypertension  Interpretation of Diagnostics I personally reviewed the EKG and my interpretation is as follows: Sinus rhythm  No significant blood count or electrolyte disturbance.  Ultrasound confirms testicular torsion  Patient Reassessment and Ultimate Disposition/Management     Case discussed with Dr. Shane of urology who will review the case and provide recommendations.  Awaiting CTA dissection study, on my read no obvious aortic pathology.  Anticipating admission, possibly or intervention.  Patient management required discussion  with the following services or consulting groups:  Urology  Complexity of Problems Addressed Acute illness or injury that poses threat of life of bodily function  Additional Data Reviewed and Analyzed Further history obtained from: Prior labs/imaging results  Additional Factors Impacting ED Encounter Risk Use of parenteral controlled substances and Consideration of hospitalization  Sharper HERO. Theadore, MD [facility] [email or web address]  Final Clinical Impressions(s) / ED Diagnoses     ICD-10-CM   1. Testicular torsion  N44.00       ED Discharge Orders     None        Discharge Instructions Discussed with and Provided to Patient:   Discharge Instructions   None      Theadore Sharper HERO, MD 11/05/24 [phone]  "

## 2024-11-05 NOTE — ED Notes (Signed)
Patient returned to his room.   

## 2024-11-05 NOTE — ED Provider Notes (Signed)
" °  Physical Exam  BP (!) 142/83   Pulse (!) 48   Temp 97.7 F (36.5 C)   Resp (!) 6   SpO2 100%   Physical Exam Vitals and nursing note reviewed.  HENT:     Head: Normocephalic and atraumatic.  Eyes:     Pupils: Pupils are equal, round, and reactive to light.  Cardiovascular:     Rate and Rhythm: Normal rate and regular rhythm.  Pulmonary:     Effort: Pulmonary effort is normal.     Breath sounds: Normal breath sounds.  Abdominal:     Palpations: Abdomen is soft.     Tenderness: There is no abdominal tenderness.  Skin:    General: Skin is warm and dry.  Neurological:     Mental Status: He is alert.  Psychiatric:        Mood and Affect: Mood normal.     Procedures  Procedures  ED Course / MDM   Clinical Course as of 11/05/24 0744  Mon Nov 05, 2024  [id number] Discussed with urology who is evaluated patient here in the ED.  Repeat ultrasound does show vascular flow not consistent with testicular torsion.  Perhaps some clotting.  Recommend admission to medicine team for continued observation.  Will start 325 aspirin  daily.  May require urology intervention if symptoms persist or worsen.  Paged hospitalist [MP]  0730 Discussed with Dr. Georgina who accepts patient for admission [MP]    Clinical Course User Index [MP] Pamella Ozell LABOR, DO   Medical Decision Making I, Ozell Pamella DO, have assumed care of this patient from the previous provider pending urology evaluation, repeat ultrasound and disposition  Amount and/or Complexity of Data Reviewed Labs: ordered. Radiology: ordered. ECG/medicine tests: ordered.  Risk Prescription drug management. Decision regarding hospitalization.          Pamella Ozell LABOR, DO 11/05/24 [phone]  "

## 2024-11-05 NOTE — ED Triage Notes (Signed)
 Patient is coming in with left testicular pain that radiates to his left abdomen that has been going on for 2 days now. This bout of pain started approx 3 hours ago. Patient denies to possibility of STI. Denies any changes to urination and bowel movements. EMS VS 182/100 BP 60s HR is on a beta blocker 100% RA 129 CBG

## 2024-11-05 NOTE — ED Triage Notes (Signed)
 Left testicular pain rads to left abd X2 days 10/10 pain  Started 3 hours No abdonormal bowle 182/100 60s hr 100ra 129cbg Patient is coming in with left testicular pain that radiates to his left abdomen that has been going on for 2 days now. This bout of pain started approx 3 hours ago. Patient denies to possibility of STI. Denies any changes to urination and bowel movements. EMS VS 182/100 BP 60s HR is on a beta blocker 100% RA 129 CBG

## 2024-11-05 NOTE — ED Notes (Signed)
 Patient transported to CT

## 2024-11-05 NOTE — ED Notes (Signed)
 Report given Amanda CRNA from the OR for patient.

## 2024-11-05 NOTE — H&P (Addendum)
 " History and Physical    Patient: [name] FMW:[id number] DOB: 03-13-1981 DOA: 11/05/2024 DOS: the patient was seen and examined on 11/05/2024 PCP: Celestia Rosaline SQUIBB, NP  Patient coming from: Home  Chief Complaint:  Chief Complaint  Patient presents with   Testicle Pain   HPI: [name] is a 44 y.o. male with medical history significant of bipolar 1 disorder and HTN p/w L testicular pain.   The patient presented with severe left testicular pain that began around 2:15 in the morning. The patient reported that the pain shot down the left side and into the abdomen. The patient could not manage the pain and called an ambulance for assistance. Upon evaluation, initial imaging suggested possible testicular torsion; however, subsequent imaging ruled out torsion. There was concern for possible old blood clots obstructing blood flow, leading to the current symptoms. The patient was started on aspirin  to help break down any clots. The patient was receiving medications to control pain, including Tylenol , gabapentin  (with potential dose increase), muscle relaxers, and IV pain medication as needed.  In the ED, pt bradycardic. Labs notable for WBC 13. Initial scrotal US  showed findings most consistent with acute left testicular torsion with associated left epididymal ischemia/infarction (no arterial or venous flow in the left testis or epididymis, mildly enlarged/echogenic). Repeat scrotal US  showed the left testicle is hypovascular compared to the contralateral testicle but flow signal is identified in the parenchyma of the left testicle on color Doppler imaging. EDP consulted Urology who requested medicine admission.   Review of Systems: As mentioned in the history of present illness. All other systems reviewed and are negative. Past Medical History:  Diagnosis Date   Bipolar 1 disorder (HCC)    Hypertension 08/08/2017   History reviewed. No pertinent surgical history. Social History:  reports  that he has been smoking cigarettes. He has never used smokeless tobacco. He reports current drug use. Drug: Marijuana. He reports that he does not drink alcohol.  Allergies[1]  Family History  Adopted: Yes  Family history unknown: Yes    Prior to Admission medications  Medication Sig Start Date End Date Taking? Authorizing Provider  albuterol  (VENTOLIN  HFA) 108 (90 Base) MCG/ACT inhaler Inhale 2 puffs into the lungs every 4 (four) hours as needed for wheezing or shortness of breath. 09/28/21  Yes Hansel Comer RAMAN, MD  atorvastatin  (LIPITOR) 20 MG tablet Take 1 tablet (20 mg total) by mouth daily. 09/01/22  Yes Celestia Rosaline SQUIBB, NP  gabapentin  (NEURONTIN ) 100 MG capsule Take 1 capsule (100 mg total) by mouth 3 (three) times daily. 09/28/21  Yes Hansel Comer RAMAN, MD  hydrOXYzine  (ATARAX /VISTARIL ) 25 MG tablet Take 1 tablet (25 mg total) by mouth 3 (three) times daily as needed for anxiety. 09/28/21  Yes Hansel Comer RAMAN, MD  lisinopril  (ZESTRIL ) 10 MG tablet Take 1 tablet (10 mg total) by mouth daily. 09/03/22  Yes Celestia Rosaline SQUIBB, NP  propranolol  (INDERAL ) 10 MG tablet Take 1 tablet (10 mg total) by mouth 2 (two) times daily. 09/28/21  Yes Hansel Comer RAMAN, MD  risperiDONE  (RISPERDAL ) 1 MG tablet Take 1 tablet (1 mg total) by mouth at bedtime. 09/28/21  Yes Hansel Comer RAMAN, MD  traZODone  (DESYREL ) 50 MG tablet Take 1 tablet (50 mg total) by mouth at bedtime as needed for sleep. 09/28/21  Yes Hansel Comer RAMAN, MD    Physical Exam: Vitals:   11/05/24 0357 11/05/24 0430 11/05/24 0530 11/05/24 0828  BP: 129/82 (!) 142/95 (!) 142/83  Pulse: (!) 55 (!) 49 (!) 48   Resp: 20 13 (!) 6   Temp:    98.6 F (37 C)  TempSrc:    Oral  SpO2: 100% 100% 100%    General: Alert, oriented x3, resting comfortably in no acute distress Respiratory: Lungs clear to auscultation bilaterally with normal respiratory effort; no w/r/r Cardiovascular: Regular rate and rhythm w/o  m/r/g Abdomen: Soft, nontender, nondistended. Positive bowel sounds   Data Reviewed:  Lab Results  Component Value Date   WBC 13.0 (H) 11/05/2024   HGB 16.2 11/05/2024   HCT 49.0 11/05/2024   MCV 93.0 11/05/2024   PLT 141 (L) 11/05/2024   Lab Results  Component Value Date   GLUCOSE 117 (H) 11/05/2024   CALCIUM  9.2 11/05/2024   NA 142 11/05/2024   K 4.2 11/05/2024   CO2 27 11/05/2024   CL 106 11/05/2024   BUN 19 11/05/2024   CREATININE 1.02 11/05/2024   Lab Results  Component Value Date   ALT 37 11/05/2024   AST 25 11/05/2024   ALKPHOS 63 11/05/2024   BILITOT 0.3 11/05/2024   No results found for: INR, PROTIME Radiology: US  SCROTUM W/DOPPLER Result Date: 11/05/2024 CLINICAL DATA:  Left testicular pain. EXAM: SCROTAL ULTRASOUND DOPPLER ULTRASOUND OF THE TESTICLES TECHNIQUE: Complete ultrasound examination of the testicles, epididymis, and other scrotal structures was performed. Color and spectral Doppler ultrasound were also utilized to evaluate blood flow to the testicles. COMPARISON:  Ultrasound exam from 2 hours prior. FINDINGS: Right testicle Measurements: 5.0 x 3.1 x 3.1 cm. No mass or microlithiasis visualized. Physiologic volume fluid in the right hemiscrotum. Doppler: There is normal vascularity on color doppler examination. Spectral doppler arterial and venous waveforms are normal. Left testicle Measurements:  4.5 x 4.0 x 3.8 cm. No mass or microlithiasis visualized. Doppler: As on the previous study, the left testicle appears hypovascular compared to the contralateral testicle. However on the current study there is linear color signal on Doppler imaging with spectral waveform suggesting venous etiology. Spectral imaging also suggests that there is attenuated arterial flow to the parenchyma of the left testicle. No intratesticular mass lesion evident. Small simple hydrocele associated. Right epididymis:  Normal in size and appearance. Left epididymis:  Normal in size and  appearance. Hydrocele:  As above. Varicocele:  None visualized. IMPRESSION: 1. On the current study, the left testicle is hypovascular compared to the contralateral testicle but flow signal is identified in the parenchyma of the left testicle on color Doppler imaging. Although modest, this flow signal does appear new compared to the ultrasound performed 2 hours prior. Spectral imaging suggests venous flow, potentially with attenuated arterial flow to the parenchyma of the left testicle. Given the presence of detectable flow to the left testicle, this would not be entirely consistent with testicular avascularity due to torsion. However findings are compatible with a degree or a component of vascular compromise to the left testicle or another etiology resulting in hypovascularity of the left testicular parenchyma. 2. Normal sonographic appearance of the right testicle. Findings were discussed with Dr. Shane at the time of study interpretation at approximately 0700 hours on 11/05/2024. Electronically Signed   By: Camellia Candle M.D.   On: 11/05/2024 07:23   CT Angio Chest/Abd/Pel for Dissection W and/or Wo Contrast Result Date: 11/05/2024 EXAM: CTA CHEST, ABDOMEN AND PELVIS WITHOUT AND WITH CONTRAST 11/05/2024 05:32:44 AM TECHNIQUE: CTA of the chest was performed without and with the administration of intravenous contrast. CTA of the abdomen and pelvis was  performed without and with the administration of intravenous contrast. 75 mL of iohexol  (OMNIPAQUE ) 350 MG/ML injection was administered. Multiplanar reformatted images are provided for review. MIP images are provided for review. Automated exposure control, iterative reconstruction, and/or weight based adjustment of the mA/kV was utilized to reduce the radiation dose to as low as reasonably achievable. COMPARISON: None available. CLINICAL HISTORY: Acute aortic syndrome (AAS) suspected. FINDINGS: VASCULATURE: AORTA: No acute finding. No abdominal aortic aneurysm.  No dissection. PULMONARY ARTERIES: No pulmonary embolism with the limits of this exam. GREAT VESSELS OF AORTIC ARCH: No acute finding. No dissection. No arterial occlusion or significant stenosis. CELIAC TRUNK: No acute finding. No occlusion or significant stenosis. SUPERIOR MESENTERIC ARTERY: No acute finding. No occlusion or significant stenosis. INFERIOR MESENTERIC ARTERY: No acute finding. No occlusion or significant stenosis. RENAL ARTERIES: No acute finding. No occlusion or significant stenosis. ILIAC ARTERIES: No acute finding. No occlusion or significant stenosis. CHEST: MEDIASTINUM: No mediastinal lymphadenopathy. The heart and pericardium demonstrate no acute abnormality. LUNGS AND PLEURA: There is a 2 mm irregular nodular opacity present in the lateral periphery of the left upper lobe on image 61 of series 6. A focal ground-glass opacity is present anteriorly within the left upper lobe on image 92. A 2 mm nodular opacity is present within the left lower lobe on image 132. There is minimal plate-like atelectasis in the dependent aspect of the right lower lobe. No evidence of pleural effusion or pneumothorax. THORACIC BONES AND SOFT TISSUES: No acute bone or soft tissue abnormality. ABDOMEN AND PELVIS: LIVER: The liver is unremarkable. GALLBLADDER AND BILE DUCTS: Gallbladder is unremarkable. No biliary ductal dilatation. SPLEEN: The spleen is unremarkable. PANCREAS: The pancreas is unremarkable. ADRENAL GLANDS: Bilateral adrenal glands demonstrate no acute abnormality. KIDNEYS, URETERS AND BLADDER: No stones in the kidneys or ureters. No hydronephrosis. No perinephric or periureteral stranding. Urinary bladder is unremarkable. GI AND BOWEL: Stomach and duodenal sweep demonstrate no acute abnormality. There is no bowel obstruction. No abnormal bowel wall thickening or distension. REPRODUCTIVE: Reproductive organs are unremarkable. PERITONEUM AND RETROPERITONEUM: No ascites or free air. LYMPH NODES: No  lymphadenopathy. ABDOMINAL BONES AND SOFT TISSUES: No acute abnormality of the bones. No acute soft tissue abnormality. IMPRESSION: 1. No evidence of acute aortic syndrome. 2. 2 mm irregular left upper lobe nodule, 2 mm left lower lobe nodule, and focal left upper lobe ground-glass opacity. As per Fleischner Society Guidelines, no routine follow-up is recommended for multiple solid nodules <6 mm if low risk; if high risk, optional noncontrast chest CT at 12 months. Minimal dependent right lower lobe atelectasis. Electronically signed by: Evalene Coho MD 11/05/2024 05:57 AM EST RP Workstation: [id number]   US  SCROTUM W/DOPPLER Result Date: 11/05/2024 EXAM: ULTRASOUND SCROTUM/TESTICLES WITH DOPPLER FLOW EVALUATION 11/05/2024 05:11:25 AM TECHNIQUE: Duplex ultrasound using B-mode/gray scaled imaging, Doppler spectral analysis and color flow Doppler was obtained of the testicles. COMPARISON: None available. CLINICAL HISTORY: testicular pain FINDINGS: RIGHT: GREY SCALE: The right testicle measures 4.6 x 2.9 x 2.9 cm. It demonstrates normal homogeneous echotexture without focal lesion. No testicular microlithiasis. DOPPLER EVALUATION: There is normal arterial and venous Doppler flow within the testicle. VARICOCELE: No scrotal varicocele. SCROTAL SAC: No hydrocele. EPIDIDYMIS: The right epididymis is unremarkable. LEFT: GREY SCALE: The left testicle is mildly enlarged and echogenic relative to the right. It measures 4.8 x 3.4 x 3.9 cm. No testicular microlithiasis. DOPPLER EVALUATION: The left testicle demonstrates no arterial or venous blood flow. The most likely differential diagnosis based on these findings is  left testicular torsion. Other considerations include epididymo-orchitis with severe ischemia or infarction. Immediate urological consultation is indicated for suspected testicular torsion. VARICOCELE: No scrotal varicocele. SCROTAL SAC: There is a left hydrocele. EPIDIDYMIS: There is no blood flow within the  left epididymis demonstrated. IMPRESSION: 1. Findings most consistent with acute left testicular torsion with associated left epididymal ischemia/infarction (no arterial or venous flow in the left testis or epididymis, mildly enlarged/echogenic). Most likely differential considerations include global testicular infarction from a non-torsion etiology (e.g., severe epididymo-orchitis with vascular compromise) and less likely acute traumatic/vascular injury. Emergent urologic consultation is recommended. Findings were discussed with Dr. Theadore at 5:23 am 11/05/24. 2. Left hydrocele. Electronically signed by: Evalene Coho MD 11/05/2024 05:24 AM EST RP Workstation: [id number]    Assessment and Plan: 57M h/o bipolar 1 disorder and HTN p/w L testicular pain.   L testicular pain Initial imaging c/f torsion, but repeat imaging demonstrated flow with hypovascularit -Urology consulted; apprec eval/recs (IV abx, ASA 325mg , scrotal support, and NPO at MN for possible procedure) -IV ciprofloxacin  750mg  daily for now -Multimodal pain control w/ tylenol  1g TID, robaxin  500mg  TID, pta gabapentin  100mg  TID, and dilaudid  1mg  q4h prn -F/u GC/Chlamydia urine to eval for STI  Bradycardia -Tele -HOLD pta propanolol 10mg  BID for now   HTN -PTA lisinopril  10mg  daily  Bipolar disorder type 1 -HOLD pta propanolol 10mg  BID for now given bradycardia -PTA risperdone 1mg  nightly and trazodone  50mg  nightly prn  Pulmonary nodules 2 mm irregular left upper lobe nodule, 2 mm left lower lobe nodule, and focal left upper lobe ground-glass opacity on CT chest -As per Fleischner Society Guidelines, no routine follow-up is recommended for multiple solid nodules <6 mm if low risk; if high risk, optional noncontrast chest CT at 12 months.    Advance Care Planning:   Code Status: Full Code   Consults: Urology  Family Communication: Mother  Severity of Illness: The appropriate patient status for this patient is  OBSERVATION. Observation status is judged to be reasonable and necessary in order to provide the required intensity of service to ensure the patient's safety. The patient's presenting symptoms, physical exam findings, and initial radiographic and laboratory data in the context of their medical condition is felt to place them at decreased risk for further clinical deterioration. Furthermore, it is anticipated that the patient will be medically stable for discharge from the hospital within 2 midnights of admission.    ------- I spent 56 minutes reviewing previous notes, at the bedside counseling/discussing the treatment plan, and performing clinical documentation.  Author: Marsha Ada, MD 11/05/2024 9:02 AM  For on call review [email or web address].      [1] No Known Allergies  "

## 2024-11-05 NOTE — Consult Note (Addendum)
 I have been asked to see the patient by Dr. Ozell Pope, for evaluation and management of left testicular torsion.  History of present illness: 44 year old male presented to the ER after several days of left testicular pain.  Patient states that the pain came on very slowly and has steadily worsened.  He has had nausea and vomiting no fevers and chills.  Patient's past medical history includes bipolar 1 hypertension  Patient has been to the ER twice before this for left testicular pain both previous ultrasounds showed significant only better flow than the ultrasound tonight.  Ultrasound is consistent with no blood flow to the left testicle.  Exam demonstrates an enlarged left testicle with no skin erythema or concerns for cellulitis or infection.  Patient's white count is only slightly elevated at 13.2    Review of systems: A 12 point comprehensive review of systems was obtained and is negative unless otherwise stated in the history of present illness.  Patient Active Problem List   Diagnosis Date Noted   Substance induced mood disorder (HCC) 09/27/2021   Tobacco use disorder 08/08/2017   Hypertension 08/08/2017   Insomnia 08/08/2017   Viral syndrome 10/22/2013    Medications Ordered Prior to Encounter[1]  Past Medical History:  Diagnosis Date   Bipolar 1 disorder (HCC)    Hypertension 08/08/2017    History reviewed. No pertinent surgical history.  Social History[2]  Family History  Adopted: Yes  Family history unknown: Yes    PE: Vitals:   11/05/24 0356 11/05/24 0357 11/05/24 0430 11/05/24 0530  BP:  129/82 (!) 142/95 (!) 142/83  Pulse:  (!) 55 (!) 49 (!) 48  Resp:  20 13 (!) 6  Temp: 97.7 F (36.5 C)     SpO2:  100% 100% 100%   Patient appears to be in no acute distress  Respiratory: Normal breathing room air Cardiovascular: Regular rate and rhythm per monitor Abdomen: Soft non tender GU: No catheter in place, circumciised penis no irregularities. Scrotal  exam: R testicle normal size no masses no tenderness, L testicle enlarged very tender, no skin changes, no signs of cellulitis, tense engorged left testicle.   Recent Labs    11/05/24 0416  WBC 13.0*  HGB 16.2  HCT 49.0   Recent Labs    11/05/24 0416  NA 142  K 4.2  CL 106  CO2 27  GLUCOSE 117*  BUN 19  CREATININE 1.02  CALCIUM  9.2   No results for input(s): LABPT, INR in the last 72 hours. No results for input(s): LABURIN in the last 72 hours. Results for orders placed or performed during the hospital encounter of 03/09/24  Aerobic Culture w Gram Stain (superficial specimen)     Status: None   Collection Time: 03/09/24  2:25 PM   Specimen: Buttocks; Wound  Result Value Ref Range Status   Specimen Description BUTTOCKS  Final   Special Requests Normal  Final   Gram Stain   Final    FEW WBC PRESENT, PREDOMINANTLY PMN MODERATE GRAM POSITIVE COCCI IN PAIRS RARE GRAM NEGATIVE RODS    Culture   Final    ABUNDANT ACTINOMYCES SPECIES ACTINOMYCES TURICENSIS Standardized susceptibility testing for this organism is not available. Performed at [facility], [address], [place], [place] [address]    Report Status 03/12/2024 FINAL  Final    Imaging:  Scrotal US  imaging   IMPRESSION: 1. Findings most consistent with acute left testicular torsion with associated left epididymal ischemia/infarction (no arterial or venous  flow in the left testis or epididymis, mildly enlarged/echogenic). Most likely differential considerations include global testicular infarction from a non-torsion etiology (e.g., severe epididymo-orchitis with vascular compromise) and less likely acute traumatic/vascular injury. Emergent urologic consultation is recommended. Findings were discussed with Dr. Theadore at 5:23 am 11/05/24. 2. Left hydrocele.  Imp: 44 year old male presented to the ER with left testicular pain.  Urology was consulted for concerns for left testicular torsion.  Patient's  ultrasound and exam are consistent with a torsion but his story is not as the pain came on slowly.  At this point I have concern for possible torsion, orchitis, and some sort of vascular occlusion of the testicular vasculature.  At this point due to torsion being an acute instance and patient in significant pain he only way to rule this out completely is by doing surgery to evaluate for torsion.  We also did attempt to repeat the scrotal ultrasound prior to the procedure.  I explained to the patient that if the pain started 2 days ago and this is in fact a torsion there is high likelyhood that his testicle has died and will be required to be removed.    We discussed risk benefits alternatives to the procedure including bleeding infection demonstrating structures possible injury to the testicles possibly having to remove the left testicle requiring the perform an orchiopexy on the right testicle.  We also discussed the chance that if we did the scrotal exploration and there is no torsion at all.  Patient voiced understanding consent was obtained.  [id number] [id number]  I personally spoke to the radiologist who read the US  at 0701 Repeat Ultrasound showed blood flow to the left testicle though decreased this would not indicate torsion. This is more consistent with his story of slow onset of pain  Differential now orchitis, testicle venous occlusion 2/2 clot, low likelyhood of torsion.   I posed options to the patient including observation while starting abx, ASA 325, and pain meds vs going to the OR now. The risks were explained to him that if the pain worsens we can always removed the testicle. These options were explained at length to the patient.  The patient would like to observe for now with the above plan to see if pain improves. Will admit and see how patient is doing over the course of a day or 2   Recommendations: - ok to eat no OR today  - recommend medicine admit for observation  - make NPO at  midnight just incase  - start abx for epididymoorchitis CTX is fine  - order urine culture - start 325mg  ASA - scrotal support  - pain medicine    Thank you for involving me in this patient's care, I will continue to follow along.Please page with any further questions or concerns. Serra Younan C Daqwan Dougal       [1]  No current facility-administered medications on file prior to encounter.   Current Outpatient Medications on File Prior to Encounter  Medication Sig Dispense Refill   atorvastatin  (LIPITOR) 20 MG tablet Take 1 tablet (20 mg total) by mouth daily. 90 tablet 1   gabapentin  (NEURONTIN ) 100 MG capsule Take 1 capsule (100 mg total) by mouth 3 (three) times daily. 90 capsule 1   hydrOXYzine  (ATARAX /VISTARIL ) 25 MG tablet Take 1 tablet (25 mg total) by mouth 3 (three) times daily as needed for anxiety. 30 tablet 1   lisinopril  (ZESTRIL ) 10 MG tablet Take 1 tablet (10 mg total) by mouth daily. 90 tablet  1   propranolol  (INDERAL ) 10 MG tablet Take 1 tablet (10 mg total) by mouth 2 (two) times daily. 60 tablet 1   risperiDONE  (RISPERDAL ) 1 MG tablet Take 1 tablet (1 mg total) by mouth at bedtime. 30 tablet 1   traZODone  (DESYREL ) 50 MG tablet Take 1 tablet (50 mg total) by mouth at bedtime as needed for sleep. 30 tablet 1   albuterol  (VENTOLIN  HFA) 108 (90 Base) MCG/ACT inhaler Inhale 2 puffs into the lungs every 4 (four) hours as needed for wheezing or shortness of breath. 8 g 1  [2]  Social History Tobacco Use   Smoking status: Every Day    Current packs/day: 0.50    Types: Cigarettes   Smokeless tobacco: Never   Tobacco comments:    down to 4 cigarettes a day   Vaping Use   Vaping status: Never Used  Substance Use Topics   Alcohol use: No   Drug use: Yes    Types: Marijuana    Comment: daily

## 2024-11-05 NOTE — Plan of Care (Signed)
   Problem: Education: Goal: Knowledge of General Education information will improve Description: Including pain rating scale, medication(s)/side effects and non-pharmacologic comfort measures Outcome: Progressing   Problem: Pain Managment: Goal: General experience of comfort will improve and/or be controlled Outcome: Progressing   Problem: Safety: Goal: Ability to remain free from injury will improve Outcome: Progressing

## 2024-11-06 DIAGNOSIS — N50811 Right testicular pain: Secondary | ICD-10-CM

## 2024-11-06 DIAGNOSIS — N50819 Testicular pain, unspecified: Secondary | ICD-10-CM | POA: Diagnosis not present

## 2024-11-06 LAB — BASIC METABOLIC PANEL WITH GFR
Anion gap: 7 (ref 5–15)
BUN: 12 mg/dL (ref 6–20)
CO2: 26 mmol/L (ref 22–32)
Calcium: 8.7 mg/dL — ABNORMAL LOW (ref 8.9–10.3)
Chloride: 105 mmol/L (ref 98–111)
Creatinine, Ser: 0.93 mg/dL (ref 0.61–1.24)
GFR, Estimated: >60 mL/min
Glucose, Bld: 89 mg/dL (ref 70–99)
Potassium: 4.1 mmol/L (ref 3.5–5.1)
Sodium: 138 mmol/L (ref 135–145)

## 2024-11-06 LAB — CBC
HCT: 45.9 % (ref 39.0–52.0)
Hemoglobin: 15.2 g/dL (ref 13.0–17.0)
MCH: 30.3 pg (ref 26.0–34.0)
MCHC: 33.1 g/dL (ref 30.0–36.0)
MCV: 91.4 fL (ref 80.0–100.0)
Platelets: 131 K/uL — ABNORMAL LOW (ref 150–400)
RBC: 5.02 MIL/uL (ref 4.22–5.81)
RDW: 13.3 % (ref 11.5–15.5)
WBC: 10.8 K/uL — ABNORMAL HIGH (ref 4.0–10.5)
nRBC: 0 % (ref 0.0–0.2)

## 2024-11-06 LAB — MRSA NEXT GEN BY PCR, NASAL: MRSA by PCR Next Gen: NOT DETECTED

## 2024-11-06 LAB — HIV ANTIBODY (ROUTINE TESTING W REFLEX): HIV Screen 4th Generation wRfx: NONREACTIVE

## 2024-11-06 MED ORDER — GUAIFENESIN 100 MG/5ML PO LIQD: 5.0000 mL | ORAL | Status: DC | PRN

## 2024-11-06 MED ORDER — HYDRALAZINE HCL 20 MG/ML IJ SOLN: 10.0000 mg | INTRAMUSCULAR | Status: DC | PRN

## 2024-11-06 MED ORDER — IPRATROPIUM-ALBUTEROL 0.5-2.5 (3) MG/3ML IN SOLN: 3.0000 mL | RESPIRATORY_TRACT | Status: DC | PRN

## 2024-11-06 MED ORDER — HYDROMORPHONE HCL 1 MG/ML IJ SOLN
1.0000 mg | INTRAMUSCULAR | Status: DC | PRN
  Administered 2024-11-07: 1 mg via INTRAVENOUS
  Filled 2024-11-06: qty 1

## 2024-11-06 MED ORDER — OXYCODONE HCL 5 MG PO TABS
5.0000 mg | ORAL_TABLET | ORAL | Status: DC | PRN
  Administered 2024-11-06 (×2): 5 mg via ORAL
  Filled 2024-11-06 (×2): qty 1

## 2024-11-06 MED ORDER — METOPROLOL TARTRATE 5 MG/5ML IV SOLN: 5.0000 mg | INTRAVENOUS | Status: DC | PRN

## 2024-11-06 MED ORDER — ONDANSETRON HCL 4 MG/2ML IJ SOLN: 4.0000 mg | Freq: Four times a day (QID) | INTRAMUSCULAR | Status: DC | PRN

## 2024-11-06 MED ORDER — GLUCAGON HCL RDNA (DIAGNOSTIC) 1 MG IJ SOLR: 1.0000 mg | INTRAMUSCULAR | Status: DC | PRN

## 2024-11-06 MED ORDER — SENNOSIDES-DOCUSATE SODIUM 8.6-50 MG PO TABS: 1.0000 | ORAL_TABLET | Freq: Every evening | ORAL | Status: DC | PRN

## 2024-11-06 MED ORDER — LEVOFLOXACIN 750 MG PO TABS
750.0000 mg | ORAL_TABLET | Freq: Every day | ORAL | Status: DC
  Administered 2024-11-08: 750 mg via ORAL
  Filled 2024-11-06 (×2): qty 1

## 2024-11-06 NOTE — Plan of Care (Signed)

## 2024-11-06 NOTE — Progress Notes (Addendum)
 " PROGRESS NOTE    [name]  FMW:[id number] DOB: 05-16-1981 DOA: 11/05/2024 PCP: Celestia Rosaline SQUIBB, NP    Brief Narrative:   44 year old with history of bipolar 1, HTN admitted for left-sided testicular pain.  Urology was consulted and ultrasound this time consistent with no flow concerning for acute left testicular torsion.  Initially surgery was planned per urology along with IV antibiotics but patient changed his mind.  Assessment & Plan:    L testicular pain secondary to acute torsion   Urology was consulted and ultrasound this time consistent with no flow concerning for acute left testicular torsion.  Initially surgery was planned per urology along with IV antibiotics but patient changed his mind. - GC/chlamydia   Bradycardia Holding beta-blocker continue to monitor   HTN -Lisinopril .  IV as needed   Bipolar disorder type 1 Continue Risperdal  at bedtime   Pulmonary nodules 2 mm irregular left upper lobe nodule, 2 mm left lower lobe nodule, and focal left upper lobe ground-glass opacity on CT chest -As per Fleischner Society Guidelines, no routine follow-up is recommended for multiple solid nodules <6 mm if low risk; if high risk, optional noncontrast chest CT at 12 months.    DVT prophylaxis: Lovenox     Code Status: Full Code Family Communication:   Status is: Inpatient Remains inpatient appropriate because: Awaiting surgery today   PT Follow up Recs:   Subjective: Patient changed his mind for surgery   Examination:  General exam: Appears calm and comfortable  Respiratory system: Clear to auscultation. Respiratory effort normal. Cardiovascular system: S1 & S2 heard, RRR. No JVD, murmurs, rubs, gallops or clicks. No pedal edema. Gastrointestinal system: Abdomen is nondistended, soft and nontender. No organomegaly or masses felt. Normal bowel sounds heard. Central nervous system: Alert and oriented. No focal neurological deficits. Extremities: Symmetric 5 x  5 power. Skin: No rashes, lesions or ulcers Psychiatry: Judgement and insight appear normal. Mood & affect appropriate.                Diet Orders (From admission, onward)     Start     Ordered   11/06/24 0911  Diet Carb Modified Room service appropriate? Yes  Diet effective now       Question Answer Comment  Diet-HS Snack? Nothing   Calorie Level Medium 1600-2000   Fluid consistency: Thin   Room service appropriate? Yes      11/06/24 0910            Objective: Vitals:   11/05/24 1807 11/05/24 1956 11/06/24 0319 11/06/24 0754  BP: 134/74 112/69 95/67 (!) 114/57  Pulse: (!) 55 (!) 52 (!) 53 62  Resp: 16   17  Temp: 98.1 F (36.7 C) 98.6 F (37 C) 98.7 F (37.1 C) 98.4 F (36.9 C)  TempSrc:  Oral Oral   SpO2: 99% 100% 100% 100%    Intake/Output Summary (Last 24 hours) at 11/06/2024 1022 Last data filed at 11/06/2024 0900 Gross per 24 hour  Intake 0 ml  Output --  Net 0 ml   There were no vitals filed for this visit.  Scheduled Meds:  acetaminophen   1,000 mg Oral TID   aspirin   325 mg Oral Daily   atorvastatin   20 mg Oral Daily   enoxaparin  (LOVENOX ) injection  40 mg Subcutaneous Q24H   gabapentin   100 mg Oral TID   lisinopril   10 mg Oral Daily   methocarbamol   500 mg Oral TID   risperiDONE   1 mg  Oral QHS   Continuous Infusions:  levofloxacin  (LEVAQUIN ) IV 750 mg (11/06/24 0834)    Nutritional status     There is no height or weight on file to calculate BMI.  Data Reviewed:   CBC: Recent Labs  Lab 11/05/24 0416 11/06/24 0549  WBC 13.0* 10.8*  HGB 16.2 15.2  HCT 49.0 45.9  MCV 93.0 91.4  PLT 141* 131*   Basic Metabolic Panel: Recent Labs  Lab 11/05/24 0416 11/06/24 0549  NA 142 138  K 4.2 4.1  CL 106 105  CO2 27 26  GLUCOSE 117* 89  BUN 19 12  CREATININE 1.02 0.93  CALCIUM  9.2 8.7*   GFR: CrCl cannot be calculated (Unknown ideal weight.). Liver Function Tests: Recent Labs  Lab 11/05/24 0416  AST 25  ALT 37   ALKPHOS 63  BILITOT 0.3  PROT 6.4*  ALBUMIN 4.0   Recent Labs  Lab 11/05/24 0416  LIPASE 32   No results for input(s): AMMONIA in the last 168 hours. Coagulation Profile: No results for input(s): INR, PROTIME in the last 168 hours. Cardiac Enzymes: No results for input(s): CKTOTAL, CKMB, CKMBINDEX, TROPONINI in the last 168 hours. BNP (last 3 results) No results for input(s): PROBNP in the last 8760 hours. HbA1C: No results for input(s): HGBA1C in the last 72 hours. CBG: No results for input(s): GLUCAP in the last 168 hours. Lipid Profile: No results for input(s): CHOL, HDL, LDLCALC, TRIG, CHOLHDL, LDLDIRECT in the last 72 hours. Thyroid  Function Tests: No results for input(s): TSH, T4TOTAL, FREET4, T3FREE, THYROIDAB in the last 72 hours. Anemia Panel: No results for input(s): VITAMINB12, FOLATE, FERRITIN, TIBC, IRON, RETICCTPCT in the last 72 hours. Sepsis Labs: No results for input(s): PROCALCITON, LATICACIDVEN in the last 168 hours.  No results found for this or any previous visit (from the past 240 hours).       Radiology Studies: US  SCROTUM W/DOPPLER Result Date: 11/05/2024 CLINICAL DATA:  Left testicular pain. EXAM: SCROTAL ULTRASOUND DOPPLER ULTRASOUND OF THE TESTICLES TECHNIQUE: Complete ultrasound examination of the testicles, epididymis, and other scrotal structures was performed. Color and spectral Doppler ultrasound were also utilized to evaluate blood flow to the testicles. COMPARISON:  Ultrasound exam from 2 hours prior. FINDINGS: Right testicle Measurements: 5.0 x 3.1 x 3.1 cm. No mass or microlithiasis visualized. Physiologic volume fluid in the right hemiscrotum. Doppler: There is normal vascularity on color doppler examination. Spectral doppler arterial and venous waveforms are normal. Left testicle Measurements:  4.5 x 4.0 x 3.8 cm. No mass or microlithiasis visualized. Doppler: As on the previous  study, the left testicle appears hypovascular compared to the contralateral testicle. However on the current study there is linear color signal on Doppler imaging with spectral waveform suggesting venous etiology. Spectral imaging also suggests that there is attenuated arterial flow to the parenchyma of the left testicle. No intratesticular mass lesion evident. Small simple hydrocele associated. Right epididymis:  Normal in size and appearance. Left epididymis:  Normal in size and appearance. Hydrocele:  As above. Varicocele:  None visualized. IMPRESSION: 1. On the current study, the left testicle is hypovascular compared to the contralateral testicle but flow signal is identified in the parenchyma of the left testicle on color Doppler imaging. Although modest, this flow signal does appear new compared to the ultrasound performed 2 hours prior. Spectral imaging suggests venous flow, potentially with attenuated arterial flow to the parenchyma of the left testicle. Given the presence of detectable flow to the left testicle, this would not  be entirely consistent with testicular avascularity due to torsion. However findings are compatible with a degree or a component of vascular compromise to the left testicle or another etiology resulting in hypovascularity of the left testicular parenchyma. 2. Normal sonographic appearance of the right testicle. Findings were discussed with Dr. Shane at the time of study interpretation at approximately 0700 hours on 11/05/2024. Electronically Signed   By: Camellia Candle M.D.   On: 11/05/2024 07:23   CT Angio Chest/Abd/Pel for Dissection W and/or Wo Contrast Result Date: 11/05/2024 EXAM: CTA CHEST, ABDOMEN AND PELVIS WITHOUT AND WITH CONTRAST 11/05/2024 05:32:44 AM TECHNIQUE: CTA of the chest was performed without and with the administration of intravenous contrast. CTA of the abdomen and pelvis was performed without and with the administration of intravenous contrast. 75 mL of  iohexol  (OMNIPAQUE ) 350 MG/ML injection was administered. Multiplanar reformatted images are provided for review. MIP images are provided for review. Automated exposure control, iterative reconstruction, and/or weight based adjustment of the mA/kV was utilized to reduce the radiation dose to as low as reasonably achievable. COMPARISON: None available. CLINICAL HISTORY: Acute aortic syndrome (AAS) suspected. FINDINGS: VASCULATURE: AORTA: No acute finding. No abdominal aortic aneurysm. No dissection. PULMONARY ARTERIES: No pulmonary embolism with the limits of this exam. GREAT VESSELS OF AORTIC ARCH: No acute finding. No dissection. No arterial occlusion or significant stenosis. CELIAC TRUNK: No acute finding. No occlusion or significant stenosis. SUPERIOR MESENTERIC ARTERY: No acute finding. No occlusion or significant stenosis. INFERIOR MESENTERIC ARTERY: No acute finding. No occlusion or significant stenosis. RENAL ARTERIES: No acute finding. No occlusion or significant stenosis. ILIAC ARTERIES: No acute finding. No occlusion or significant stenosis. CHEST: MEDIASTINUM: No mediastinal lymphadenopathy. The heart and pericardium demonstrate no acute abnormality. LUNGS AND PLEURA: There is a 2 mm irregular nodular opacity present in the lateral periphery of the left upper lobe on image 61 of series 6. A focal ground-glass opacity is present anteriorly within the left upper lobe on image 92. A 2 mm nodular opacity is present within the left lower lobe on image 132. There is minimal plate-like atelectasis in the dependent aspect of the right lower lobe. No evidence of pleural effusion or pneumothorax. THORACIC BONES AND SOFT TISSUES: No acute bone or soft tissue abnormality. ABDOMEN AND PELVIS: LIVER: The liver is unremarkable. GALLBLADDER AND BILE DUCTS: Gallbladder is unremarkable. No biliary ductal dilatation. SPLEEN: The spleen is unremarkable. PANCREAS: The pancreas is unremarkable. ADRENAL GLANDS: Bilateral  adrenal glands demonstrate no acute abnormality. KIDNEYS, URETERS AND BLADDER: No stones in the kidneys or ureters. No hydronephrosis. No perinephric or periureteral stranding. Urinary bladder is unremarkable. GI AND BOWEL: Stomach and duodenal sweep demonstrate no acute abnormality. There is no bowel obstruction. No abnormal bowel wall thickening or distension. REPRODUCTIVE: Reproductive organs are unremarkable. PERITONEUM AND RETROPERITONEUM: No ascites or free air. LYMPH NODES: No lymphadenopathy. ABDOMINAL BONES AND SOFT TISSUES: No acute abnormality of the bones. No acute soft tissue abnormality. IMPRESSION: 1. No evidence of acute aortic syndrome. 2. 2 mm irregular left upper lobe nodule, 2 mm left lower lobe nodule, and focal left upper lobe ground-glass opacity. As per Fleischner Society Guidelines, no routine follow-up is recommended for multiple solid nodules <6 mm if low risk; if high risk, optional noncontrast chest CT at 12 months. Minimal dependent right lower lobe atelectasis. Electronically signed by: Evalene Coho MD 11/05/2024 05:57 AM EST RP Workstation: [id number]   US  SCROTUM W/DOPPLER Result Date: 11/05/2024 EXAM: ULTRASOUND SCROTUM/TESTICLES WITH DOPPLER FLOW EVALUATION 11/05/2024 05:11:25 AM  TECHNIQUE: Duplex ultrasound using B-mode/gray scaled imaging, Doppler spectral analysis and color flow Doppler was obtained of the testicles. COMPARISON: None available. CLINICAL HISTORY: testicular pain FINDINGS: RIGHT: GREY SCALE: The right testicle measures 4.6 x 2.9 x 2.9 cm. It demonstrates normal homogeneous echotexture without focal lesion. No testicular microlithiasis. DOPPLER EVALUATION: There is normal arterial and venous Doppler flow within the testicle. VARICOCELE: No scrotal varicocele. SCROTAL SAC: No hydrocele. EPIDIDYMIS: The right epididymis is unremarkable. LEFT: GREY SCALE: The left testicle is mildly enlarged and echogenic relative to the right. It measures 4.8 x 3.4 x 3.9 cm. No  testicular microlithiasis. DOPPLER EVALUATION: The left testicle demonstrates no arterial or venous blood flow. The most likely differential diagnosis based on these findings is left testicular torsion. Other considerations include epididymo-orchitis with severe ischemia or infarction. Immediate urological consultation is indicated for suspected testicular torsion. VARICOCELE: No scrotal varicocele. SCROTAL SAC: There is a left hydrocele. EPIDIDYMIS: There is no blood flow within the left epididymis demonstrated. IMPRESSION: 1. Findings most consistent with acute left testicular torsion with associated left epididymal ischemia/infarction (no arterial or venous flow in the left testis or epididymis, mildly enlarged/echogenic). Most likely differential considerations include global testicular infarction from a non-torsion etiology (e.g., severe epididymo-orchitis with vascular compromise) and less likely acute traumatic/vascular injury. Emergent urologic consultation is recommended. Findings were discussed with Dr. Theadore at 5:23 am 11/05/24. 2. Left hydrocele. Electronically signed by: Evalene Coho MD 11/05/2024 05:24 AM EST RP Workstation: [id number]           LOS: 1 day   Time spent= 35 mins    Burgess JAYSON Dare, MD [facility]  If 7PM-7AM, please contact night-coverage  11/06/2024, 10:22 AM  "

## 2024-11-06 NOTE — Progress Notes (Signed)
 "  Subjective: Pain is unchanged, no N/V/F/C.   Objective: Vital signs in last 24 hours: Temp:  [98.1 F (36.7 C)-98.7 F (37.1 C)] 98.7 F (37.1 C) (01/06 0319) Pulse Rate:  [52-55] 53 (01/06 0319) Resp:  [16-17] 16 (01/05 1807) BP: (95-134)/(67-77) 95/67 (01/06 0319) SpO2:  [99 %-100 %] 100 % (01/06 0319)  Intake/Output from previous day: 01/05 0701 - 01/06 0700 In: 1000 [IV Piggyback:1000] Out: -  Intake/Output this shift: No intake/output data recorded.  Physical Exam:  General: Alert and oriented CV: RRR Lungs: Clear Abdomen: Soft, tender GU:No foley, R testicle no pain L testicel with severe epididymal pain, swollen L testicle   Lab Results: Recent Labs    11/05/24 0416 11/06/24 0549  HGB 16.2 15.2  HCT 49.0 45.9   BMET Recent Labs    11/05/24 0416 11/06/24 0549  NA 142 138  K 4.2 4.1  CL 106 105  CO2 27 26  GLUCOSE 117* 89  BUN 19 12  CREATININE 1.02 0.93  CALCIUM  9.2 8.7*     Studies/Results: US  SCROTUM W/DOPPLER Result Date: 11/05/2024 CLINICAL DATA:  Left testicular pain. EXAM: SCROTAL ULTRASOUND DOPPLER ULTRASOUND OF THE TESTICLES TECHNIQUE: Complete ultrasound examination of the testicles, epididymis, and other scrotal structures was performed. Color and spectral Doppler ultrasound were also utilized to evaluate blood flow to the testicles. COMPARISON:  Ultrasound exam from 2 hours prior. FINDINGS: Right testicle Measurements: 5.0 x 3.1 x 3.1 cm. No mass or microlithiasis visualized. Physiologic volume fluid in the right hemiscrotum. Doppler: There is normal vascularity on color doppler examination. Spectral doppler arterial and venous waveforms are normal. Left testicle Measurements:  4.5 x 4.0 x 3.8 cm. No mass or microlithiasis visualized. Doppler: As on the previous study, the left testicle appears hypovascular compared to the contralateral testicle. However on the current study there is linear color signal on Doppler imaging with spectral waveform  suggesting venous etiology. Spectral imaging also suggests that there is attenuated arterial flow to the parenchyma of the left testicle. No intratesticular mass lesion evident. Small simple hydrocele associated. Right epididymis:  Normal in size and appearance. Left epididymis:  Normal in size and appearance. Hydrocele:  As above. Varicocele:  None visualized. IMPRESSION: 1. On the current study, the left testicle is hypovascular compared to the contralateral testicle but flow signal is identified in the parenchyma of the left testicle on color Doppler imaging. Although modest, this flow signal does appear new compared to the ultrasound performed 2 hours prior. Spectral imaging suggests venous flow, potentially with attenuated arterial flow to the parenchyma of the left testicle. Given the presence of detectable flow to the left testicle, this would not be entirely consistent with testicular avascularity due to torsion. However findings are compatible with a degree or a component of vascular compromise to the left testicle or another etiology resulting in hypovascularity of the left testicular parenchyma. 2. Normal sonographic appearance of the right testicle. Findings were discussed with Dr. Shane at the time of study interpretation at approximately 0700 hours on 11/05/2024. Electronically Signed   By: Camellia Candle M.D.   On: 11/05/2024 07:23   CT Angio Chest/Abd/Pel for Dissection W and/or Wo Contrast Result Date: 11/05/2024 EXAM: CTA CHEST, ABDOMEN AND PELVIS WITHOUT AND WITH CONTRAST 11/05/2024 05:32:44 AM TECHNIQUE: CTA of the chest was performed without and with the administration of intravenous contrast. CTA of the abdomen and pelvis was performed without and with the administration of intravenous contrast. 75 mL of iohexol  (OMNIPAQUE ) 350 MG/ML  injection was administered. Multiplanar reformatted images are provided for review. MIP images are provided for review. Automated exposure control, iterative  reconstruction, and/or weight based adjustment of the mA/kV was utilized to reduce the radiation dose to as low as reasonably achievable. COMPARISON: None available. CLINICAL HISTORY: Acute aortic syndrome (AAS) suspected. FINDINGS: VASCULATURE: AORTA: No acute finding. No abdominal aortic aneurysm. No dissection. PULMONARY ARTERIES: No pulmonary embolism with the limits of this exam. GREAT VESSELS OF AORTIC ARCH: No acute finding. No dissection. No arterial occlusion or significant stenosis. CELIAC TRUNK: No acute finding. No occlusion or significant stenosis. SUPERIOR MESENTERIC ARTERY: No acute finding. No occlusion or significant stenosis. INFERIOR MESENTERIC ARTERY: No acute finding. No occlusion or significant stenosis. RENAL ARTERIES: No acute finding. No occlusion or significant stenosis. ILIAC ARTERIES: No acute finding. No occlusion or significant stenosis. CHEST: MEDIASTINUM: No mediastinal lymphadenopathy. The heart and pericardium demonstrate no acute abnormality. LUNGS AND PLEURA: There is a 2 mm irregular nodular opacity present in the lateral periphery of the left upper lobe on image 61 of series 6. A focal ground-glass opacity is present anteriorly within the left upper lobe on image 92. A 2 mm nodular opacity is present within the left lower lobe on image 132. There is minimal plate-like atelectasis in the dependent aspect of the right lower lobe. No evidence of pleural effusion or pneumothorax. THORACIC BONES AND SOFT TISSUES: No acute bone or soft tissue abnormality. ABDOMEN AND PELVIS: LIVER: The liver is unremarkable. GALLBLADDER AND BILE DUCTS: Gallbladder is unremarkable. No biliary ductal dilatation. SPLEEN: The spleen is unremarkable. PANCREAS: The pancreas is unremarkable. ADRENAL GLANDS: Bilateral adrenal glands demonstrate no acute abnormality. KIDNEYS, URETERS AND BLADDER: No stones in the kidneys or ureters. No hydronephrosis. No perinephric or periureteral stranding. Urinary bladder  is unremarkable. GI AND BOWEL: Stomach and duodenal sweep demonstrate no acute abnormality. There is no bowel obstruction. No abnormal bowel wall thickening or distension. REPRODUCTIVE: Reproductive organs are unremarkable. PERITONEUM AND RETROPERITONEUM: No ascites or free air. LYMPH NODES: No lymphadenopathy. ABDOMINAL BONES AND SOFT TISSUES: No acute abnormality of the bones. No acute soft tissue abnormality. IMPRESSION: 1. No evidence of acute aortic syndrome. 2. 2 mm irregular left upper lobe nodule, 2 mm left lower lobe nodule, and focal left upper lobe ground-glass opacity. As per Fleischner Society Guidelines, no routine follow-up is recommended for multiple solid nodules <6 mm if low risk; if high risk, optional noncontrast chest CT at 12 months. Minimal dependent right lower lobe atelectasis. Electronically signed by: Evalene Coho MD 11/05/2024 05:57 AM EST RP Workstation: [id number]   US  SCROTUM W/DOPPLER Result Date: 11/05/2024 EXAM: ULTRASOUND SCROTUM/TESTICLES WITH DOPPLER FLOW EVALUATION 11/05/2024 05:11:25 AM TECHNIQUE: Duplex ultrasound using B-mode/gray scaled imaging, Doppler spectral analysis and color flow Doppler was obtained of the testicles. COMPARISON: None available. CLINICAL HISTORY: testicular pain FINDINGS: RIGHT: GREY SCALE: The right testicle measures 4.6 x 2.9 x 2.9 cm. It demonstrates normal homogeneous echotexture without focal lesion. No testicular microlithiasis. DOPPLER EVALUATION: There is normal arterial and venous Doppler flow within the testicle. VARICOCELE: No scrotal varicocele. SCROTAL SAC: No hydrocele. EPIDIDYMIS: The right epididymis is unremarkable. LEFT: GREY SCALE: The left testicle is mildly enlarged and echogenic relative to the right. It measures 4.8 x 3.4 x 3.9 cm. No testicular microlithiasis. DOPPLER EVALUATION: The left testicle demonstrates no arterial or venous blood flow. The most likely differential diagnosis based on these findings is left  testicular torsion. Other considerations include epididymo-orchitis with severe ischemia or infarction. Immediate urological consultation  is indicated for suspected testicular torsion. VARICOCELE: No scrotal varicocele. SCROTAL SAC: There is a left hydrocele. EPIDIDYMIS: There is no blood flow within the left epididymis demonstrated. IMPRESSION: 1. Findings most consistent with acute left testicular torsion with associated left epididymal ischemia/infarction (no arterial or venous flow in the left testis or epididymis, mildly enlarged/echogenic). Most likely differential considerations include global testicular infarction from a non-torsion etiology (e.g., severe epididymo-orchitis with vascular compromise) and less likely acute traumatic/vascular injury. Emergent urologic consultation is recommended. Findings were discussed with Dr. Theadore at 5:23 am 11/05/24. 2. Left hydrocele. Electronically signed by: Evalene Coho MD 11/05/2024 05:24 AM EST RP Workstation: [id number]   A/P: 83 M presented with L testicular pain history not consistent with torsion repeat US  confirmed flow to the L testicle. Current differential epididymo-orchitis, venous occlusion or gonadal vessels, mild torsion. At the time of presentation the patient had already had pain for 2 days. Decision was offered to go to the OR or observe to see if conservative measures would improve pain. Pt opted for conservative measures:  Recommendations: - PT wants to hold off on surgery - make NPO at midnight - continue CTX - f/u urine culture - continue ASA - scrotal support  - pain management with multimodal pain regimen    LOS: 1 day   Jackey Pea MD 11/06/2024, 7:07 AM [facility]     "

## 2024-11-06 NOTE — Hospital Course (Addendum)
 Brief Narrative:   44 year old with history of bipolar 1, HTN admitted for left-sided testicular pain.    Urology was consulted and ultrasound this time consistent with no flow concerning for acute left testicular torsion.  Initially declined surgery eventually agreed for surgical intervention.  Patient underwent left orchiectomy and right orchiopexy on 1/7.  Medically stable for discharge  Assessment & Plan:    L testicular pain secondary to acute torsion   Urology was consulted and ultrasound this time consistent with no flow concerning for acute left testicular torsion.  Initially declined surgery eventually agreed for surgical intervention.  Patient underwent left orchiectomy and right orchiopexy on 1/7. - GC/chlamydia -negative     HTN Will resume home p.o. medications upon discharge   Bipolar disorder type 1 Continue Risperdal  at bedtime   Pulmonary nodules 2 mm irregular left upper lobe nodule, 2 mm left lower lobe nodule, and focal left upper lobe ground-glass opacity on CT chest -As per Fleischner Society Guidelines, no routine follow-up is recommended for multiple solid nodules <6 mm if low risk; if high risk, optional noncontrast chest CT at 12 months.    DVT prophylaxis: Lovenox     Code Status: Full Code Family Communication:   Status is: Inpatient Remains inpatient appropriate because: Discharge later today PT Follow up Recs:   Subjective: Doing well no complaints.  Tolerated his surgery yesterday  Examination:  General exam: Appears calm and comfortable  Respiratory system: Clear to auscultation. Respiratory effort normal. Cardiovascular system: S1 & S2 heard, RRR. No JVD, murmurs, rubs, gallops or clicks. No pedal edema. Gastrointestinal system: Abdomen is nondistended, soft and nontender. No organomegaly or masses felt. Normal bowel sounds heard. Central nervous system: Alert and oriented. No focal neurological deficits. Extremities: Symmetric 5 x 5  power. Skin: No rashes, lesions or ulcers Psychiatry: Judgement and insight appear normal. Mood & affect appropriate.

## 2024-11-06 NOTE — Progress Notes (Signed)
 This patient is receiving the antibiotic levofloxacin  by the intravenous route.   Based on criteria approved by the Pharmacy and Therapeutics Committee, and the  Infectious Disease Division, the antibiotic(s) is / are being converted to equivalent oral dose form(s). These criteria include:  Patient being treated for a respiratory tract infection, urinary tract infection, cellulitis, or Clostridium Difficile associated diarrhea  The patient is not neutropenic and does not exhibit a GI malabsorption state  The patient is eating (either orally or per tube) and/or has been taking other orally administered medications for at least 24 hours.  The patient is improving clinically (physician assessment and a 24-hour Tmax of 100.5 F).   If you have questions about this conversion, please contact the pharmacy department.   Thank you for allowing pharmacy to be a part of this patients care.  Shelba Collier, PharmD, BCPS Clinical Pharmacist

## 2024-11-07 ENCOUNTER — Encounter (HOSPITAL_COMMUNITY): Payer: Self-pay | Admitting: Hospitalist

## 2024-11-07 ENCOUNTER — Inpatient Hospital Stay (HOSPITAL_COMMUNITY): Payer: MEDICAID | Admitting: Anesthesiology

## 2024-11-07 ENCOUNTER — Encounter (HOSPITAL_COMMUNITY)
Admission: EM | Disposition: A | Discharge status: Home / Self Care | Payer: Self-pay | Source: Home / Self Care | Attending: Hospitalist

## 2024-11-07 DIAGNOSIS — N50819 Testicular pain, unspecified: Secondary | ICD-10-CM

## 2024-11-07 DIAGNOSIS — N4400 Torsion of testis, unspecified: Secondary | ICD-10-CM

## 2024-11-07 HISTORY — PX: SCROTAL EXPLORATION: SHX2386

## 2024-11-07 HISTORY — PX: ORCHIECTOMY: SHX2116

## 2024-11-07 HISTORY — PX: ORCHIOPEXY: SHX479

## 2024-11-07 LAB — BASIC METABOLIC PANEL WITH GFR
Anion gap: 11 (ref 5–15)
BUN: 13 mg/dL (ref 6–20)
CO2: 22 mmol/L (ref 22–32)
Calcium: 8.9 mg/dL (ref 8.9–10.3)
Chloride: 103 mmol/L (ref 98–111)
Creatinine, Ser: 0.92 mg/dL (ref 0.61–1.24)
GFR, Estimated: >60 mL/min
Glucose, Bld: 95 mg/dL (ref 70–99)
Potassium: 4 mmol/L (ref 3.5–5.1)
Sodium: 136 mmol/L (ref 135–145)

## 2024-11-07 LAB — CBC
HCT: 46.2 % (ref 39.0–52.0)
Hemoglobin: 15.6 g/dL (ref 13.0–17.0)
MCH: 30.5 pg (ref 26.0–34.0)
MCHC: 33.8 g/dL (ref 30.0–36.0)
MCV: 90.2 fL (ref 80.0–100.0)
Platelets: 136 K/uL — ABNORMAL LOW (ref 150–400)
RBC: 5.12 MIL/uL (ref 4.22–5.81)
RDW: 13.1 % (ref 11.5–15.5)
WBC: 12.6 K/uL — ABNORMAL HIGH (ref 4.0–10.5)
nRBC: 0 % (ref 0.0–0.2)

## 2024-11-07 LAB — GC/CHLAMYDIA PROBE AMP (~~LOC~~) NOT AT ARMC
Chlamydia: NEGATIVE
Comment: NEGATIVE
Comment: NORMAL
Neisseria Gonorrhea: NEGATIVE

## 2024-11-07 MED ORDER — DEXAMETHASONE SOD PHOSPHATE PF 10 MG/ML IJ SOLN
INTRAMUSCULAR | Status: DC | PRN
  Administered 2024-11-07: 5 mg via INTRAVENOUS

## 2024-11-07 MED ORDER — BUPIVACAINE HCL 0.25 % IJ SOLN
INTRAMUSCULAR | Status: DC | PRN
  Administered 2024-11-07: 30 mL

## 2024-11-07 MED ORDER — MIDAZOLAM HCL (PF) 2 MG/2ML IJ SOLN
INTRAMUSCULAR | Status: DC | PRN
  Administered 2024-11-07: 2 mg via INTRAVENOUS

## 2024-11-07 MED ORDER — LACTATED RINGERS IV SOLN: INTRAVENOUS | Status: DC

## 2024-11-07 MED ORDER — OXYCODONE HCL 5 MG PO TABS: 5.0000 mg | ORAL_TABLET | Freq: Once | ORAL | Status: DC | PRN

## 2024-11-07 MED ORDER — FENTANYL CITRATE (PF) 250 MCG/5ML IJ SOLN
INTRAMUSCULAR | Status: DC | PRN
  Administered 2024-11-07: 50 ug via INTRAVENOUS
  Administered 2024-11-07 (×2): 25 ug via INTRAVENOUS

## 2024-11-07 MED ORDER — EPHEDRINE SULFATE-NACL 50-0.9 MG/10ML-% IV SOSY
PREFILLED_SYRINGE | INTRAVENOUS | Status: DC | PRN
  Administered 2024-11-07: 5 mg via INTRAVENOUS

## 2024-11-07 MED ORDER — ORAL CARE MOUTH RINSE: 15.0000 mL | Freq: Once | OROMUCOSAL | Status: AC

## 2024-11-07 MED ORDER — CEFAZOLIN SODIUM 1 G IJ SOLR
INTRAMUSCULAR | Status: AC
  Filled 2024-11-07: qty 20

## 2024-11-07 MED ORDER — ONDANSETRON HCL 4 MG/2ML IJ SOLN
INTRAMUSCULAR | Status: AC
  Filled 2024-11-07: qty 8

## 2024-11-07 MED ORDER — CHLORHEXIDINE GLUCONATE 0.12 % MT SOLN: 15.0000 mL | Freq: Once | OROMUCOSAL | Status: AC

## 2024-11-07 MED ORDER — ACETAMINOPHEN 10 MG/ML IV SOLN: 1000.0000 mg | Freq: Once | INTRAVENOUS | Status: DC | PRN

## 2024-11-07 MED ORDER — HYDROMORPHONE HCL 1 MG/ML IJ SOLN
INTRAMUSCULAR | Status: AC
  Filled 2024-11-07: qty 0.5

## 2024-11-07 MED ORDER — ONDANSETRON HCL 4 MG/2ML IJ SOLN
INTRAMUSCULAR | Status: DC | PRN
  Administered 2024-11-07: 4 mg via INTRAVENOUS

## 2024-11-07 MED ORDER — EPHEDRINE 5 MG/ML INJ
INTRAVENOUS | Status: AC
  Filled 2024-11-07: qty 20

## 2024-11-07 MED ORDER — FENTANYL CITRATE (PF) 100 MCG/2ML IJ SOLN: 25.0000 ug | INTRAMUSCULAR | Status: DC | PRN

## 2024-11-07 MED ORDER — ROCURONIUM BROMIDE 10 MG/ML (PF) SYRINGE
PREFILLED_SYRINGE | INTRAVENOUS | Status: AC
  Filled 2024-11-07: qty 30

## 2024-11-07 MED ORDER — PROPOFOL 10 MG/ML IV BOLUS
INTRAVENOUS | Status: DC | PRN
  Administered 2024-11-07: 80 mg via INTRAVENOUS
  Administered 2024-11-07: 200 mg via INTRAVENOUS

## 2024-11-07 MED ORDER — VANCOMYCIN HCL 1000 MG IV SOLR
INTRAVENOUS | Status: AC
  Filled 2024-11-07: qty 20

## 2024-11-07 MED ORDER — VANCOMYCIN HCL 1000 MG IV SOLR
INTRAVENOUS | Status: DC | PRN
  Administered 2024-11-07: 1000 mg

## 2024-11-07 MED ORDER — 0.9 % SODIUM CHLORIDE (POUR BTL) OPTIME
TOPICAL | Status: DC | PRN
  Administered 2024-11-07 (×2): 1000 mL

## 2024-11-07 MED ORDER — KETOROLAC TROMETHAMINE 30 MG/ML IJ SOLN
INTRAMUSCULAR | Status: DC | PRN
  Administered 2024-11-07: 30 mg via INTRAVENOUS

## 2024-11-07 MED ORDER — PHENYLEPHRINE 80 MCG/ML (10ML) SYRINGE FOR IV PUSH (FOR BLOOD PRESSURE SUPPORT)
PREFILLED_SYRINGE | INTRAVENOUS | Status: DC | PRN
  Administered 2024-11-07 (×7): 80 ug via INTRAVENOUS

## 2024-11-07 MED ORDER — OXYCODONE HCL 5 MG/5ML PO SOLN: 5.0000 mg | Freq: Once | ORAL | Status: DC | PRN

## 2024-11-07 MED ORDER — HYDROMORPHONE HCL 1 MG/ML IJ SOLN
INTRAMUSCULAR | Status: DC | PRN
  Administered 2024-11-07 (×2): .25 mg via INTRAVENOUS

## 2024-11-07 MED ORDER — LIDOCAINE 2% (20 MG/ML) 5 ML SYRINGE
INTRAMUSCULAR | Status: AC
  Filled 2024-11-07: qty 20

## 2024-11-07 MED ORDER — CEFAZOLIN SODIUM-DEXTROSE 2-3 GM-%(50ML) IV SOLR
INTRAVENOUS | Status: DC | PRN
  Administered 2024-11-07: 2 g via INTRAVENOUS

## 2024-11-07 MED ORDER — BACITRACIN ZINC 500 UNIT/GM EX OINT
TOPICAL_OINTMENT | CUTANEOUS | Status: AC
  Filled 2024-11-07: qty 28.35

## 2024-11-07 MED ORDER — MIDAZOLAM HCL 2 MG/2ML IJ SOLN
INTRAMUSCULAR | Status: AC
  Filled 2024-11-07: qty 2

## 2024-11-07 MED ORDER — PHENYLEPHRINE 80 MCG/ML (10ML) SYRINGE FOR IV PUSH (FOR BLOOD PRESSURE SUPPORT)
PREFILLED_SYRINGE | INTRAVENOUS | Status: AC
  Filled 2024-11-07: qty 30

## 2024-11-07 MED ORDER — CHLORHEXIDINE GLUCONATE 0.12 % MT SOLN
OROMUCOSAL | Status: AC
  Administered 2024-11-07: 15 mL via OROMUCOSAL
  Filled 2024-11-07: qty 15

## 2024-11-07 MED ORDER — DEXAMETHASONE SOD PHOSPHATE PF 10 MG/ML IJ SOLN
INTRAMUSCULAR | Status: AC
  Filled 2024-11-07: qty 1

## 2024-11-07 MED ORDER — PROPOFOL 10 MG/ML IV BOLUS
INTRAVENOUS | Status: AC
  Filled 2024-11-07: qty 20

## 2024-11-07 MED ORDER — FENTANYL CITRATE (PF) 100 MCG/2ML IJ SOLN
INTRAMUSCULAR | Status: AC
  Filled 2024-11-07: qty 2

## 2024-11-07 MED ORDER — BUPIVACAINE HCL (PF) 0.25 % IJ SOLN
INTRAMUSCULAR | Status: AC
  Filled 2024-11-07: qty 30

## 2024-11-07 MED ORDER — KETOROLAC TROMETHAMINE 30 MG/ML IJ SOLN
INTRAMUSCULAR | Status: AC
  Filled 2024-11-07: qty 2

## 2024-11-07 MED ORDER — LIDOCAINE 2% (20 MG/ML) 5 ML SYRINGE
INTRAMUSCULAR | Status: DC | PRN
  Administered 2024-11-07: 80 mg via INTRAVENOUS

## 2024-11-07 NOTE — Progress Notes (Signed)
 Spoke to patient on the phone regarding surgery his pain is not remitted he would like to have his testicle removed I explained the patient that if this is caused by significant infection and decreased flow or some kind of occlusion of the left testicle and the vasculature this could potentially happen on the right side resulting in the loss of testicle on the right side.  Patient voices understanding of this.  We discussed the procedure including potential injury to the testicle potentially progression of infection.  Possible orchiopexy the right side possible removal of the left testicle patient would like to proceed understands risk to fertility as well as testosterone production.

## 2024-11-07 NOTE — Transfer of Care (Signed)
 Immediate Anesthesia Transfer of Care Note  Patient: [name]  Procedure(s) Performed: EXPLORATION, SCROTUM ORCHIECTOMY (Left: Scrotum) ORCHIOPEXY ADULT (Right)  Patient Location: PACU  Anesthesia Type:General  Level of Consciousness: awake, alert , and oriented  Airway & Oxygen Therapy: Patient Spontanous Breathing and Patient connected to nasal cannula oxygen  Post-op Assessment: Report given to RN and Post -op Vital signs reviewed and stable  Post vital signs: Reviewed and stable  Last Vitals:  Vitals Value Taken Time  BP 106/52 11/07/24 19:38  Temp    Pulse 76 11/07/24 19:40  Resp 18 11/07/24 19:40  SpO2 94 % 11/07/24 19:40  Vitals shown include unfiled device data.  Last Pain:  Vitals:   11/07/24 1607  TempSrc: Oral  PainSc: 10-Worst pain ever      Patients Stated Pain Goal: 4 (11/07/24 1607)  Complications: No notable events documented.

## 2024-11-07 NOTE — Plan of Care (Signed)
   Problem: Activity: Goal: Risk for activity intolerance will decrease Outcome: Progressing   Problem: Coping: Goal: Level of anxiety will decrease Outcome: Progressing   Problem: Pain Managment: Goal: General experience of comfort will improve and/or be controlled Outcome: Progressing

## 2024-11-07 NOTE — Plan of Care (Signed)
  Problem: Education: Goal: Knowledge of General Education information will improve Description: Including pain rating scale, medication(s)/side effects and non-pharmacologic comfort measures Outcome: Progressing   Problem: Nutrition: Goal: Adequate nutrition will be maintained Outcome: Progressing   Problem: Pain Managment: Goal: General experience of comfort will improve and/or be controlled Outcome: Progressing

## 2024-11-07 NOTE — Progress Notes (Signed)
 " PROGRESS NOTE    [name]  FMW:[id number] DOB: [birth year] DOA: 11/05/2024 PCP: Celestia Rosaline SQUIBB, NP    Brief Narrative:   44 year old with history of bipolar 1, HTN admitted for left-sided testicular pain.  Urology was consulted and ultrasound this time consistent with no flow concerning for acute left testicular torsion.  Initially surgery was planned per urology along with IV antibiotics but patient changed his mind.  Assessment & Plan:    L testicular pain secondary to acute torsion   Urology was consulted and ultrasound this time consistent with no flow concerning for acute left testicular torsion.  Initially surgery was planned per urology along with IV antibiotics but patient changed his mind. - GC/chlamydia - pending.    Bradycardia Holding beta-blocker continue to monitor   HTN -Lisinopril .  IV as needed   Bipolar disorder type 1 Continue Risperdal  at bedtime   Pulmonary nodules 2 mm irregular left upper lobe nodule, 2 mm left lower lobe nodule, and focal left upper lobe ground-glass opacity on CT chest -As per Fleischner Society Guidelines, no routine follow-up is recommended for multiple solid nodules <6 mm if low risk; if high risk, optional noncontrast chest CT at 12 months.    DVT prophylaxis: Lovenox     Code Status: Full Code Family Communication:   Status is: Inpatient Remains inpatient appropriate because: Awaiting surgery today   PT Follow up Recs:   Subjective: Seen at bedside awaiting his surgery later today  Examination:  General exam: Appears calm and comfortable  Respiratory system: Clear to auscultation. Respiratory effort normal. Cardiovascular system: S1 & S2 heard, RRR. No JVD, murmurs, rubs, gallops or clicks. No pedal edema. Gastrointestinal system: Abdomen is nondistended, soft and nontender. No organomegaly or masses felt. Normal bowel sounds heard. Central nervous system: Alert and oriented. No focal neurological  deficits. Extremities: Symmetric 5 x 5 power. Skin: No rashes, lesions or ulcers Psychiatry: Judgement and insight appear normal. Mood & affect appropriate.                Diet Orders (From admission, onward)     Start     Ordered   11/07/24 0719  Diet NPO time specified  Diet effective now        11/07/24 0718            Objective: Vitals:   11/06/24 1400 11/06/24 2009 11/07/24 0415 11/07/24 0747  BP: 104/64 119/71 (!) 112/53 (!) 100/56  Pulse: (!) 57 69 72 77  Resp: 16 14 15 16   Temp: 98.5 F (36.9 C) 99.1 F (37.3 C) 99.2 F (37.3 C) 98.9 F (37.2 C)  TempSrc: Oral Oral Oral   SpO2: 100% 95% 94% 100%    Intake/Output Summary (Last 24 hours) at 11/07/2024 1155 Last data filed at 11/06/2024 1300 Gross per 24 hour  Intake 240 ml  Output --  Net 240 ml   There were no vitals filed for this visit.  Scheduled Meds:  acetaminophen   1,000 mg Oral TID   atorvastatin   20 mg Oral Daily   enoxaparin  (LOVENOX ) injection  40 mg Subcutaneous Q24H   gabapentin   100 mg Oral TID   levofloxacin   750 mg Oral Daily   lisinopril   10 mg Oral Daily   methocarbamol   500 mg Oral TID   risperiDONE   1 mg Oral QHS   Continuous Infusions:  Nutritional status     There is no height or weight on file to calculate BMI.  Data Reviewed:  CBC: Recent Labs  Lab 11/05/24 0416 11/06/24 0549 11/07/24 0447  WBC 13.0* 10.8* 12.6*  HGB 16.2 15.2 15.6  HCT 49.0 45.9 46.2  MCV 93.0 91.4 90.2  PLT 141* 131* 136*   Basic Metabolic Panel: Recent Labs  Lab 11/05/24 0416 11/06/24 0549 11/07/24 0447  NA 142 138 136  K 4.2 4.1 4.0  CL 106 105 103  CO2 27 26 22   GLUCOSE 117* 89 95  BUN 19 12 13   CREATININE 1.02 0.93 0.92  CALCIUM  9.2 8.7* 8.9   GFR: CrCl cannot be calculated (Unknown ideal weight.). Liver Function Tests: Recent Labs  Lab 11/05/24 0416  AST 25  ALT 37  ALKPHOS 63  BILITOT 0.3  PROT 6.4*  ALBUMIN 4.0   Recent Labs  Lab 11/05/24 0416  LIPASE  32   No results for input(s): AMMONIA in the last 168 hours. Coagulation Profile: No results for input(s): INR, PROTIME in the last 168 hours. Cardiac Enzymes: No results for input(s): CKTOTAL, CKMB, CKMBINDEX, TROPONINI in the last 168 hours. BNP (last 3 results) No results for input(s): PROBNP in the last 8760 hours. HbA1C: No results for input(s): HGBA1C in the last 72 hours. CBG: No results for input(s): GLUCAP in the last 168 hours. Lipid Profile: No results for input(s): CHOL, HDL, LDLCALC, TRIG, CHOLHDL, LDLDIRECT in the last 72 hours. Thyroid  Function Tests: No results for input(s): TSH, T4TOTAL, FREET4, T3FREE, THYROIDAB in the last 72 hours. Anemia Panel: No results for input(s): VITAMINB12, FOLATE, FERRITIN, TIBC, IRON, RETICCTPCT in the last 72 hours. Sepsis Labs: No results for input(s): PROCALCITON, LATICACIDVEN in the last 168 hours.  Recent Results (from the past 240 hours)  MRSA Next Gen by PCR, Nasal     Status: None   Collection Time: 11/06/24 10:27 AM   Specimen: Nasal Mucosa; Nasal Swab  Result Value Ref Range Status   MRSA by PCR Next Gen NOT DETECTED NOT DETECTED Final    Comment: (NOTE) The GeneXpert MRSA Assay (FDA approved for NASAL specimens only), is one component of a comprehensive MRSA colonization surveillance program. It is not intended to diagnose MRSA infection nor to guide or monitor treatment for MRSA infections. Test performance is not FDA approved in patients less than 45 years old. Performed at [facility], [address], [place], [place] [address]          Radiology Studies: No results found.         LOS: 2 days   Time spent= 35 mins    Burgess JAYSON Dare, MD [facility]  If 7PM-7AM, please contact night-coverage  11/07/2024, 11:55 AM  "

## 2024-11-07 NOTE — Progress Notes (Signed)
*   Day of Surgery * Subjective: Pain has not remitted wants testicle removed.   Objective: Vital signs in last 24 hours: Temp:  [98.2 F (36.8 C)-99.9 F (37.7 C)] 98.2 F (36.8 C) (01/07 1607) Pulse Rate:  [69-77] 77 (01/07 1607) Resp:  [14-16] 16 (01/07 1607) BP: (100-119)/(53-80) 116/80 (01/07 1607) SpO2:  [94 %-100 %] 97 % (01/07 1607) Weight:  [98.9 kg] 98.9 kg (01/07 1607)  Intake/Output from previous day: 01/06 0701 - 01/07 0700 In: 240 [P.O.:240] Out: -  Intake/Output this shift: No intake/output data recorded.  Physical Exam:  General: Alert and oriented CV: RRR Lungs: Clear Abdomen: Soft, ND, ATTP;  GU: no foley, L swollen testicle very painful   Lab Results: Recent Labs    11/05/24 0416 11/06/24 0549 11/07/24 0447  HGB 16.2 15.2 15.6  HCT 49.0 45.9 46.2   BMET Recent Labs    11/06/24 0549 11/07/24 0447  NA 138 136  K 4.1 4.0  CL 105 103  CO2 26 22  GLUCOSE 89 95  BUN 12 13  CREATININE 0.93 0.92  CALCIUM  8.7* 8.9     Studies/Results: No results found.  Assessment/Plan: 54 M presented with L testicular pain history not consistent with torsion repeat US  confirmed flow to the L testicle. Current differential epididymo-orchitis, venous occlusion or gonadal vessels, mild torsion. At the time of presentation the patient had already had pain for 2 days. Decision was offered to go to the OR or observe to see if conservative measures would improve pain. Pt opted for conservative measures.   11/07/24: pain has not improved patient now wants to pursue surgeyr    Recommendations: - NPO - stop ASA - to OR    Spoke to patient on the phone regarding surgery his pain is not remitted he would like to have his testicle removed I explained the patient that if this is caused by significant infection and decreased flow or some kind of occlusion of the left testicle and the vasculature this could potentially happen on the right side resulting in the loss of  testicle on the right side.  Patient voices understanding of this.   We discussed the procedure including potential injury to the testicle potentially progression of infection.  Possible orchiopexy the right side possible removal of the left testicle patient would like to proceed understands risk to fertility as well as testosterone production.  WE also disucssed that if the patient has no evidence of torsion we will not do an orchiopexy on the other side to prevent infecting or irritating the contralateral testicle. If there is evidence of torsion will perform pexi. I explained that torsion at the age of 36 is very unlikely and this pain is from other source such as embolism vs infection.   Pt wants to proceed.        LOS: 2 days   Jackey Pea MD 11/07/2024, 5:42 PM [facility]

## 2024-11-07 NOTE — Anesthesia Preprocedure Evaluation (Signed)
 "                                  Anesthesia Evaluation  Patient identified by MRN, date of birth, ID band Patient awake    Reviewed: Allergy & Precautions, NPO status , Patient's Chart, lab work & pertinent test results  History of Anesthesia Complications Negative for: history of anesthetic complications  Airway Mallampati: II  TM Distance: >3 FB     Dental  (+) Dental Advisory Given, Teeth Intact   Pulmonary neg shortness of breath, neg COPD, neg recent URI, Current Smoker and Patient abstained from smoking.   breath sounds clear to auscultation       Cardiovascular hypertension, Pt. on medications (-) angina (-) Past MI and (-) CHF  Rhythm:Regular     Neuro/Psych  PSYCHIATRIC DISORDERS   Bipolar Disorder   negative neurological ROS     GI/Hepatic negative GI ROS, Neg liver ROS,,,  Endo/Other  negative endocrine ROS    Renal/GU negative Renal ROSLab Results      Component                Value               Date                      NA                       136                 11/07/2024                K                        4.0                 11/07/2024                CO2                      22                  11/07/2024                GLUCOSE                  95                  11/07/2024                BUN                      13                  11/07/2024                CREATININE               0.92                11/07/2024                CALCIUM                   8.9  11/07/2024                EGFR                     95                  08/31/2022                GFRNONAA                 >60                 11/07/2024                Musculoskeletal negative musculoskeletal ROS (+)    Abdominal   Peds  Hematology negative hematology ROS (+) Lab Results      Component                Value               Date                      WBC                      12.6 (H)            11/07/2024                HGB                       15.6                11/07/2024                HCT                      46.2                11/07/2024                MCV                      90.2                11/07/2024                PLT                      136 (L)             11/07/2024              Anesthesia Other Findings   Reproductive/Obstetrics                              Anesthesia Physical Anesthesia Plan  ASA: 2  Anesthesia Plan: General   Post-op Pain Management: Ofirmev  IV (intra-op)* and Toradol  IV (intra-op)*   Induction: Intravenous  PONV Risk Score and Plan: 2 and Ondansetron  and Dexamethasone   Airway Management Planned: LMA  Additional Equipment: None  Intra-op Plan:   Post-operative Plan: Extubation in OR  Informed Consent: I have reviewed the patients History and Physical, chart, labs and discussed the procedure including the risks, benefits and alternatives for the proposed anesthesia with the patient or authorized representative who has indicated his/her understanding and acceptance.     Dental  advisory given  Plan Discussed with: CRNA  Anesthesia Plan Comments:         Anesthesia Quick Evaluation  "

## 2024-11-07 NOTE — Anesthesia Procedure Notes (Signed)
 Procedure Name: LMA Insertion Date/Time: 11/07/2024 6:01 PM  Performed by: Ethelyne Erich C., CRNAPre-anesthesia Checklist: Patient identified, Emergency Drugs available, Suction available, Patient being monitored and Timeout performed Patient Re-evaluated:Patient Re-evaluated prior to induction Oxygen Delivery Method: Circle system utilized Preoxygenation: Pre-oxygenation with 100% oxygen Induction Type: IV induction LMA: LMA inserted LMA Size: 4.0 Placement Confirmation: positive ETCO2 and breath sounds checked- equal and bilateral Tube secured with: Tape Dental Injury: Teeth and Oropharynx as per pre-operative assessment

## 2024-11-07 NOTE — Anesthesia Postprocedure Evaluation (Signed)
"   Anesthesia Post Note  Patient: [name]  Procedure(s) Performed: EXPLORATION, SCROTUM ORCHIECTOMY (Left: Scrotum) ORCHIOPEXY ADULT (Right)     Patient location during evaluation: PACU Anesthesia Type: General Level of consciousness: awake and alert, oriented and patient cooperative Pain management: pain level controlled Vital Signs Assessment: post-procedure vital signs reviewed and stable Respiratory status: spontaneous breathing, nonlabored ventilation and respiratory function stable Cardiovascular status: blood pressure returned to baseline and stable Postop Assessment: no apparent nausea or vomiting Anesthetic complications: no   No notable events documented.  Last Vitals:  Vitals:   11/07/24 1945 11/07/24 2000  BP: (!) 103/55 (!) 149/78  Pulse: 83 78  Resp: 20 17  Temp:  36.8 C  SpO2: 94% 95%    Last Pain:  Vitals:   11/07/24 2000  TempSrc:   PainSc: 0-No pain                 Alvie Fowles,E. Braylyn Eye      "

## 2024-11-07 NOTE — Op Note (Addendum)
 Operative note  Preoperative diagnosis: Left orchialgia  Postop diagnosis: Left testicular torsion  Procedures Performed:  Scrotal exploration Left orchiectomy  Right orchiopexy   Operative findings:  Bilateral Bell Clapper deformity Left testicle twisted 360 degrees Left testicle dark necrotic Doppler demonstrated no flow after 20 minutes of warm compress Right testicle pexied in 3 positions.   Anesthesia: General  Antibiotics: Ancef   EBL: 20 cc  Fluids: Per anesthesia  Urine output: Not applicable  Complications: None  Specimens: Left testicle  Indication procedure: [name] is a 44 year old male who initially presented to the ER with left testicular pain that have been going on for 2 days patient stated that the testicular pain came on very slowly since initial ultrasound was concerning for torsion but due to his odd story and irregular age for torsion a repeat ultrasound was completed this demonstrated flow to the left testicle indicating possible abnormal orchitis versus venous occlusion from a clot.  We gave the option of proceeding to surgery versus observation patient wanted to pursue observation his pain persisted for the next 2 days decision was made to go to the OR.  Patient understood the risks of possibly removing a testicle, concern for fertility and testosterone.  See the progress note for the consent description the patient was agreeable to the procedure and he wanted to proceed.  Procedure in detail: After informed consent was confirmed the preoperative and the patient was marked appropriately he was then taken to the operative suite and placed under anesthesia by the anesthesia team.  He was then shaved and then prepped and draped in usual sterile fashion  Next time out was formed verifying correct patient procedure and laterality.  Once timeout was performed the raphae was marked and then a transverse marking was made along the left hemiscrotum Marcaine  was  instilled into the left hemiscrotum at this area 15 blade was then used to make an incision dissection was taken down with cautery through the tunica vaginalis.  Once we encountered tunica vaginalis the testicle was delivered.  Testicle appeared very necrotic and black it was freed up from its lateral attachments it was noted to be twisted 360 degrees.  It was untwisted and then placed in a warm compress.  All bleeding areas were then bovied.  It was noted that the patient had a significant Bell Clapper deformity with abnormal attachments of the tunica vaginalis.  Due to the torsion noted on the left side decision was made to do an orchiopexy on the right side using the raphae as guidance we then made a transverse incision on the right side after Marcaine  was instilled into the area dissection was taken down through the dartos and then through the tunica vaginalis.  Once the tunica vaginalis the testicle was delivered the testicle was inspected make sure it was oriented correctly the testicle looked healthy we then made sure the lateral sulcus lateral the corpora straight then using 4-0 PDS suture the testicle was pexied in 3 separate locations at the lateral medial and inferior.  Testis was then placed back into the scrotum and the tissues were tied.  The dartos was then closed with a 2-0 Vicryl in a running fashion.  The skin was then closed a with a 3-0 Chromic Gut suture in horizontal mattress fashion running.  We then dopplered the left testicle and there was noted to be no flow decision was made to remove the testicle.  Once this completed attention was turned turned back to the left side  where the cord was noted to be very wide due to this it was separating the 3 distinct parts each part was then clamped.  Each distinct separation of the cord had a stick tied down with a 2-0 Vicryl and a free tie with 0 silk.  Once the testicle was free we inspected the transected cord there was no bleeding.  The  testicles then passed off for specimen.  We inspected the scrotal cavity there was no bleeding any bleeding areas with the cauterized.  We reinspected we appreciate no bleeding.  The area was then irrigated with vancomycin  infused saline.  The dartos was then closed using a running 2-0 Vicryl.  The skin was then closed with a horizontal mattress running 3-0 Chromic Gut suture.  Bacitracin  was placed over each incision the rest of Marcaine  was instilled into each incision.  Patient tolerated procedure well  Patient is an expert operatively and taken to the PACU in stable condition  Disposition: Patient can be discharged in the a.m. patient been put on regular diet will set up for follow-up.

## 2024-11-07 NOTE — Discharge Instructions (Signed)
 Discharge instructions following scrotal surgery  Call your doctor for: Fever is greater than 100.5 Severe nausea or vomiting Increasing pain not controlled by pain medication Increasing redness or drainage from incisions  The number for questions or concerns is [phone]  Activity level: No lifting greater than 20 pounds (about equal to milk) for the next 2 weeks or until cleared to do so at follow-up appointment.  Otherwise activity as tolerated by comfort level.  Diet: May resume your regular diet as tolerated  Driving: No driving while still taking opiate pain medications (weight at least 6-8 hours after last dose).  No driving if you still sore from surgery as it may limit her ability to react quickly if necessary.   Shower/bath: May shower and get incision wet pad dry immediately following.  Do not scrub vigorously for the next 2-3 weeks.  Do not soak incision (ID soaking in bath or swimming) until told he may do so by Dr., as this may promote a wound infection.  Wound care: He may cover wounds with sterile gauze as needed to prevent incisions rubbing on close follow-up in any seepage.  Where tight fitting underpants/scrotal support for at least 2 weeks.  He should apply cold compresses (ice or sac of frozen peas/corn) to your scrotum for at least 48 hours to reduce the swelling for 15 minutes at a time indirectly.  You should expect that his scrotum will swell up initially and then get smaller over the next 2-4 weeks.  Follow-up appointments: Follow-up appointment will be scheduled with Dr. Cathi Cluster for a wound check.

## 2024-11-08 ENCOUNTER — Encounter (HOSPITAL_COMMUNITY): Payer: Self-pay | Admitting: Urology

## 2024-11-08 ENCOUNTER — Other Ambulatory Visit (HOSPITAL_COMMUNITY): Payer: Self-pay

## 2024-11-08 LAB — BASIC METABOLIC PANEL WITH GFR
Anion gap: 10 (ref 5–15)
BUN: 17 mg/dL (ref 6–20)
CO2: 23 mmol/L (ref 22–32)
Calcium: 9.1 mg/dL (ref 8.9–10.3)
Chloride: 102 mmol/L (ref 98–111)
Creatinine, Ser: 0.84 mg/dL (ref 0.61–1.24)
GFR, Estimated: >60 mL/min
Glucose, Bld: 126 mg/dL — ABNORMAL HIGH (ref 70–99)
Potassium: 4.4 mmol/L (ref 3.5–5.1)
Sodium: 135 mmol/L (ref 135–145)

## 2024-11-08 LAB — MAGNESIUM: Magnesium: 2.1 mg/dL (ref 1.7–2.4)

## 2024-11-08 MED ORDER — SENNOSIDES-DOCUSATE SODIUM 8.6-50 MG PO TABS
2.0000 | ORAL_TABLET | Freq: Two times a day (BID) | ORAL | 0 refills | Status: AC | PRN
  Filled 2024-11-08: qty 60, 15d supply, fill #0

## 2024-11-08 MED ORDER — OXYCODONE HCL 5 MG PO TABS
5.0000 mg | ORAL_TABLET | ORAL | 0 refills | Status: AC | PRN
  Filled 2024-11-08: qty 30, 5d supply, fill #0

## 2024-11-08 MED ORDER — ACETAMINOPHEN 500 MG PO TABS: 1000.0000 mg | ORAL_TABLET | Freq: Three times a day (TID) | ORAL | Status: AC

## 2024-11-08 NOTE — Plan of Care (Signed)
   Problem: Activity: Goal: Risk for activity intolerance will decrease Outcome: Progressing   Problem: Nutrition: Goal: Adequate nutrition will be maintained Outcome: Progressing   Problem: Coping: Goal: Level of anxiety will decrease Outcome: Progressing

## 2024-11-08 NOTE — Care Management (Signed)
" °  Transition of Care [facility]) Screening Note   Patient Details  Name: [name] Date of Birth: [birth year]   Transition of Care [facility]) CM/SW Contact:    Corean JAYSON Canary, RN Phone Number: 11/08/2024, 8:43 AM    Transition of Care Department [facility]) has reviewed patient and no TOC needs have been identified at this time. We will continue to monitor patient advancement through interdisciplinary progression rounds. If new patient transition needs arise, please place a TOC consult SDOH food insecurity addressed "

## 2024-11-08 NOTE — Progress Notes (Signed)
 Discharge instructions (including medications) discussed with and copy provided to patient. Patient given the opportunity to ask questions. Questions clarified.

## 2024-11-08 NOTE — Discharge Summary (Signed)
 Physician Discharge Summary  [name] FMW:[id number] DOB: 08-06-1981 DOA: 11/05/2024  PCP: Celestia Rosaline SQUIBB, NP  Admit date: 11/05/2024 Discharge date: 11/08/2024  Admitted From: Home Disposition: Home  Recommendations for Outpatient Follow-up:  Follow up with PCP in 1-2 weeks Please obtain BMP/CBC in one week your next doctors visit.  Urology to arrange for outpatient follow-up Pain medication with bowel regimen prescribed   Discharge Condition: Stable CODE STATUS: Full code Diet recommendation: Low-salt  Brief/Interim Summary: Brief Narrative:   44 year old with history of bipolar 1, HTN admitted for left-sided testicular pain.    Urology was consulted and ultrasound this time consistent with no flow concerning for acute left testicular torsion.  Initially declined surgery eventually agreed for surgical intervention.  Patient underwent left orchiectomy and right orchiopexy on 1/7.  Medically stable for discharge  Assessment & Plan:    L testicular pain secondary to acute torsion   Urology was consulted and ultrasound this time consistent with no flow concerning for acute left testicular torsion.  Initially declined surgery eventually agreed for surgical intervention.  Patient underwent left orchiectomy and right orchiopexy on 1/7. - GC/chlamydia -negative     HTN Will resume home p.o. medications upon discharge   Bipolar disorder type 1 Continue Risperdal  at bedtime   Pulmonary nodules 2 mm irregular left upper lobe nodule, 2 mm left lower lobe nodule, and focal left upper lobe ground-glass opacity on CT chest -As per Fleischner Society Guidelines, no routine follow-up is recommended for multiple solid nodules <6 mm if low risk; if high risk, optional noncontrast chest CT at 12 months.    DVT prophylaxis: Lovenox     Code Status: Full Code Family Communication:   Status is: Inpatient Remains inpatient appropriate because: Discharge later today PT Follow up Recs:    Subjective: Doing well no complaints.  Tolerated his surgery yesterday  Examination:  General exam: Appears calm and comfortable  Respiratory system: Clear to auscultation. Respiratory effort normal. Cardiovascular system: S1 & S2 heard, RRR. No JVD, murmurs, rubs, gallops or clicks. No pedal edema. Gastrointestinal system: Abdomen is nondistended, soft and nontender. No organomegaly or masses felt. Normal bowel sounds heard. Central nervous system: Alert and oriented. No focal neurological deficits. Extremities: Symmetric 5 x 5 power. Skin: No rashes, lesions or ulcers Psychiatry: Judgement and insight appear normal. Mood & affect appropriate.    Discharge Diagnoses:  Principal Problem:   Testicular pain Active Problems:   Testicular pain, left      Discharge Exam: Vitals:   11/08/24 0308 11/08/24 0749  BP: 117/74 130/77  Pulse: 71 83  Resp:  16  Temp: 98.8 F (37.1 C) 98.3 F (36.8 C)  SpO2: 98% 97%   Vitals:   11/07/24 2015 11/07/24 2337 11/08/24 0308 11/08/24 0749  BP: (!) 140/87 105/61 117/74 130/77  Pulse: 81 73 71 83  Resp: 20 18  16   Temp: (!) 97.4 F (36.3 C) 99 F (37.2 C) 98.8 F (37.1 C) 98.3 F (36.8 C)  TempSrc:    Oral  SpO2: 99% 98% 98% 97%  Weight:      Height:          Discharge Instructions   Allergies as of 11/08/2024   No Known Allergies      Medication List     TAKE these medications    acetaminophen  500 MG tablet Commonly known as: TYLENOL  Take 2 tablets (1,000 mg total) by mouth 3 (three) times daily for 7 days.   albuterol  108 (90  Base) MCG/ACT inhaler Commonly known as: VENTOLIN  HFA Inhale 2 puffs into the lungs every 4 (four) hours as needed for wheezing or shortness of breath.   atorvastatin  20 MG tablet Commonly known as: LIPITOR Take 1 tablet (20 mg total) by mouth daily.   gabapentin  100 MG capsule Commonly known as: NEURONTIN  Take 1 capsule (100 mg total) by mouth 3 (three) times daily.   hydrOXYzine   25 MG tablet Commonly known as: ATARAX  Take 1 tablet (25 mg total) by mouth 3 (three) times daily as needed for anxiety.   lisinopril  10 MG tablet Commonly known as: ZESTRIL  Take 1 tablet (10 mg total) by mouth daily.   oxyCODONE  5 MG immediate release tablet Commonly known as: Oxy IR/ROXICODONE  Take 1 tablet (5 mg total) by mouth every 4 (four) hours as needed for moderate pain (pain score 4-6) or severe pain (pain score 7-10).   propranolol  10 MG tablet Commonly known as: INDERAL  Take 1 tablet (10 mg total) by mouth 2 (two) times daily.   risperiDONE  1 MG tablet Commonly known as: RISPERDAL  Take 1 tablet (1 mg total) by mouth at bedtime.   senna-docusate 8.6-50 MG tablet Commonly known as: Senokot-S Take 2 tablets by mouth 2 (two) times daily as needed for moderate constipation.   traZODone  50 MG tablet Commonly known as: DESYREL  Take 1 tablet (50 mg total) by mouth at bedtime as needed for sleep.        Follow-up Information     Celestia Rosaline SQUIBB, NP Follow up.   Specialty: Internal Medicine Contact information: [address] [place] [address] [phone]         Shane Steffan BROCKS, MD Follow up.   Specialty: Urology Why: you will be called by [facility] to set up f/u. [phone] Contact information: [address] [place] [address] [phone]         Celestia Rosaline SQUIBB, NP .   Specialty: Internal Medicine Contact information: [address] [place] [address] [phone]                Allergies[1]  You were cared for by a hospitalist during your hospital stay. If you have any questions about your discharge medications or the care you received while you were in the hospital after you are discharged, you can call the unit and asked to speak with the hospitalist on call if the hospitalist that took care of you is not available. Once you are discharged, your primary care physician will handle any  further medical issues. Please note that no refills for any discharge medications will be authorized once you are discharged, as it is imperative that you return to your primary care physician (or establish a relationship with a primary care physician if you do not have one) for your aftercare needs so that they can reassess your need for medications and monitor your lab values.  You were cared for by a hospitalist during your hospital stay. If you have any questions about your discharge medications or the care you received while you were in the hospital after you are discharged, you can call the unit and asked to speak with the hospitalist on call if the hospitalist that took care of you is not available. Once you are discharged, your primary care physician will handle any further medical issues. Please note that NO REFILLS for any discharge medications will be authorized once you are discharged, as it is imperative that you return to your primary care  physician (or establish a relationship with a primary care physician if you do not have one) for your aftercare needs so that they can reassess your need for medications and monitor your lab values.  Please request your Prim.MD to go over all Hospital Tests and Procedure/Radiological results at the follow up, please get all Hospital records sent to your Prim MD by signing hospital release before you go home.  Get CBC, CMP, 2 view Chest X ray checked  by Primary MD during your next visit or SNF MD in 5-7 days ( we routinely change or add medications that can affect your baseline labs and fluid status, therefore we recommend that you get the mentioned basic workup next visit with your PCP, your PCP may decide not to get them or add new tests based on their clinical decision)  On your next visit with your primary care physician please Get Medicines reviewed and adjusted.  If you experience worsening of your admission symptoms, develop shortness of breath, life  threatening emergency, suicidal or homicidal thoughts you must seek medical attention immediately by calling 911 or calling your MD immediately  if symptoms less severe.  You Must read complete instructions/literature along with all the possible adverse reactions/side effects for all the Medicines you take and that have been prescribed to you. Take any new Medicines after you have completely understood and accpet all the possible adverse reactions/side effects.   Do not drive, operate heavy machinery, perform activities at heights, swimming or participation in water activities or provide baby sitting services if your were admitted for syncope or siezures until you have seen by Primary MD or a Neurologist and advised to do so again.  Do not drive when taking Pain medications.   Procedures/Studies: US  SCROTUM W/DOPPLER Result Date: 11/05/2024 CLINICAL DATA:  Left testicular pain. EXAM: SCROTAL ULTRASOUND DOPPLER ULTRASOUND OF THE TESTICLES TECHNIQUE: Complete ultrasound examination of the testicles, epididymis, and other scrotal structures was performed. Color and spectral Doppler ultrasound were also utilized to evaluate blood flow to the testicles. COMPARISON:  Ultrasound exam from 2 hours prior. FINDINGS: Right testicle Measurements: 5.0 x 3.1 x 3.1 cm. No mass or microlithiasis visualized. Physiologic volume fluid in the right hemiscrotum. Doppler: There is normal vascularity on color doppler examination. Spectral doppler arterial and venous waveforms are normal. Left testicle Measurements:  4.5 x 4.0 x 3.8 cm. No mass or microlithiasis visualized. Doppler: As on the previous study, the left testicle appears hypovascular compared to the contralateral testicle. However on the current study there is linear color signal on Doppler imaging with spectral waveform suggesting venous etiology. Spectral imaging also suggests that there is attenuated arterial flow to the parenchyma of the left testicle. No  intratesticular mass lesion evident. Small simple hydrocele associated. Right epididymis:  Normal in size and appearance. Left epididymis:  Normal in size and appearance. Hydrocele:  As above. Varicocele:  None visualized. IMPRESSION: 1. On the current study, the left testicle is hypovascular compared to the contralateral testicle but flow signal is identified in the parenchyma of the left testicle on color Doppler imaging. Although modest, this flow signal does appear new compared to the ultrasound performed 2 hours prior. Spectral imaging suggests venous flow, potentially with attenuated arterial flow to the parenchyma of the left testicle. Given the presence of detectable flow to the left testicle, this would not be entirely consistent with testicular avascularity due to torsion. However findings are compatible with a degree or a component of vascular compromise to the left  testicle or another etiology resulting in hypovascularity of the left testicular parenchyma. 2. Normal sonographic appearance of the right testicle. Findings were discussed with Dr. Shane at the time of study interpretation at approximately 0700 hours on 11/05/2024. Electronically Signed   By: Camellia Candle M.D.   On: 11/05/2024 07:23   CT Angio Chest/Abd/Pel for Dissection W and/or Wo Contrast Result Date: 11/05/2024 EXAM: CTA CHEST, ABDOMEN AND PELVIS WITHOUT AND WITH CONTRAST 11/05/2024 05:32:44 AM TECHNIQUE: CTA of the chest was performed without and with the administration of intravenous contrast. CTA of the abdomen and pelvis was performed without and with the administration of intravenous contrast. 75 mL of iohexol  (OMNIPAQUE ) 350 MG/ML injection was administered. Multiplanar reformatted images are provided for review. MIP images are provided for review. Automated exposure control, iterative reconstruction, and/or weight based adjustment of the mA/kV was utilized to reduce the radiation dose to as low as reasonably achievable.  COMPARISON: None available. CLINICAL HISTORY: Acute aortic syndrome (AAS) suspected. FINDINGS: VASCULATURE: AORTA: No acute finding. No abdominal aortic aneurysm. No dissection. PULMONARY ARTERIES: No pulmonary embolism with the limits of this exam. GREAT VESSELS OF AORTIC ARCH: No acute finding. No dissection. No arterial occlusion or significant stenosis. CELIAC TRUNK: No acute finding. No occlusion or significant stenosis. SUPERIOR MESENTERIC ARTERY: No acute finding. No occlusion or significant stenosis. INFERIOR MESENTERIC ARTERY: No acute finding. No occlusion or significant stenosis. RENAL ARTERIES: No acute finding. No occlusion or significant stenosis. ILIAC ARTERIES: No acute finding. No occlusion or significant stenosis. CHEST: MEDIASTINUM: No mediastinal lymphadenopathy. The heart and pericardium demonstrate no acute abnormality. LUNGS AND PLEURA: There is a 2 mm irregular nodular opacity present in the lateral periphery of the left upper lobe on image 61 of series 6. A focal ground-glass opacity is present anteriorly within the left upper lobe on image 92. A 2 mm nodular opacity is present within the left lower lobe on image 132. There is minimal plate-like atelectasis in the dependent aspect of the right lower lobe. No evidence of pleural effusion or pneumothorax. THORACIC BONES AND SOFT TISSUES: No acute bone or soft tissue abnormality. ABDOMEN AND PELVIS: LIVER: The liver is unremarkable. GALLBLADDER AND BILE DUCTS: Gallbladder is unremarkable. No biliary ductal dilatation. SPLEEN: The spleen is unremarkable. PANCREAS: The pancreas is unremarkable. ADRENAL GLANDS: Bilateral adrenal glands demonstrate no acute abnormality. KIDNEYS, URETERS AND BLADDER: No stones in the kidneys or ureters. No hydronephrosis. No perinephric or periureteral stranding. Urinary bladder is unremarkable. GI AND BOWEL: Stomach and duodenal sweep demonstrate no acute abnormality. There is no bowel obstruction. No abnormal  bowel wall thickening or distension. REPRODUCTIVE: Reproductive organs are unremarkable. PERITONEUM AND RETROPERITONEUM: No ascites or free air. LYMPH NODES: No lymphadenopathy. ABDOMINAL BONES AND SOFT TISSUES: No acute abnormality of the bones. No acute soft tissue abnormality. IMPRESSION: 1. No evidence of acute aortic syndrome. 2. 2 mm irregular left upper lobe nodule, 2 mm left lower lobe nodule, and focal left upper lobe ground-glass opacity. As per Fleischner Society Guidelines, no routine follow-up is recommended for multiple solid nodules <6 mm if low risk; if high risk, optional noncontrast chest CT at 12 months. Minimal dependent right lower lobe atelectasis. Electronically signed by: Evalene Coho MD 11/05/2024 05:57 AM EST RP Workstation: [id number]   US  SCROTUM W/DOPPLER Result Date: 11/05/2024 EXAM: ULTRASOUND SCROTUM/TESTICLES WITH DOPPLER FLOW EVALUATION 11/05/2024 05:11:25 AM TECHNIQUE: Duplex ultrasound using B-mode/gray scaled imaging, Doppler spectral analysis and color flow Doppler was obtained of the testicles. COMPARISON: None available. CLINICAL HISTORY: testicular  pain FINDINGS: RIGHT: GREY SCALE: The right testicle measures 4.6 x 2.9 x 2.9 cm. It demonstrates normal homogeneous echotexture without focal lesion. No testicular microlithiasis. DOPPLER EVALUATION: There is normal arterial and venous Doppler flow within the testicle. VARICOCELE: No scrotal varicocele. SCROTAL SAC: No hydrocele. EPIDIDYMIS: The right epididymis is unremarkable. LEFT: GREY SCALE: The left testicle is mildly enlarged and echogenic relative to the right. It measures 4.8 x 3.4 x 3.9 cm. No testicular microlithiasis. DOPPLER EVALUATION: The left testicle demonstrates no arterial or venous blood flow. The most likely differential diagnosis based on these findings is left testicular torsion. Other considerations include epididymo-orchitis with severe ischemia or infarction. Immediate urological consultation is  indicated for suspected testicular torsion. VARICOCELE: No scrotal varicocele. SCROTAL SAC: There is a left hydrocele. EPIDIDYMIS: There is no blood flow within the left epididymis demonstrated. IMPRESSION: 1. Findings most consistent with acute left testicular torsion with associated left epididymal ischemia/infarction (no arterial or venous flow in the left testis or epididymis, mildly enlarged/echogenic). Most likely differential considerations include global testicular infarction from a non-torsion etiology (e.g., severe epididymo-orchitis with vascular compromise) and less likely acute traumatic/vascular injury. Emergent urologic consultation is recommended. Findings were discussed with Dr. Theadore at 5:23 am 11/05/24. 2. Left hydrocele. Electronically signed by: Evalene Coho MD 11/05/2024 05:24 AM EST RP Workstation: [id number]     The results of significant diagnostics from this hospitalization (including imaging, microbiology, ancillary and laboratory) are listed below for reference.     Microbiology: Recent Results (from the past 240 hours)  MRSA Next Gen by PCR, Nasal     Status: None   Collection Time: 11/06/24 10:27 AM   Specimen: Nasal Mucosa; Nasal Swab  Result Value Ref Range Status   MRSA by PCR Next Gen NOT DETECTED NOT DETECTED Final    Comment: (NOTE) The GeneXpert MRSA Assay (FDA approved for NASAL specimens only), is one component of a comprehensive MRSA colonization surveillance program. It is not intended to diagnose MRSA infection nor to guide or monitor treatment for MRSA infections. Test performance is not FDA approved in patients less than 56 years old. Performed at [facility], [address] [place], [place] [address]      Labs: BNP (last 3 results) No results for input(s): BNP in the last 8760 hours. Basic Metabolic Panel: Recent Labs  Lab 11/05/24 0416 11/06/24 0549 11/07/24 0447 11/08/24 0632  NA 142 138 136 135  K 4.2 4.1 4.0 4.4  CL 106  105 103 102  CO2 27 26 22 23   GLUCOSE 117* 89 95 126*  BUN 19 12 13 17   CREATININE 1.02 0.93 0.92 0.84  CALCIUM  9.2 8.7* 8.9 9.1  MG  --   --   --  2.1   Liver Function Tests: Recent Labs  Lab 11/05/24 0416  AST 25  ALT 37  ALKPHOS 63  BILITOT 0.3  PROT 6.4*  ALBUMIN 4.0   Recent Labs  Lab 11/05/24 0416  LIPASE 32   No results for input(s): AMMONIA in the last 168 hours. CBC: Recent Labs  Lab 11/05/24 0416 11/06/24 0549 11/07/24 0447  WBC 13.0* 10.8* 12.6*  HGB 16.2 15.2 15.6  HCT 49.0 45.9 46.2  MCV 93.0 91.4 90.2  PLT 141* 131* 136*   Cardiac Enzymes: No results for input(s): CKTOTAL, CKMB, CKMBINDEX, TROPONINI in the last 168 hours. BNP: Invalid input(s): POCBNP CBG: No results for input(s): GLUCAP in the last 168 hours. D-Dimer No results for input(s): DDIMER in the  last 72 hours. Hgb A1c No results for input(s): HGBA1C in the last 72 hours. Lipid Profile No results for input(s): CHOL, HDL, LDLCALC, TRIG, CHOLHDL, LDLDIRECT in the last 72 hours. Thyroid  function studies No results for input(s): TSH, T4TOTAL, T3FREE, THYROIDAB in the last 72 hours.  Invalid input(s): FREET3 Anemia work up No results for input(s): VITAMINB12, FOLATE, FERRITIN, TIBC, IRON, RETICCTPCT in the last 72 hours. Urinalysis    Component Value Date/Time   COLORURINE YELLOW 06/11/2023 1458   APPEARANCEUR CLEAR 06/11/2023 1458   LABSPEC 1.015 06/11/2023 1458   PHURINE 6.0 06/11/2023 1458   GLUCOSEU NEGATIVE 06/11/2023 1458   HGBUR NEGATIVE 06/11/2023 1458   BILIRUBINUR NEGATIVE 06/11/2023 1458   KETONESUR NEGATIVE 06/11/2023 1458   PROTEINUR NEGATIVE 06/11/2023 1458   UROBILINOGEN 1.0 05/28/2015 2250   NITRITE NEGATIVE 06/11/2023 1458   LEUKOCYTESUR NEGATIVE 06/11/2023 1458   Sepsis Labs Recent Labs  Lab 11/05/24 0416 11/06/24 0549 11/07/24 0447  WBC 13.0* 10.8* 12.6*   Microbiology Recent Results (from the past  240 hours)  MRSA Next Gen by PCR, Nasal     Status: None   Collection Time: 11/06/24 10:27 AM   Specimen: Nasal Mucosa; Nasal Swab  Result Value Ref Range Status   MRSA by PCR Next Gen NOT DETECTED NOT DETECTED Final    Comment: (NOTE) The GeneXpert MRSA Assay (FDA approved for NASAL specimens only), is one component of a comprehensive MRSA colonization surveillance program. It is not intended to diagnose MRSA infection nor to guide or monitor treatment for MRSA infections. Test performance is not FDA approved in patients less than 96 years old. Performed at [facility], [address], [place], [place] [address]      Time coordinating discharge:  I have spent 35 minutes face to face with the patient and on the ward discussing the patients care, assessment, plan and disposition with other care givers. >50% of the time was devoted counseling the patient about the risks and benefits of treatment/Discharge disposition and coordinating care.   SIGNED:   Burgess JAYSON Dare, MD  [facility] 11/08/2024, 11:42 AM   If 7PM-7AM, please contact night-coverage      [1] No Known Allergies

## 2024-11-08 NOTE — Plan of Care (Signed)
 " Problem: Education: Goal: Knowledge of General Education information will improve Description: Including pain rating scale, medication(s)/side effects and non-pharmacologic comfort measures 11/08/2024 1025 by Johnie Will HERO, RN Outcome: Adequate for Discharge 11/08/2024 1025 by Johnie Will HERO, RN Outcome: Adequate for Discharge   Problem: Health Behavior/Discharge Planning: Goal: Ability to manage health-related needs will improve 11/08/2024 1025 by Johnie Will HERO, RN Outcome: Adequate for Discharge 11/08/2024 1025 by Johnie Will HERO, RN Outcome: Adequate for Discharge   Problem: Clinical Measurements: Goal: Ability to maintain clinical measurements within normal limits will improve 11/08/2024 1025 by Johnie Will HERO, RN Outcome: Adequate for Discharge 11/08/2024 1025 by Johnie Will HERO, RN Outcome: Adequate for Discharge Goal: Will remain free from infection 11/08/2024 1025 by Johnie Will HERO, RN Outcome: Adequate for Discharge 11/08/2024 1025 by Johnie Will HERO, RN Outcome: Adequate for Discharge Goal: Diagnostic test results will improve 11/08/2024 1025 by Johnie Will HERO, RN Outcome: Adequate for Discharge 11/08/2024 1025 by Johnie Will HERO, RN Outcome: Adequate for Discharge Goal: Respiratory complications will improve 11/08/2024 1025 by Johnie Will HERO, RN Outcome: Adequate for Discharge 11/08/2024 1025 by Johnie Will HERO, RN Outcome: Adequate for Discharge Goal: Cardiovascular complication will be avoided 11/08/2024 1025 by Johnie Will HERO, RN Outcome: Adequate for Discharge 11/08/2024 1025 by Johnie Will HERO, RN Outcome: Adequate for Discharge   Problem: Activity: Goal: Risk for activity intolerance will decrease 11/08/2024 1025 by Johnie Will HERO, RN Outcome: Adequate for Discharge 11/08/2024 1025 by Johnie Will HERO, RN Outcome: Adequate for Discharge   Problem: Nutrition: Goal: Adequate nutrition will be  maintained 11/08/2024 1025 by Johnie Will HERO, RN Outcome: Adequate for Discharge 11/08/2024 1025 by Johnie Will HERO, RN Outcome: Adequate for Discharge   Problem: Coping: Goal: Level of anxiety will decrease 11/08/2024 1025 by Johnie Will HERO, RN Outcome: Adequate for Discharge 11/08/2024 1025 by Johnie Will HERO, RN Outcome: Adequate for Discharge   Problem: Elimination: Goal: Will not experience complications related to bowel motility 11/08/2024 1025 by Johnie Will HERO, RN Outcome: Adequate for Discharge 11/08/2024 1025 by Johnie Will HERO, RN Outcome: Adequate for Discharge Goal: Will not experience complications related to urinary retention 11/08/2024 1025 by Johnie Will HERO, RN Outcome: Adequate for Discharge 11/08/2024 1025 by Johnie Will HERO, RN Outcome: Adequate for Discharge   Problem: Pain Managment: Goal: General experience of comfort will improve and/or be controlled 11/08/2024 1025 by Johnie Will HERO, RN Outcome: Adequate for Discharge 11/08/2024 1025 by Johnie Will HERO, RN Outcome: Adequate for Discharge   Problem: Safety: Goal: Ability to remain free from injury will improve 11/08/2024 1025 by Johnie Will HERO, RN Outcome: Adequate for Discharge 11/08/2024 1025 by Johnie Will HERO, RN Outcome: Adequate for Discharge   Problem: Skin Integrity: Goal: Risk for impaired skin integrity will decrease 11/08/2024 1025 by Johnie Will HERO, RN Outcome: Adequate for Discharge 11/08/2024 1025 by Johnie Will HERO, RN Outcome: Adequate for Discharge   Problem: Education: Goal: Knowledge of the prescribed therapeutic regimen will improve 11/08/2024 1025 by Johnie Will HERO, RN Outcome: Adequate for Discharge 11/08/2024 1025 by Johnie Will HERO, RN Outcome: Adequate for Discharge   Problem: Bowel/Gastric: Goal: Gastrointestinal status for postoperative course will improve 11/08/2024 1025 by Johnie Will HERO, RN Outcome: Adequate for  Discharge 11/08/2024 1025 by Johnie Will HERO, RN Outcome: Adequate for Discharge   Problem: Cardiac: Goal: Ability to maintain an adequate cardiac output 11/08/2024 1025 by Johnie Will HERO, RN Outcome: Adequate for Discharge 11/08/2024 1025 by Johnie Will HERO, RN  Outcome: Adequate for Discharge Goal: Will show no evidence of cardiac arrhythmias 11/08/2024 1025 by Johnie Will HERO, RN Outcome: Adequate for Discharge 11/08/2024 1025 by Johnie Will HERO, RN Outcome: Adequate for Discharge   Problem: Nutritional: Goal: Will attain and maintain optimal nutritional status 11/08/2024 1025 by Johnie Will HERO, RN Outcome: Adequate for Discharge 11/08/2024 1025 by Johnie Will HERO, RN Outcome: Adequate for Discharge   Problem: Neurological: Goal: Will regain or maintain usual level of consciousness 11/08/2024 1025 by Johnie Will HERO, RN Outcome: Adequate for Discharge 11/08/2024 1025 by Johnie Will HERO, RN Outcome: Adequate for Discharge   Problem: Clinical Measurements: Goal: Ability to maintain clinical measurements within normal limits 11/08/2024 1025 by Johnie Will HERO, RN Outcome: Adequate for Discharge 11/08/2024 1025 by Johnie Will HERO, RN Outcome: Adequate for Discharge Goal: Postoperative complications will be avoided or minimized 11/08/2024 1025 by Johnie Will HERO, RN Outcome: Adequate for Discharge 11/08/2024 1025 by Johnie Will HERO, RN Outcome: Adequate for Discharge   Problem: Respiratory: Goal: Will regain and/or maintain adequate ventilation 11/08/2024 1025 by Johnie Will HERO, RN Outcome: Adequate for Discharge 11/08/2024 1025 by Johnie Will HERO, RN Outcome: Adequate for Discharge Goal: Respiratory status will improve 11/08/2024 1025 by Johnie Will HERO, RN Outcome: Adequate for Discharge 11/08/2024 1025 by Johnie Will HERO, RN Outcome: Adequate for Discharge   Problem: Skin Integrity: Goal: Demonstrates signs of wound healing  without infection 11/08/2024 1025 by Johnie Will HERO, RN Outcome: Adequate for Discharge 11/08/2024 1025 by Johnie Will HERO, RN Outcome: Adequate for Discharge   Problem: Urinary Elimination: Goal: Will remain free from infection 11/08/2024 1025 by Johnie Will HERO, RN Outcome: Adequate for Discharge 11/08/2024 1025 by Johnie Will HERO, RN Outcome: Adequate for Discharge Goal: Ability to achieve and maintain adequate urine output 11/08/2024 1025 by Johnie Will HERO, RN Outcome: Adequate for Discharge 11/08/2024 1025 by Johnie Will HERO, RN Outcome: Adequate for Discharge   "

## 2024-11-09 ENCOUNTER — Other Ambulatory Visit (HOSPITAL_COMMUNITY): Payer: Self-pay

## 2024-11-09 LAB — SURGICAL PATHOLOGY

## 2024-11-10 ENCOUNTER — Other Ambulatory Visit (HOSPITAL_COMMUNITY): Payer: Self-pay

## 2024-11-11 ENCOUNTER — Other Ambulatory Visit (HOSPITAL_COMMUNITY): Payer: Self-pay

## 2024-11-12 ENCOUNTER — Telehealth: Payer: Self-pay

## 2024-11-12 NOTE — Transitions of Care (Post Inpatient/ED Visit) (Signed)
" ° °  11/12/2024  Name: [name] MRN: [id number] DOB: 05-18-1981  Today's TOC FU Call Status: Today's TOC FU Call Status:: Unsuccessful Call (1st Attempt) Unsuccessful Call (1st Attempt) Date: 11/12/24  Attempted to reach the patient regarding the most recent Inpatient/ED visit.  Follow Up Plan: Additional outreach attempts will be made to reach the patient to complete the Transitions of Care (Post Inpatient/ED visit) call.   The patient's phone number is the same as his mother, Lavaughn.   Signature  Slater Diesel, RN   "

## 2024-11-13 ENCOUNTER — Telehealth: Payer: Self-pay

## 2024-11-13 ENCOUNTER — Other Ambulatory Visit (HOSPITAL_COMMUNITY): Payer: Self-pay

## 2024-11-13 NOTE — Transitions of Care (Post Inpatient/ED Visit) (Signed)
" ° °  11/13/2024  Name: [name] MRN: [id number] DOB: [birth year]  Today's TOC FU Call Status: Today's TOC FU Call Status:: Unsuccessful Call (2nd Attempt) Unsuccessful Call (1st Attempt) Date: 11/12/24 Unsuccessful Call (2nd Attempt) Date: 11/13/24  Attempted to reach the patient regarding the most recent Inpatient/ED visit.  Follow Up Plan: Additional outreach attempts will be made to reach the patient to complete the Transitions of Care (Post Inpatient/ED visit) call.   Signature  Slater Diesel, RN   "

## 2024-11-14 ENCOUNTER — Telehealth: Payer: Self-pay

## 2024-11-14 NOTE — Telephone Encounter (Signed)
 Spoke with patient. Patient is aware that he has to go to the provider that preformed his surgery with question about his stitches. Patient is requesting appointment with PCP for post surgery/hospital follow-up. Appointment given

## 2024-11-14 NOTE — Telephone Encounter (Signed)
 Noted

## 2024-11-14 NOTE — Telephone Encounter (Signed)
 Copied from CRM [id number]. Topic: Clinical - Medical Advice >> Nov 14, 2024 11:03 AM [name] wrote: Reason for CRM:  He wants to know if his stitches needs to be removed. 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 Notes are in his chart dated 11/07/24. Please call him at [phone] to discuss. Thanks

## 2024-11-14 NOTE — Transitions of Care (Post Inpatient/ED Visit) (Signed)
" ° °  11/14/2024  Name: [name] MRN: [id number] DOB: 12-21-1980  Today's TOC FU Call Status: Today's TOC FU Call Status:: Unsuccessful Call (3rd Attempt) Unsuccessful Call (1st Attempt) Date: 11/12/24 Unsuccessful Call (2nd Attempt) Date: 11/13/24 Unsuccessful Call (3rd Attempt) Date: 11/14/24  Attempted to reach the patient regarding the most recent Inpatient/ED visit.  Follow Up Plan: No further outreach attempts will be made at this time. We have been unable to contact the patient.  I spoke to [name] who said she would have him call me. He doesn't have a phone.    [name] is listed as PCP but he has not seen her since 12/2020.    Letter sent to patient requesting he contact [facility] to schedule a follow up appointment as we have not been able to reach him   Signature Slater Diesel, RN   "

## 2024-11-28 ENCOUNTER — Ambulatory Visit (INDEPENDENT_AMBULATORY_CARE_PROVIDER_SITE_OTHER): Payer: Self-pay | Admitting: Primary Care

## 2024-11-28 ENCOUNTER — Telehealth (INDEPENDENT_AMBULATORY_CARE_PROVIDER_SITE_OTHER): Payer: Self-pay | Admitting: Primary Care

## 2024-11-28 NOTE — Telephone Encounter (Signed)
 Left VM with pt about their upcoming appt. Pt did not answer

## 2024-11-29 ENCOUNTER — Ambulatory Visit (INDEPENDENT_AMBULATORY_CARE_PROVIDER_SITE_OTHER): Payer: MEDICAID | Admitting: Primary Care

## 2024-12-17 ENCOUNTER — Inpatient Hospital Stay: Payer: Self-pay | Admitting: Primary Care
